# Patient Record
Sex: Male | Born: 1960 | Race: White | Hispanic: No | Marital: Single | Smoking: Former smoker
Health system: Southern US, Community
[De-identification: ages and names within clinical notes are randomized; demographics above are authoritative.]

## PROBLEM LIST (undated history)

## (undated) DIAGNOSIS — J45909 Unspecified asthma, uncomplicated: Secondary | ICD-10-CM

## (undated) DIAGNOSIS — D45 Polycythemia vera: Secondary | ICD-10-CM

## (undated) DIAGNOSIS — I10 Essential (primary) hypertension: Secondary | ICD-10-CM

## (undated) DIAGNOSIS — M48061 Spinal stenosis, lumbar region without neurogenic claudication: Secondary | ICD-10-CM

## (undated) DIAGNOSIS — E669 Obesity, unspecified: Secondary | ICD-10-CM

## (undated) DIAGNOSIS — E785 Hyperlipidemia, unspecified: Secondary | ICD-10-CM

## (undated) DIAGNOSIS — F32A Depression, unspecified: Secondary | ICD-10-CM

## (undated) DIAGNOSIS — R51 Headache: Secondary | ICD-10-CM

## (undated) DIAGNOSIS — G4733 Obstructive sleep apnea (adult) (pediatric): Secondary | ICD-10-CM

## (undated) DIAGNOSIS — F329 Major depressive disorder, single episode, unspecified: Secondary | ICD-10-CM

## (undated) HISTORY — DX: Headache: R51

## (undated) HISTORY — DX: Unspecified asthma, uncomplicated: J45.909

## (undated) HISTORY — DX: Essential (primary) hypertension: I10

## (undated) HISTORY — DX: Obesity, unspecified: E66.9

## (undated) HISTORY — DX: Hyperlipidemia, unspecified: E78.5

## (undated) HISTORY — DX: Obstructive sleep apnea (adult) (pediatric): G47.33

## (undated) HISTORY — DX: Polycythemia vera: D45

## (undated) HISTORY — DX: Major depressive disorder, single episode, unspecified: F32.9

## (undated) HISTORY — DX: Depression, unspecified: F32.A

## (undated) HISTORY — DX: Spinal stenosis, lumbar region without neurogenic claudication: M48.061

---

## 2000-08-31 ENCOUNTER — Encounter: Payer: Self-pay | Admitting: Family Medicine

## 2000-08-31 ENCOUNTER — Encounter: Admission: RE | Admit: 2000-08-31 | Discharge: 2000-08-31 | Payer: Self-pay | Admitting: Family Medicine

## 2001-03-17 ENCOUNTER — Encounter: Payer: Self-pay | Admitting: Occupational Medicine

## 2001-03-17 ENCOUNTER — Encounter: Admission: RE | Admit: 2001-03-17 | Discharge: 2001-03-17 | Payer: Self-pay | Admitting: Occupational Medicine

## 2001-04-27 ENCOUNTER — Encounter: Payer: Self-pay | Admitting: Orthopedic Surgery

## 2001-04-27 ENCOUNTER — Ambulatory Visit (HOSPITAL_COMMUNITY): Admission: RE | Admit: 2001-04-27 | Discharge: 2001-04-27 | Payer: Self-pay | Admitting: Orthopedic Surgery

## 2001-08-03 ENCOUNTER — Encounter: Payer: Self-pay | Admitting: Occupational Medicine

## 2001-08-03 ENCOUNTER — Encounter: Admission: RE | Admit: 2001-08-03 | Discharge: 2001-08-03 | Payer: Self-pay | Admitting: Occupational Medicine

## 2003-08-09 ENCOUNTER — Emergency Department (HOSPITAL_COMMUNITY): Admission: EM | Admit: 2003-08-09 | Discharge: 2003-08-09 | Payer: Self-pay | Admitting: Family Medicine

## 2003-12-22 ENCOUNTER — Emergency Department (HOSPITAL_COMMUNITY): Admission: EM | Admit: 2003-12-22 | Discharge: 2003-12-22 | Payer: Self-pay

## 2004-01-14 ENCOUNTER — Ambulatory Visit: Payer: Self-pay | Admitting: Internal Medicine

## 2004-01-30 ENCOUNTER — Ambulatory Visit: Payer: Self-pay | Admitting: Internal Medicine

## 2004-02-04 ENCOUNTER — Ambulatory Visit: Payer: Self-pay | Admitting: Internal Medicine

## 2004-02-04 ENCOUNTER — Encounter: Admission: RE | Admit: 2004-02-04 | Discharge: 2004-05-04 | Payer: Self-pay | Admitting: Infectious Diseases

## 2004-03-10 ENCOUNTER — Ambulatory Visit: Payer: Self-pay | Admitting: Internal Medicine

## 2004-03-13 ENCOUNTER — Ambulatory Visit (HOSPITAL_COMMUNITY): Admission: RE | Admit: 2004-03-13 | Discharge: 2004-03-13 | Payer: Self-pay | Admitting: *Deleted

## 2004-04-08 ENCOUNTER — Ambulatory Visit: Payer: Self-pay | Admitting: Internal Medicine

## 2004-04-22 ENCOUNTER — Encounter: Admission: RE | Admit: 2004-04-22 | Discharge: 2004-04-22 | Payer: Self-pay | Admitting: Internal Medicine

## 2004-05-21 ENCOUNTER — Ambulatory Visit: Payer: Self-pay | Admitting: Internal Medicine

## 2004-10-19 ENCOUNTER — Ambulatory Visit: Payer: Self-pay | Admitting: Hospitalist

## 2005-04-01 ENCOUNTER — Ambulatory Visit: Payer: Self-pay | Admitting: Hospitalist

## 2005-04-01 ENCOUNTER — Ambulatory Visit (HOSPITAL_COMMUNITY): Admission: RE | Admit: 2005-04-01 | Discharge: 2005-04-01 | Payer: Self-pay | Admitting: Hospitalist

## 2005-04-26 ENCOUNTER — Ambulatory Visit: Payer: Self-pay | Admitting: Internal Medicine

## 2005-08-06 ENCOUNTER — Ambulatory Visit: Payer: Self-pay | Admitting: Internal Medicine

## 2005-11-22 DIAGNOSIS — J45909 Unspecified asthma, uncomplicated: Secondary | ICD-10-CM | POA: Insufficient documentation

## 2005-11-22 DIAGNOSIS — Z862 Personal history of diseases of the blood and blood-forming organs and certain disorders involving the immune mechanism: Secondary | ICD-10-CM

## 2005-11-22 DIAGNOSIS — M542 Cervicalgia: Secondary | ICD-10-CM

## 2005-11-22 DIAGNOSIS — I10 Essential (primary) hypertension: Secondary | ICD-10-CM | POA: Insufficient documentation

## 2006-06-23 ENCOUNTER — Ambulatory Visit: Payer: Self-pay | Admitting: Internal Medicine

## 2006-06-23 ENCOUNTER — Encounter (INDEPENDENT_AMBULATORY_CARE_PROVIDER_SITE_OTHER): Payer: Self-pay | Admitting: *Deleted

## 2006-06-23 DIAGNOSIS — K219 Gastro-esophageal reflux disease without esophagitis: Secondary | ICD-10-CM

## 2006-07-29 ENCOUNTER — Ambulatory Visit: Payer: Self-pay | Admitting: *Deleted

## 2006-07-29 ENCOUNTER — Ambulatory Visit (HOSPITAL_COMMUNITY): Admission: RE | Admit: 2006-07-29 | Discharge: 2006-07-29 | Payer: Self-pay | Admitting: *Deleted

## 2006-07-29 ENCOUNTER — Encounter (INDEPENDENT_AMBULATORY_CARE_PROVIDER_SITE_OTHER): Payer: Self-pay | Admitting: *Deleted

## 2006-08-12 ENCOUNTER — Ambulatory Visit: Payer: Self-pay | Admitting: Internal Medicine

## 2006-08-12 DIAGNOSIS — M2460 Ankylosis, unspecified joint: Secondary | ICD-10-CM

## 2006-08-17 ENCOUNTER — Telehealth: Payer: Self-pay | Admitting: *Deleted

## 2006-08-31 ENCOUNTER — Encounter (INDEPENDENT_AMBULATORY_CARE_PROVIDER_SITE_OTHER): Payer: Self-pay | Admitting: *Deleted

## 2006-08-31 ENCOUNTER — Encounter: Admission: RE | Admit: 2006-08-31 | Discharge: 2006-08-31 | Payer: Self-pay | Admitting: Internal Medicine

## 2006-09-26 ENCOUNTER — Telehealth: Payer: Self-pay | Admitting: *Deleted

## 2006-09-26 ENCOUNTER — Encounter (INDEPENDENT_AMBULATORY_CARE_PROVIDER_SITE_OTHER): Payer: Self-pay | Admitting: *Deleted

## 2006-10-21 ENCOUNTER — Encounter (INDEPENDENT_AMBULATORY_CARE_PROVIDER_SITE_OTHER): Payer: Self-pay | Admitting: *Deleted

## 2006-11-10 ENCOUNTER — Telehealth (INDEPENDENT_AMBULATORY_CARE_PROVIDER_SITE_OTHER): Payer: Self-pay | Admitting: *Deleted

## 2006-11-11 ENCOUNTER — Encounter (INDEPENDENT_AMBULATORY_CARE_PROVIDER_SITE_OTHER): Payer: Self-pay | Admitting: *Deleted

## 2007-02-27 ENCOUNTER — Encounter (INDEPENDENT_AMBULATORY_CARE_PROVIDER_SITE_OTHER): Payer: Self-pay | Admitting: *Deleted

## 2007-02-27 ENCOUNTER — Ambulatory Visit: Payer: Self-pay | Admitting: Internal Medicine

## 2007-03-06 ENCOUNTER — Encounter: Payer: Self-pay | Admitting: Internal Medicine

## 2007-03-06 ENCOUNTER — Ambulatory Visit (HOSPITAL_COMMUNITY): Admission: RE | Admit: 2007-03-06 | Discharge: 2007-03-06 | Payer: Self-pay | Admitting: Hospitalist

## 2007-03-06 LAB — CONVERTED CEMR LAB
AST: 18 units/L (ref 0–37)
Albumin: 4.5 g/dL (ref 3.5–5.2)
Basophils Relative: 0 % (ref 0–1)
Calcium: 9.5 mg/dL (ref 8.4–10.5)
Creatinine, Urine: 229.8 mg/dL
Hemoglobin, Urine: NEGATIVE
Hemoglobin: 18.3 g/dL — ABNORMAL HIGH (ref 13.0–17.0)
Ketones, ur: NEGATIVE mg/dL
Leukocytes, UA: NEGATIVE
Lymphocytes Relative: 30 % (ref 12–46)
Microalb Creat Ratio: 4.4 mg/g (ref 0.0–30.0)
Monocytes Absolute: 0.5 10*3/uL (ref 0.1–1.0)
Monocytes Relative: 6 % (ref 3–12)
Neutro Abs: 4.6 10*3/uL (ref 1.7–7.7)
Neutrophils Relative %: 61 % (ref 43–77)
Nitrite: NEGATIVE
Platelets: 243 10*3/uL (ref 150–400)
Protein, ur: NEGATIVE mg/dL
RBC: 6.21 M/uL — ABNORMAL HIGH (ref 4.22–5.81)
RDW: 13.8 % (ref 11.5–15.5)
Specific Gravity, Urine: 1.022 (ref 1.005–1.03)
TSH: 0.677 microintl units/mL (ref 0.350–5.50)
Urine Glucose: NEGATIVE mg/dL
Urobilinogen, UA: 0.2 (ref 0.0–1.0)
WBC: 7.6 10*3/uL (ref 4.0–10.5)

## 2007-03-08 ENCOUNTER — Ambulatory Visit (HOSPITAL_BASED_OUTPATIENT_CLINIC_OR_DEPARTMENT_OTHER): Admission: RE | Admit: 2007-03-08 | Discharge: 2007-03-08 | Payer: Self-pay | Admitting: *Deleted

## 2007-03-11 ENCOUNTER — Ambulatory Visit: Payer: Self-pay | Admitting: Internal Medicine

## 2007-03-15 ENCOUNTER — Ambulatory Visit: Payer: Self-pay | Admitting: Internal Medicine

## 2007-04-03 ENCOUNTER — Encounter (INDEPENDENT_AMBULATORY_CARE_PROVIDER_SITE_OTHER): Payer: Self-pay | Admitting: *Deleted

## 2007-04-03 ENCOUNTER — Ambulatory Visit: Payer: Self-pay | Admitting: Hospitalist

## 2007-04-03 DIAGNOSIS — G4733 Obstructive sleep apnea (adult) (pediatric): Secondary | ICD-10-CM | POA: Insufficient documentation

## 2007-04-10 ENCOUNTER — Ambulatory Visit (HOSPITAL_BASED_OUTPATIENT_CLINIC_OR_DEPARTMENT_OTHER): Admission: RE | Admit: 2007-04-10 | Discharge: 2007-04-10 | Payer: Self-pay | Admitting: *Deleted

## 2007-04-11 ENCOUNTER — Telehealth: Payer: Self-pay | Admitting: *Deleted

## 2007-04-16 ENCOUNTER — Ambulatory Visit: Payer: Self-pay | Admitting: Internal Medicine

## 2007-04-18 ENCOUNTER — Ambulatory Visit: Payer: Self-pay | Admitting: Internal Medicine

## 2007-05-04 ENCOUNTER — Encounter (INDEPENDENT_AMBULATORY_CARE_PROVIDER_SITE_OTHER): Payer: Self-pay | Admitting: *Deleted

## 2007-05-11 ENCOUNTER — Encounter (INDEPENDENT_AMBULATORY_CARE_PROVIDER_SITE_OTHER): Payer: Self-pay | Admitting: *Deleted

## 2007-05-11 ENCOUNTER — Ambulatory Visit: Payer: Self-pay | Admitting: Infectious Disease

## 2007-05-11 DIAGNOSIS — F319 Bipolar disorder, unspecified: Secondary | ICD-10-CM | POA: Insufficient documentation

## 2007-05-26 ENCOUNTER — Ambulatory Visit: Payer: Self-pay | Admitting: Infectious Disease

## 2007-05-26 DIAGNOSIS — T148XXA Other injury of unspecified body region, initial encounter: Secondary | ICD-10-CM | POA: Insufficient documentation

## 2007-06-07 ENCOUNTER — Encounter (INDEPENDENT_AMBULATORY_CARE_PROVIDER_SITE_OTHER): Payer: Self-pay | Admitting: *Deleted

## 2007-06-07 ENCOUNTER — Ambulatory Visit: Payer: Self-pay | Admitting: *Deleted

## 2007-06-07 DIAGNOSIS — R42 Dizziness and giddiness: Secondary | ICD-10-CM | POA: Insufficient documentation

## 2007-06-07 LAB — CONVERTED CEMR LAB
Basophils Relative: 0 % (ref 0–1)
Eosinophils Relative: 3 % (ref 0–5)
HCT: 51.7 % (ref 39.0–52.0)
Lymphocytes Relative: 28 % (ref 12–46)
Lymphs Abs: 2.2 10*3/uL (ref 0.7–4.0)
MCV: 87.2 fL (ref 78.0–100.0)
Monocytes Relative: 8 % (ref 3–12)
Neutrophils Relative %: 61 % (ref 43–77)
RDW: 13.9 % (ref 11.5–15.5)
WBC: 7.9 10*3/uL (ref 4.0–10.5)

## 2007-06-15 ENCOUNTER — Encounter: Admission: RE | Admit: 2007-06-15 | Discharge: 2007-07-12 | Payer: Self-pay | Admitting: *Deleted

## 2007-06-26 ENCOUNTER — Encounter (INDEPENDENT_AMBULATORY_CARE_PROVIDER_SITE_OTHER): Payer: Self-pay | Admitting: *Deleted

## 2007-06-29 ENCOUNTER — Encounter (INDEPENDENT_AMBULATORY_CARE_PROVIDER_SITE_OTHER): Payer: Self-pay | Admitting: *Deleted

## 2007-07-06 ENCOUNTER — Ambulatory Visit: Payer: Self-pay | Admitting: Internal Medicine

## 2007-07-06 ENCOUNTER — Encounter (INDEPENDENT_AMBULATORY_CARE_PROVIDER_SITE_OTHER): Payer: Self-pay | Admitting: *Deleted

## 2007-07-12 ENCOUNTER — Encounter (INDEPENDENT_AMBULATORY_CARE_PROVIDER_SITE_OTHER): Payer: Self-pay | Admitting: Internal Medicine

## 2007-08-17 ENCOUNTER — Telehealth: Payer: Self-pay | Admitting: *Deleted

## 2007-08-29 ENCOUNTER — Encounter: Payer: Self-pay | Admitting: Internal Medicine

## 2007-08-29 ENCOUNTER — Ambulatory Visit: Payer: Self-pay | Admitting: Internal Medicine

## 2007-08-29 DIAGNOSIS — R300 Dysuria: Secondary | ICD-10-CM | POA: Insufficient documentation

## 2007-08-29 LAB — CONVERTED CEMR LAB
Bilirubin Urine: NEGATIVE
Chlamydia, Swab/Urine, PCR: NEGATIVE
Nitrite: NEGATIVE
Protein, ur: NEGATIVE mg/dL

## 2007-09-12 ENCOUNTER — Encounter: Payer: Self-pay | Admitting: Internal Medicine

## 2007-09-12 ENCOUNTER — Ambulatory Visit: Payer: Self-pay | Admitting: Internal Medicine

## 2007-09-12 LAB — CONVERTED CEMR LAB
Bilirubin Urine: NEGATIVE
Ketones, ur: NEGATIVE mg/dL
Leukocytes, UA: NEGATIVE
Protein, ur: NEGATIVE mg/dL
Specific Gravity, Urine: 1.014 (ref 1.005–1.03)
pH: 7.5 (ref 5.0–8.0)

## 2007-09-13 ENCOUNTER — Encounter: Payer: Self-pay | Admitting: Internal Medicine

## 2007-09-19 ENCOUNTER — Encounter (INDEPENDENT_AMBULATORY_CARE_PROVIDER_SITE_OTHER): Payer: Self-pay | Admitting: *Deleted

## 2007-09-26 ENCOUNTER — Ambulatory Visit: Payer: Self-pay | Admitting: Internal Medicine

## 2007-09-28 ENCOUNTER — Emergency Department (HOSPITAL_BASED_OUTPATIENT_CLINIC_OR_DEPARTMENT_OTHER): Admission: EM | Admit: 2007-09-28 | Discharge: 2007-09-29 | Payer: Self-pay | Admitting: Emergency Medicine

## 2007-09-29 ENCOUNTER — Encounter (INDEPENDENT_AMBULATORY_CARE_PROVIDER_SITE_OTHER): Payer: Self-pay | Admitting: *Deleted

## 2007-10-02 ENCOUNTER — Telehealth (INDEPENDENT_AMBULATORY_CARE_PROVIDER_SITE_OTHER): Payer: Self-pay | Admitting: *Deleted

## 2007-10-18 ENCOUNTER — Ambulatory Visit: Payer: Self-pay | Admitting: *Deleted

## 2007-10-18 ENCOUNTER — Ambulatory Visit (HOSPITAL_COMMUNITY): Admission: RE | Admit: 2007-10-18 | Discharge: 2007-10-18 | Payer: Self-pay | Admitting: Infectious Disease

## 2007-11-21 ENCOUNTER — Ambulatory Visit: Payer: Self-pay | Admitting: *Deleted

## 2007-11-21 DIAGNOSIS — R1011 Right upper quadrant pain: Secondary | ICD-10-CM

## 2008-01-09 ENCOUNTER — Encounter (INDEPENDENT_AMBULATORY_CARE_PROVIDER_SITE_OTHER): Payer: Self-pay | Admitting: *Deleted

## 2008-02-27 ENCOUNTER — Encounter (INDEPENDENT_AMBULATORY_CARE_PROVIDER_SITE_OTHER): Payer: Self-pay | Admitting: *Deleted

## 2008-02-27 ENCOUNTER — Ambulatory Visit: Payer: Self-pay | Admitting: Internal Medicine

## 2008-02-27 ENCOUNTER — Observation Stay (HOSPITAL_COMMUNITY): Admission: AD | Admit: 2008-02-27 | Discharge: 2008-02-29 | Payer: Self-pay | Admitting: Infectious Disease

## 2008-02-27 ENCOUNTER — Encounter (INDEPENDENT_AMBULATORY_CARE_PROVIDER_SITE_OTHER): Payer: Self-pay | Admitting: Internal Medicine

## 2008-02-27 ENCOUNTER — Ambulatory Visit: Payer: Self-pay | Admitting: Infectious Disease

## 2008-02-27 DIAGNOSIS — R079 Chest pain, unspecified: Secondary | ICD-10-CM

## 2008-02-27 DIAGNOSIS — M79609 Pain in unspecified limb: Secondary | ICD-10-CM

## 2008-02-28 ENCOUNTER — Encounter (INDEPENDENT_AMBULATORY_CARE_PROVIDER_SITE_OTHER): Payer: Self-pay | Admitting: *Deleted

## 2008-02-28 ENCOUNTER — Encounter: Payer: Self-pay | Admitting: *Deleted

## 2008-02-28 ENCOUNTER — Ambulatory Visit: Payer: Self-pay | Admitting: Vascular Surgery

## 2008-02-28 ENCOUNTER — Encounter: Payer: Self-pay | Admitting: Infectious Disease

## 2008-02-28 LAB — CONVERTED CEMR LAB
HDL: 27 mg/dL
LDL Cholesterol: 151 mg/dL
Triglycerides: 127 mg/dL

## 2008-02-29 ENCOUNTER — Encounter (INDEPENDENT_AMBULATORY_CARE_PROVIDER_SITE_OTHER): Payer: Self-pay | Admitting: *Deleted

## 2008-03-08 ENCOUNTER — Encounter: Payer: Self-pay | Admitting: *Deleted

## 2008-03-14 ENCOUNTER — Encounter (INDEPENDENT_AMBULATORY_CARE_PROVIDER_SITE_OTHER): Payer: Self-pay | Admitting: *Deleted

## 2008-03-14 ENCOUNTER — Ambulatory Visit: Payer: Self-pay | Admitting: *Deleted

## 2008-03-14 DIAGNOSIS — E785 Hyperlipidemia, unspecified: Secondary | ICD-10-CM | POA: Insufficient documentation

## 2008-03-15 LAB — CONVERTED CEMR LAB
AST: 24 units/L (ref 0–37)
Albumin: 4.2 g/dL (ref 3.5–5.2)
Alkaline Phosphatase: 113 units/L (ref 39–117)
CO2: 23 meq/L (ref 19–32)
Creatinine, Ser: 0.89 mg/dL (ref 0.40–1.50)
Free T4: 1.02 ng/dL (ref 0.89–1.80)
Potassium: 4.3 meq/L (ref 3.5–5.3)
RDW: 13.9 % (ref 11.5–15.5)
Total Bilirubin: 0.8 mg/dL (ref 0.3–1.2)
Total Protein: 6.8 g/dL (ref 6.0–8.3)
WBC: 8.2 10*3/uL (ref 4.0–10.5)

## 2008-03-26 ENCOUNTER — Encounter: Admission: RE | Admit: 2008-03-26 | Discharge: 2008-06-06 | Payer: Self-pay | Admitting: *Deleted

## 2008-03-31 ENCOUNTER — Emergency Department (HOSPITAL_BASED_OUTPATIENT_CLINIC_OR_DEPARTMENT_OTHER): Admission: EM | Admit: 2008-03-31 | Discharge: 2008-03-31 | Payer: Self-pay | Admitting: Emergency Medicine

## 2008-04-04 ENCOUNTER — Encounter (INDEPENDENT_AMBULATORY_CARE_PROVIDER_SITE_OTHER): Payer: Self-pay | Admitting: *Deleted

## 2008-04-24 ENCOUNTER — Ambulatory Visit: Payer: Self-pay | Admitting: *Deleted

## 2008-05-01 ENCOUNTER — Ambulatory Visit: Payer: Self-pay | Admitting: Radiology

## 2008-05-01 ENCOUNTER — Emergency Department (HOSPITAL_BASED_OUTPATIENT_CLINIC_OR_DEPARTMENT_OTHER): Admission: EM | Admit: 2008-05-01 | Discharge: 2008-05-01 | Payer: Self-pay | Admitting: Emergency Medicine

## 2008-05-09 ENCOUNTER — Ambulatory Visit: Payer: Self-pay | Admitting: Infectious Disease

## 2008-05-09 DIAGNOSIS — R7989 Other specified abnormal findings of blood chemistry: Secondary | ICD-10-CM | POA: Insufficient documentation

## 2008-05-20 ENCOUNTER — Encounter (INDEPENDENT_AMBULATORY_CARE_PROVIDER_SITE_OTHER): Payer: Self-pay | Admitting: *Deleted

## 2008-05-24 ENCOUNTER — Ambulatory Visit: Payer: Self-pay | Admitting: Internal Medicine

## 2008-05-27 ENCOUNTER — Ambulatory Visit: Payer: Self-pay | Admitting: Infectious Disease

## 2008-05-27 ENCOUNTER — Encounter (INDEPENDENT_AMBULATORY_CARE_PROVIDER_SITE_OTHER): Payer: Self-pay | Admitting: Internal Medicine

## 2008-05-27 LAB — CONVERTED CEMR LAB
ALT: 25 units/L (ref 0–53)
AST: 17 units/L (ref 0–37)
Albumin: 4 g/dL (ref 3.5–5.2)
Alkaline Phosphatase: 118 units/L — ABNORMAL HIGH (ref 39–117)
BUN: 11 mg/dL (ref 6–23)
CO2: 25 meq/L (ref 19–32)
Calcium: 9.1 mg/dL (ref 8.4–10.5)
Chloride: 106 meq/L (ref 96–112)
Cholesterol: 130 mg/dL (ref 0–200)
Creatinine, Ser: 1.1 mg/dL (ref 0.40–1.50)
GFR calc Af Amer: >60 mL/min (ref >60)
GFR calc non Af Amer: >60 mL/min (ref >60)
Glucose, Bld: 100 mg/dL — ABNORMAL HIGH (ref 70–99)
HDL: 32 mg/dL — ABNORMAL LOW (ref >39)
LDL Cholesterol: 75 mg/dL (ref 0–99)
Potassium: 4.2 meq/L (ref 3.5–5.3)
Sodium: 142 meq/L (ref 135–145)
Total Bilirubin: 0.8 mg/dL (ref 0.3–1.2)
Total CHOL/HDL Ratio: 4.1
Total Protein: 6.6 g/dL (ref 6.0–8.3)
Triglycerides: 117 mg/dL (ref <150)
Uric Acid, Serum: 5.3 mg/dL (ref 4.0–7.8)
VLDL: 23 mg/dL (ref 0–40)

## 2008-05-28 ENCOUNTER — Encounter (INDEPENDENT_AMBULATORY_CARE_PROVIDER_SITE_OTHER): Payer: Self-pay | Admitting: Internal Medicine

## 2008-08-16 ENCOUNTER — Emergency Department (HOSPITAL_BASED_OUTPATIENT_CLINIC_OR_DEPARTMENT_OTHER): Admission: EM | Admit: 2008-08-16 | Discharge: 2008-08-16 | Payer: Self-pay | Admitting: Emergency Medicine

## 2008-10-14 ENCOUNTER — Ambulatory Visit: Payer: Self-pay | Admitting: Internal Medicine

## 2008-10-15 ENCOUNTER — Encounter: Admission: RE | Admit: 2008-10-15 | Discharge: 2008-10-15 | Payer: Self-pay | Admitting: Internal Medicine

## 2008-10-17 DIAGNOSIS — M48061 Spinal stenosis, lumbar region without neurogenic claudication: Secondary | ICD-10-CM

## 2008-10-22 ENCOUNTER — Encounter: Payer: Self-pay | Admitting: Internal Medicine

## 2008-11-12 ENCOUNTER — Encounter: Payer: Self-pay | Admitting: Internal Medicine

## 2008-11-13 ENCOUNTER — Encounter: Payer: Self-pay | Admitting: Internal Medicine

## 2008-12-11 ENCOUNTER — Encounter
Admission: RE | Admit: 2008-12-11 | Discharge: 2009-01-07 | Payer: Self-pay | Admitting: Physical Medicine & Rehabilitation

## 2008-12-12 ENCOUNTER — Ambulatory Visit: Payer: Self-pay | Admitting: Physical Medicine & Rehabilitation

## 2008-12-18 ENCOUNTER — Ambulatory Visit: Payer: Self-pay | Admitting: Internal Medicine

## 2008-12-18 DIAGNOSIS — R109 Unspecified abdominal pain: Secondary | ICD-10-CM | POA: Insufficient documentation

## 2008-12-18 LAB — CONVERTED CEMR LAB
ALT: 38 units/L (ref 0–53)
AST: 23 units/L (ref 0–37)
BUN: 11 mg/dL (ref 6–23)
CO2: 25 meq/L (ref 19–32)
Chloride: 104 meq/L (ref 96–112)
Glucose, Bld: 111 mg/dL — ABNORMAL HIGH (ref 70–99)
Hemoglobin, Urine: NEGATIVE
Ketones, ur: NEGATIVE mg/dL
LDL Cholesterol: 84 mg/dL (ref 0–99)
Leukocytes, UA: NEGATIVE
Nitrite: NEGATIVE
Potassium: 3.9 meq/L (ref 3.5–5.3)
Sodium: 140 meq/L (ref 135–145)
Specific Gravity, Urine: 1.014 (ref 1.005–1.0)
Total CHOL/HDL Ratio: 4.2
Total Protein: 7.1 g/dL (ref 6.0–8.3)
Triglycerides: 168 mg/dL — ABNORMAL HIGH (ref <150)
pH: 5 (ref 5.0–8.0)

## 2008-12-19 ENCOUNTER — Encounter: Payer: Self-pay | Admitting: Internal Medicine

## 2008-12-24 ENCOUNTER — Encounter
Admission: RE | Admit: 2008-12-24 | Discharge: 2009-01-09 | Payer: Self-pay | Admitting: Physical Medicine & Rehabilitation

## 2009-01-11 HISTORY — PX: COLONOSCOPY: SHX174

## 2009-01-13 ENCOUNTER — Encounter
Admission: RE | Admit: 2009-01-13 | Discharge: 2009-01-31 | Payer: Self-pay | Admitting: Physical Medicine & Rehabilitation

## 2009-02-10 ENCOUNTER — Encounter
Admission: RE | Admit: 2009-02-10 | Discharge: 2009-05-09 | Payer: Self-pay | Admitting: Physical Medicine & Rehabilitation

## 2009-02-11 ENCOUNTER — Ambulatory Visit: Payer: Self-pay | Admitting: Physical Medicine & Rehabilitation

## 2009-03-13 ENCOUNTER — Ambulatory Visit: Payer: Self-pay | Admitting: Internal Medicine

## 2009-03-17 ENCOUNTER — Encounter: Payer: Self-pay | Admitting: Internal Medicine

## 2009-03-20 ENCOUNTER — Telehealth: Payer: Self-pay | Admitting: Internal Medicine

## 2009-05-09 ENCOUNTER — Ambulatory Visit: Payer: Self-pay | Admitting: Physical Medicine & Rehabilitation

## 2009-05-13 ENCOUNTER — Ambulatory Visit: Payer: Self-pay | Admitting: Infectious Disease

## 2009-05-13 ENCOUNTER — Telehealth: Payer: Self-pay | Admitting: *Deleted

## 2009-05-13 LAB — CONVERTED CEMR LAB
ALT: 34 units/L (ref 0–53)
AST: 23 units/L (ref 0–37)
Albumin: 4.1 g/dL (ref 3.5–5.2)
Alkaline Phosphatase: 113 units/L (ref 39–117)
BUN: 9 mg/dL (ref 6–23)
CO2: 24 meq/L (ref 19–32)
Calcium: 9.1 mg/dL (ref 8.4–10.5)
Chloride: 105 meq/L (ref 96–112)
Cholesterol: 127 mg/dL (ref 0–200)
Creatinine, Ser: 1.1 mg/dL (ref 0.40–1.50)
Glucose, Bld: 101 mg/dL — ABNORMAL HIGH (ref 70–99)
HCT: 49.1 % (ref 39.0–52.0)
HDL: 35 mg/dL — ABNORMAL LOW (ref >39)
Hemoglobin: 16.9 g/dL (ref 13.0–17.0)
LDL Cholesterol: 68 mg/dL (ref 0–99)
MCHC: 34.4 g/dL (ref 30.0–36.0)
MCV: 86.1 fL (ref >=78.0)
Platelets: 222 10*3/uL (ref 150–400)
Potassium: 3.7 meq/L (ref 3.5–5.3)
RBC: 5.7 M/uL (ref 4.22–5.81)
RDW: 14.6 % (ref 11.5–15.5)
Sodium: 140 meq/L (ref 135–145)
Total Bilirubin: 0.7 mg/dL (ref 0.3–1.2)
Total CHOL/HDL Ratio: 3.6
Total Protein: 6.5 g/dL (ref 6.0–8.3)
Triglycerides: 119 mg/dL (ref <150)
VLDL: 24 mg/dL (ref 0–40)
WBC: 8.3 10*3/uL (ref 4.0–10.5)

## 2009-05-19 ENCOUNTER — Telehealth: Payer: Self-pay | Admitting: Internal Medicine

## 2009-05-27 ENCOUNTER — Encounter: Payer: Self-pay | Admitting: Internal Medicine

## 2009-05-28 ENCOUNTER — Encounter: Admission: RE | Admit: 2009-05-28 | Discharge: 2009-05-28 | Payer: Self-pay | Admitting: Gastroenterology

## 2009-05-28 ENCOUNTER — Encounter: Payer: Self-pay | Admitting: Internal Medicine

## 2009-06-12 ENCOUNTER — Encounter: Payer: Self-pay | Admitting: Internal Medicine

## 2009-06-30 IMAGING — CR DG CHEST 2V
2 series · 2 of 2 positions shown · non-contrast
Comparison: 03/15/2007

CLINICAL DATA: Left sided numbness

CHEST - 2 VIEW

[w chest pa]
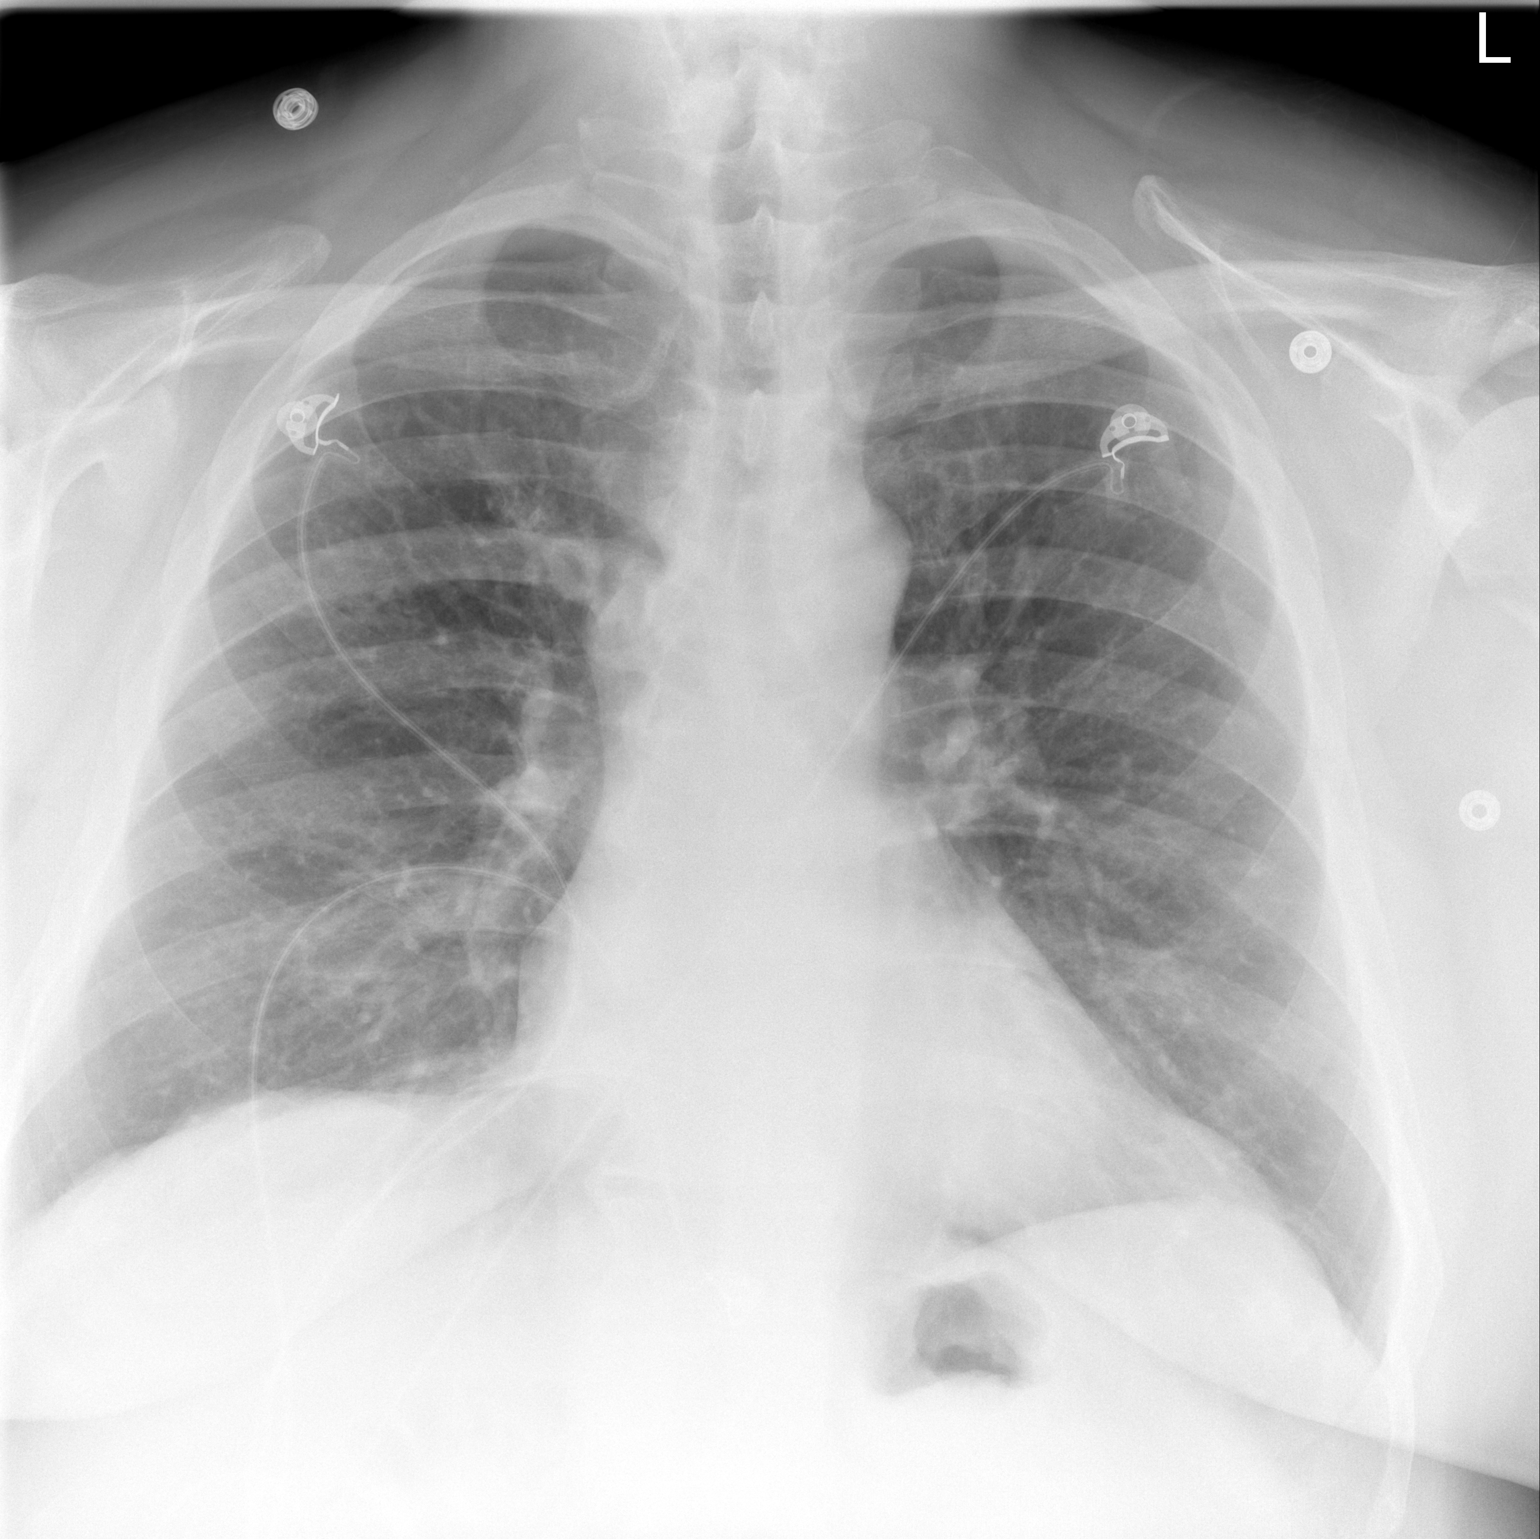

[w chest lat]
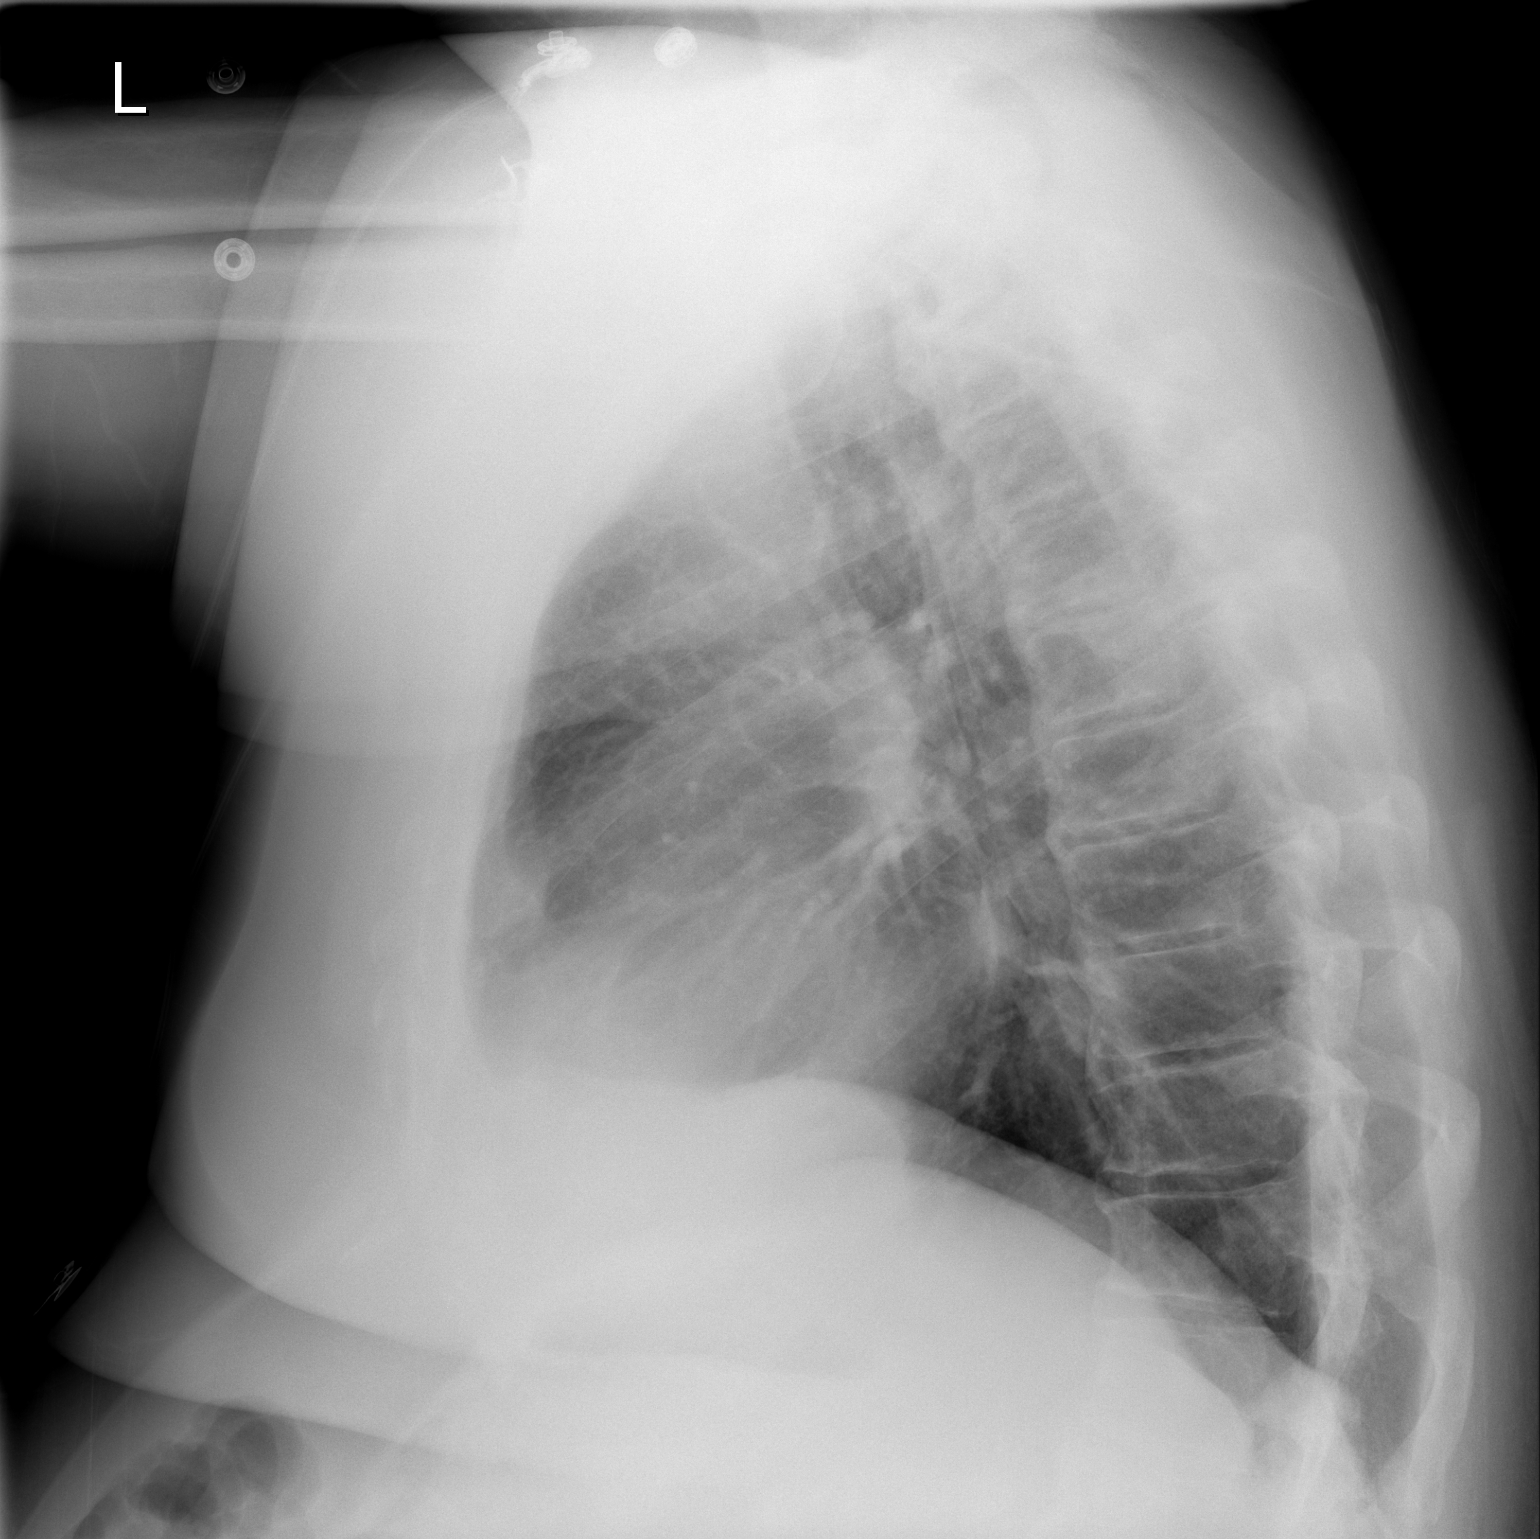

[2 of 2 positions shown; findings below may reference images not displayed]

FINDINGS: The heart size and mediastinal contours are within normal
limits.  Both lungs are clear.  The visualized skeletal structures
are unremarkable.
IMPRESSION: 1.  No acute findings.

## 2009-07-02 ENCOUNTER — Encounter
Admission: RE | Admit: 2009-07-02 | Discharge: 2009-09-30 | Payer: Self-pay | Admitting: Physical Medicine & Rehabilitation

## 2009-07-08 ENCOUNTER — Ambulatory Visit: Payer: Self-pay | Admitting: Physical Medicine & Rehabilitation

## 2009-07-11 HISTORY — PX: CHOLECYSTECTOMY: SHX55

## 2009-07-28 ENCOUNTER — Ambulatory Visit: Payer: Self-pay | Admitting: Internal Medicine

## 2009-07-29 ENCOUNTER — Telehealth: Payer: Self-pay | Admitting: Internal Medicine

## 2009-07-31 ENCOUNTER — Encounter (INDEPENDENT_AMBULATORY_CARE_PROVIDER_SITE_OTHER): Payer: Self-pay | Admitting: Surgery

## 2009-07-31 ENCOUNTER — Ambulatory Visit (HOSPITAL_COMMUNITY): Admission: RE | Admit: 2009-07-31 | Discharge: 2009-08-01 | Payer: Self-pay | Admitting: Surgery

## 2009-08-20 ENCOUNTER — Encounter: Payer: Self-pay | Admitting: Internal Medicine

## 2009-09-02 ENCOUNTER — Ambulatory Visit: Payer: Self-pay | Admitting: Physical Medicine & Rehabilitation

## 2009-09-29 ENCOUNTER — Telehealth (INDEPENDENT_AMBULATORY_CARE_PROVIDER_SITE_OTHER): Payer: Self-pay | Admitting: *Deleted

## 2009-10-29 ENCOUNTER — Ambulatory Visit: Payer: Self-pay | Admitting: Internal Medicine

## 2010-01-06 ENCOUNTER — Telehealth: Payer: Self-pay | Admitting: Internal Medicine

## 2010-01-12 ENCOUNTER — Encounter
Admission: RE | Admit: 2010-01-12 | Discharge: 2010-02-10 | Payer: Self-pay | Source: Home / Self Care | Attending: Physical Medicine & Rehabilitation | Admitting: Physical Medicine & Rehabilitation

## 2010-01-13 ENCOUNTER — Ambulatory Visit
Admission: RE | Admit: 2010-01-13 | Discharge: 2010-01-13 | Payer: Self-pay | Source: Home / Self Care | Attending: Physical Medicine & Rehabilitation | Admitting: Physical Medicine & Rehabilitation

## 2010-01-20 ENCOUNTER — Encounter
Admission: RE | Admit: 2010-01-20 | Discharge: 2010-01-20 | Payer: Self-pay | Source: Home / Self Care | Attending: Physical Medicine & Rehabilitation | Admitting: Physical Medicine & Rehabilitation

## 2010-02-01 ENCOUNTER — Encounter: Payer: Self-pay | Admitting: Internal Medicine

## 2010-02-01 IMAGING — CR DG CHEST 1V PORT
1 series · 1 of 1 positions shown · non-contrast
Comparison: 02/27/2008

CLINICAL DATA: Chest pain/short of breath

PORTABLE CHEST - 1 VIEW

[view not recorded]
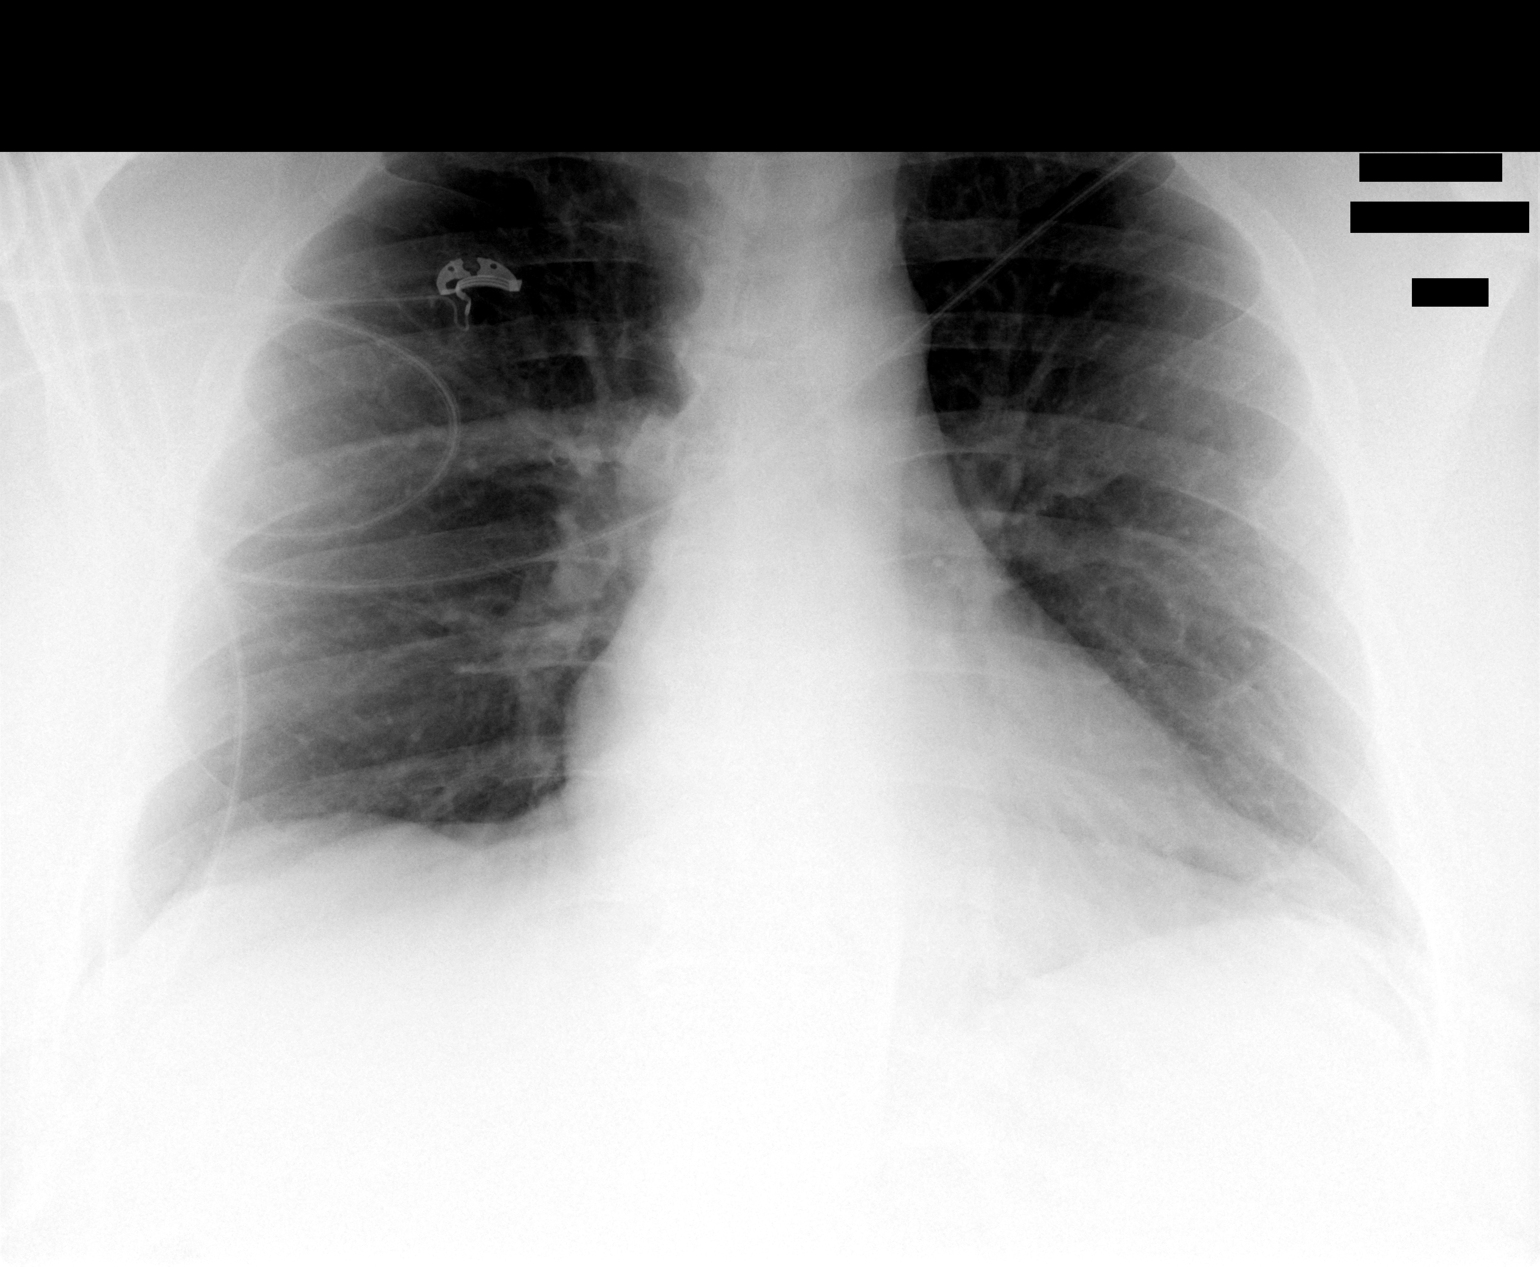

[1 of 1 positions shown; findings below may reference images not displayed]

FINDINGS: Heart size within normal limits for AP projection.  No
congestive heart failure or active disease in one-view.
IMPRESSION: No active disease.

## 2010-02-06 ENCOUNTER — Telehealth: Payer: Self-pay | Admitting: Internal Medicine

## 2010-02-08 LAB — CONVERTED CEMR LAB
ALT: 36 units/L (ref 0–53)
ALT: 40 units/L (ref 0–53)
AST: 23 units/L (ref 0–37)
Albumin: 3.9 g/dL (ref 3.5–5.2)
Alkaline Phosphatase: 103 units/L (ref 39–117)
BUN: 11 mg/dL (ref 6–23)
BUN: 12 mg/dL (ref 6–23)
Basophils Absolute: 0 10*3/uL (ref 0.0–0.1)
CO2: 24 meq/L (ref 19–32)
Calcium: 9.2 mg/dL (ref 8.4–10.5)
Chloride: 105 meq/L (ref 96–112)
Creatinine, Ser: 1.14 mg/dL (ref 0.40–1.50)
Creatinine, Ser: 1.16 mg/dL (ref 0.40–1.50)
Eosinophils Absolute: 0.1 10*3/uL (ref 0.0–0.7)
GFR calc non Af Amer: >60 mL/min (ref >60)
Glucose, Bld: 91 mg/dL (ref 70–99)
Glucose, Bld: 93 mg/dL (ref 70–99)
HCT: 46.8 % (ref 39.0–52.0)
HCT: 51.7 % (ref 39.0–52.0)
Hemoglobin: 17.6 g/dL — ABNORMAL HIGH (ref 13.0–17.0)
MCHC: 34 g/dL (ref 30.0–36.0)
MCHC: 35.7 g/dL (ref 30.0–36.0)
MCV: 83.4 fL (ref 78.0–100.0)
Monocytes Relative: 9 % (ref 3–12)
Platelets: 213 10*3/uL (ref 150–400)
Potassium: 4 meq/L (ref 3.5–5.3)
RBC: 5.61 M/uL (ref 4.22–5.81)
RBC: 5.96 M/uL — ABNORMAL HIGH (ref 4.22–5.81)
Sodium: 140 meq/L (ref 135–145)
Sodium: 141 meq/L (ref 135–145)
Total Bilirubin: 0.8 mg/dL (ref 0.3–1.2)
Total Bilirubin: 1.1 mg/dL (ref 0.3–1.2)
WBC: 6.7 10*3/uL (ref 4.0–10.5)

## 2010-02-10 NOTE — Progress Notes (Signed)
Summary: Surgially clear??  Phone Note From Other Clinic   Caller: [facility] Short Stay-[facility] Call For: Izzabella Besse Summary of Call: Need to know if pt has been cleared for Surgery - Thursday July 21?  Fax [phone]  Ph [phone] Initial call taken by: Eugene Gavia,  July 29, 2009 10:42 AM  Follow-up for Phone Call        faxed over last ov to short stay to tonya---called ansd spoke with tonya and she is aware of fax coming through Randell Loop CMA  July 29, 2009 10:58 AM

## 2010-02-10 NOTE — Procedures (Signed)
[facility]: [facility]   Imported By: Margie Billet 06/19/2009 10:54:30  _____________________________________________________________________  External Attachment:    Type:   Image     Comment:   External Document  Appended Document: [facility]    Clinical Lists Changes        Problem # 13:  ABDOMINAL PAIN, UNSPECIFIED SITE (ICD-789.00) please refer to prior office visit done. Evaluated by Dr. Matthias Hughs....likely symptomatic cholelithiasis without cholecystitis....recommended surgery.... pt agreeable...Marland KitchenMarland Kitchen surgical referral.  Complete Medication List: 1)  Albuterol 90 Mcg/act Aers (Albuterol) .... 2 puffs every 6 hours as needed for shortness of breath 2)  Zocor 40 Mg Tabs (Simvastatin) .... .qdtab 3)  Aspirin Adult Low Strength 81 Mg Tbec (Aspirin) .... Take 1 tablet by mouth once a day 4)  Coreg 12.5 Mg Tabs (Carvedilol) .... Take 1 tablet by mouth two times a day 5)  Furosemide 20 Mg Tabs (Furosemide) .... Take 1/2  tablet by mouth once a day 6)  Tramadol Hcl 50 Mg Tabs (Tramadol hcl) .... Take 1 tablet by mouth three times a day as needed pain 7)  Protonix 40 Mg Tbec (Pantoprazole sodium) .... Take 1 tablet by mouth once a day

## 2010-02-10 NOTE — Progress Notes (Signed)
Summary: labs/ hla  Phone Note Other Incoming   Summary of Call: pt presented to lab w/ orders from [facility], lab drew them and held them, she also entered them as orders from [place], they are: cbc, lipids and cmet. is this ok w/ you?  Initial call taken by: Marin Roberts RN,  May 13, 2009 12:29 PM  Follow-up for Phone Call        Sure thats fine, though he had CMET and Lipids 12/2008 but no CBC.  If they ordered it that would be fine Follow-up by: Mariea Stable MD,  May 13, 2009 10:21 PM  New Problems: ENCOUNTER FOR LONG-TERM USE OF OTHER MEDICATIONS (ICD-V58.69)   New Problems: ENCOUNTER FOR LONG-TERM USE OF OTHER MEDICATIONS (ICD-V58.69)  Process Orders Check Orders Results:     [facility]: Check successful Tests Sent for requisitioning (May 16, 2009 5:16 PM):     05/13/2009: [facility] -- T-Comprehensive Metabolic Panel [[id number]] (signed)     05/13/2009: [facility] -- T-Lipid Profile [phone] (signed)     05/13/2009: [facility] -- T-CBC No Diff [[id number]] (signed)

## 2010-02-10 NOTE — Assessment & Plan Note (Signed)
Summary: check up [mkj]   Vital Signs:  Patient profile:   50 year old male Height:      74.9 inches (190.25 cm) Weight:      356.02 pounds (161.83 kg) BMI:     44.78 Temp:     98 degrees F (36.67 degrees C) oral Pulse rate:   70 / minute BP sitting:   131 / 70  (right arm) Cuff size:   large  Vitals Entered By: Angelina Ok RN (March 13, 2009 3:45 PM) CC: Depression Is Patient Diabetic? No Pain Assessment Patient in pain? yes     Location: back Intensity: 4 Type: aching Onset of pain  Constant Nutritional Status BMI of > 30 = obese  Have you ever been in a relationship where you felt threatened, hurt or afraid?No   Does patient need assistance? Functional Status Self care Ambulation Normal Comments Had a fever earlier in the week.  101.1.- 100.3  Asthma medication make him go bonkers.  Barricades self in house.  Mid abdominal pain.  Check up.   Primary Care Provider:  Mariea Stable MD  CC:  Depression.  History of Present Illness: Eduardo Duncan is a 50 yo man with PMH as outlined below.  He is here for routine f/u.  Since our last visit he has seen Dr. Venetia Maxon for spinal stenosis who recomended injections and referred to colleague Dr. Edison Simon.  However, decision to hold off b/c of prior psych reactions to both oral and inhaled steroids.  Overall doing ok and continues to follow with them.  As for his BP, we changed his coreg to two times a day and restarted lasix @ 20mg  which he then reduced to 10mg  b/c urination.  Saw Dr. Scot Dock on last visits and UA clean, though treated empirically. He continues to have abdominal pain, which he has had since last visit.  It is RUQ radiating to epigastrum and occasionally to LUQ.  Burning, worse with sitting and especially after eating.  Wonders if it is related to GERD as he has taken PPIs in past.  Depression History:      The patient is having a depressed mood most of the day but denies diminished interest in his usual daily  activities.        The patient denies that he feels like life is not worth living, denies that he wishes that he were dead, and denies that he has thought about ending his life.        Comments:  is better now.   Preventive Screening-Counseling & Management  Alcohol-Tobacco     Smoking Status: quit     Year Quit: 88yrs ago     Pack years: 1ppd x 12 yrs.  Current Medications (verified): 1)  Albuterol 90 Mcg/act  Aers (Albuterol) .... 2 Puffs Every 6 Hours As Needed For Shortness of Breath 2)  Zocor 40 Mg Tabs (Simvastatin) .... .qdtab 3)  Aspirin Adult Low Strength 81 Mg Tbec (Aspirin) .... Take 1 Tablet By Mouth Once A Day 4)  Coreg 12.5 Mg Tabs (Carvedilol) .... Take 1 Tablet By Mouth Two Times A Day 5)  Furosemide 20 Mg Tabs (Furosemide) .... Take 1/2  Tablet By Mouth Once A Day 6)  Tramadol Hcl 50 Mg Tabs (Tramadol Hcl) .... Take 1 Tablet By Mouth Three Times A Day As Needed Pain  Allergies (verified): 1)  ! * Singulair,advair 2)  Codeine  Past History:  Family History: Last updated: 03/13/2009 no asthma in fm seasonal  allergies mother and siblings father- MI at 43 yo Brother had double bypass recently, in his 70's Brother with colon cancer age 30 Mother has angina.   Social History: Last updated: 02/27/2008 On disability benefit.  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 Venetia Maxon from [facility] specialists:           -Recommended that he undergo lumbar spinal epidural injections and pain management treatment as he did            not   think the patient was a surgical candidate due to asthma and  general comorbidities.  He is to f/u on                as needed   basis and recommended seeing Dr. Wynn Banker for pain managment and injection therapy            (decision to hold off 2/2 psych reactions with steroids).  ??Polycythemia vera Dizziness (chronic) Prostatitis 2005, ? UTI in his 24s ??gout (no synovial fluid analysis)  Family History: no asthma in fm seasonal allergies mother and siblings father- MI at 35 yo Brother had double bypass recently, in his 102's Brother with colon cancer age 51 Mother has angina.   Review of Systems      See HPI  Physical Exam  General:  obese, alert and cooperative to examination.   Eyes:  anicteric Lungs:  normal respiratory effort and no accessory muscle use.   Neurologic:  alert & oriented X3, cranial nerves II-XII intact, strength normal in all extremities, sensation intact to light touch, and gait normal.   Psych:  Oriented X3, memory intact for recent and remote, normally interactive, good eye contact, not anxious appearing, and not depressed appearing.     Impression & Recommendations:  Problem # 1:  ABDOMINAL PAIN, UNSPECIFIED SITE (ICD-789.00) Per history, ? GERD vs gastritis vs PUD. Hepatobiliary pathology such as biliary cholic possbile and pt has not had [place] (LFTs last visit wnl) Would consider obtaining US to r/o gallstones, but LFTs wnl making this etiology less likely. HIDA scan could be considered as well for dysfunctional GB, but think this is less likely  Plan: empiric PPI Refer to GI for screening colonoscopy (age 39 and brother with colon CA age 36) and possible EGD  Orders: Gastroenterology Referral (GI)  Problem # 2:  HYPERLIPIDEMIA (ICD-272.4) at goal no change LFTs wnl  His updated medication list for this problem includes:    Zocor 40 Mg Tabs (Simvastatin) ..... Marland Kitchenqdtab  Labs Reviewed: SGOT: 23 (12/18/2008)   SGPT: 38 (12/18/2008)   HDL:37 (12/18/2008), 32 (05/27/2008)  LDL:84 (12/18/2008), 75  (16/10/9602)  Chol:155 (12/18/2008), 130 (05/27/2008)  Trig:168 (12/18/2008), 117 (05/27/2008)  Problem # 3:  HYPERTENSION (ICD-401.9) At goal states he has lower BPs at home ACE-I prescribed by Dr. Lynnea Ferrier, but pt had his BP checked in pharmacy and advised to speak to prescribing physician before starting given it was low-normal....will defer to Dr. Lynnea Ferrier  His updated medication list for this problem includes:    Coreg 12.5 Mg Tabs (Carvedilol) .Marland Kitchen... Take 1  tablet by mouth two times a day    Furosemide 20 Mg Tabs (Furosemide) .Marland Kitchen... Take 1/2  tablet by mouth once a day  BP today: 131/70 Prior BP: 147/95 (12/18/2008)  Labs Reviewed: K+: 3.9 (12/18/2008) Creat: : 1.09 (12/18/2008)   Chol: 155 (12/18/2008)   HDL: 37 (12/18/2008)   LDL: 84 (12/18/2008)   TG: 168 (12/18/2008)  Problem # 4:  SPINAL STENOSIS, LUMBAR (ICD-724.02) Please refer to HPI and PMH  Complete Medication List: 1)  Albuterol 90 Mcg/act Aers (Albuterol) .... 2 puffs every 6 hours as needed for shortness of breath 2)  Zocor 40 Mg Tabs (Simvastatin) .... .qdtab 3)  Aspirin Adult Low Strength 81 Mg Tbec (Aspirin) .... Take 1 tablet by mouth once a day 4)  Coreg 12.5 Mg Tabs (Carvedilol) .... Take 1 tablet by mouth two times a day 5)  Furosemide 20 Mg Tabs (Furosemide) .... Take 1/2  tablet by mouth once a day 6)  Tramadol Hcl 50 Mg Tabs (Tramadol hcl) .... Take 1 tablet by mouth three times a day as needed pain 7)  Protonix 40 Mg Tbec (Pantoprazole sodium) .... Take 1 tablet by mouth once a day  Patient Instructions: 1)  Please schedule a follow-up appointment in 3 months. 2)  Will start protonix for abdominal pain. 3)  Will put in referral for gastroenterologist, make sure to mention the abdominal pain to them as well. 4)  Continue with your medications listed below. 5)  follow up with Dr. Lynnea Ferrier as scheduled previously 6)  If you have any problems before your next visit, call clinic.  Prescriptions: PROTONIX 40  MG TBEC (PANTOPRAZOLE SODIUM) Take 1 tablet by mouth once a day  #30 x 3   Entered and Authorized by:   Mariea Stable MD   Signed by:   Mariea Stable MD on 03/13/2009   Method used:   Print then Give to Patient   RxID:   [id number]  Attempts to schedule pt with Dr. Matthias Hughs.  Dr. Donavan Burnet next available new appointment is in May.  Pt said that he will await the appointment  in May.  Pt to be placed on a cancellation list to be called if appointment becomes available before May 2011.  Prevention & Chronic Care Immunizations   Influenza vaccine: Not documented   Influenza vaccine deferral: Refused  (10/14/2008)    Tetanus booster: 12/19/2008: Td    Pneumococcal vaccine: Not documented  Colorectal Screening   Hemoccult: Not documented    Colonoscopy: Not documented  Other Screening   PSA: Not documented   Smoking status: quit  (03/13/2009)  Lipids   Total Cholesterol: 155  (12/18/2008)   LDL: 84  (12/18/2008)   LDL Direct: Not documented   HDL: 37  (12/18/2008)   Triglycerides: 168  (12/18/2008)    SGOT (AST): 23  (12/18/2008)   SGPT (ALT): 38  (12/18/2008)   Alkaline phosphatase: 115  (12/18/2008)   Total bilirubin: 0.6  (12/18/2008)  Hypertension   Last Blood Pressure: 131 / 70  (03/13/2009)   Serum creatinine: 1.09  (12/18/2008)   Serum potassium 3.9  (12/18/2008)  Self-Management Support :    Patient will work on the following items until the next clinic visit to reach self-care goals:     Medications and monitoring: take my medicines every day, bring all of my medications to every visit  (03/13/2009)     Eating: drink diet soda or water instead of juice or soda, eat more vegetables, eat foods that are  low in salt  (03/13/2009)     Activity: take a 30 minute walk every day  (03/13/2009)    Hypertension self-management support: Written self-care plan, Education handout  (03/13/2009)   Hypertension self-care plan printed.   Hypertension education  handout printed    Lipid self-management support: Written self-care plan, Education handout  (03/13/2009)   Lipid self-care plan printed.   Lipid education handout printed     Vital Signs:  Patient profile:   50 year old male Height:      74.9 inches (190.25 cm) Weight:      356.02 pounds (161.83 kg) BMI:     44.78 Temp:     98 degrees F (36.67 degrees C) oral Pulse rate:   70 / minute BP sitting:   131 / 70  (right arm) Cuff size:   large  Vitals Entered By: Angelina Ok RN (March 13, 2009 3:45 PM)

## 2010-02-10 NOTE — Letter (Signed)
[facility]: [facility]   Imported By: Margie Billet 05/29/2009 12:00:37  _____________________________________________________________________  External Attachment:    Type:   Image     Comment:   External Document

## 2010-02-10 NOTE — Consult Note (Signed)
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 Venetia Maxon from [facility] spine specialists:           -Recommended that he undergo lumbar spinal epidural injections and pain management treatment as he did            not   think the patient was a surgical candidate due to asthma and general comorbidities.  He is to f/u on            as needed   basis and recommended seeing Dr. Wynn Banker for pain managment and injection therapy.  ??Polycythemia vera Dizziness (chronic) Prostatitis 2005, ? UTI in his 42s ??gout (no synovial fluid analysis) (02/19/2009 10:48)       Problem # 13:  SPINAL STENOSIS, LUMBAR (ICD-724.02) Seen by Dr. Venetia Maxon from [facility] brain and spine specialists: Recommended that he undergo lumbar spinal epidural injections and pain management treatment as he did not think the patient was a surgical candidate due to asthma and general comorbidities.  He is to f/u on as needed basis and recommended seeing Dr. Wynn Banker for pain managment and injection therapy.   Complete Medication List: 1)  Albuterol 90 Mcg/act Aers (Albuterol) .... 2 puffs every 6 hours as needed for shortness of breath 2)  Zocor 40 Mg Tabs (Simvastatin) .... .qdtab 3)  Aspirin Adult Low Strength 81 Mg Tbec (Aspirin) .... Take 1 tablet by mouth once a day 4)  Nitrostat 0.4 Mg Subl (Nitroglycerin) .... Take one tab for chest pain, take up to  three over 15 minutes before caling 911 5)  Coreg 12.5 Mg Tabs (Carvedilol) .... Take 1 tablet by mouth two times a day 6)  Furosemide 20 Mg Tabs (Furosemide) .... Take 1/2  tablet by mouth once a day 7)  Tramadol Hcl 50 Mg Tabs (Tramadol hcl) .... 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 Venetia Maxon from [facility] brain and spine specialists:           -Recommended that he undergo lumbar spinal epidural injections and pain management treatment as he did            not   think the patient was a surgical candidate due to asthma and general comorbidities.  He is to f/u on            as needed   basis and recommended seeing Dr. Wynn Banker for pain managment and injection therapy.  ??Polycythemia vera Dizziness (chronic) Prostatitis 2005, ? UTI in  his 51s ??gout (no synovial fluid analysis)

## 2010-02-10 NOTE — Assessment & Plan Note (Signed)
Summary: CHECKUP/SB.   Vital Signs:  Patient profile:   50 year old male Height:      74.9 inches (190.25 cm) Weight:      354.05 pounds (160.93 kg) BMI:     44.53 Temp:     97.8 degrees F (36.56 degrees C) oral Pulse rate:   74 / minute BP sitting:   134 / 86  (right arm) Cuff size:   large  Vitals Entered By: Angelina Ok RN (October 29, 2009 10:41 AM) CC: Depression Is Patient Diabetic? No Pain Assessment Patient in pain? yes     Location: back Intensity: 6 Type: aching Onset of pain  Constant Nutritional Status BMI of > 30 = obese  Have you ever been in a relationship where you felt threatened, hurt or afraid?No   Does patient need assistance? Functional Status Self care Ambulation Normal Comments Headaches and dizziness for sometime.  Postnasal drip. Chest Congestion at waking.  Hasstarted Lisinopril 10 mg two times a day per Dr. Lynnea Ferrier.  Cardiology.  Check up   Primary Care Provider:  Mariea Stable MD  CC:  Depression.  History of Present Illness: Mr Denomme is a 50 yo man with PMH as outlined in chart.  He is here for routine follow up.  He reports some dizziness.  Described as unsteadiness.  Feels like he is "drunk".  Reports there is a sensation of spinning/moving.  Follows with Dr. Wynn Banker for chronic neck/back pain.  Depression History:      The patient denies a depressed mood most of the day and a diminished interest in his usual daily activities.         Preventive Screening-Counseling & Management  Alcohol-Tobacco     Smoking Status: quit     Year Quit: 61yrs ago     Pack years: 1ppd x 12 yrs.  Current Medications (verified): 1)  Albuterol 90 Mcg/act  Aers (Albuterol) .... 2 Puffs Every 6 Hours As Needed For Shortness of Breath 2)  Zocor 40 Mg Tabs (Simvastatin) .... .qdtab 3)  Aspirin Adult Low Strength 81 Mg Tbec (Aspirin) .... Take 1 Tablet By Mouth Once A Day 4)  Furosemide 20 Mg Tabs (Furosemide) .... Take 1/2  Tablet By Mouth Once A  Day 5)  Tramadol Hcl 50 Mg Tabs (Tramadol Hcl) .... Take 1 Tablet By Mouth Three Times A Day As Needed Pain 6)  Coreg 12.5 Mg Tabs (Carvedilol) .... Take 1 Tablet By Mouth Two Times A Day 7)  Pepcid Ac Maximum Strength 20 Mg Tabs (Famotidine) .... One At Bedtime 8)  Lisinopril 10 Mg Tabs (Lisinopril) .... Take 1 Tablet By Mouth Two Times A Day  Allergies (verified): 1)  ! 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 Venetia Maxon from [facility] brain and spine specialists:           -Recommended that he undergo lumbar spinal epidural injections and pain management treatment as he did            not   think the patient was a surgical candidate due to asthma and general comorbidities.  He is to f/u on                as needed   basis and recommended seeing Dr. Wynn Banker for pain managment and injection therapy            (decision to hold off 2/2 psych reactions with steroids).  ??Polycythemia vera Dizziness (chronic) Prostatitis 2005, ? UTI in his 53s ??gout (no synovial fluid analysis) Cholecystitis >> cleared for sugery by Pulmonary July 28, 2009   Family History: Last updated: 03/13/2009 no asthma in fm seasonal allergies mother and siblings father- MI at 35 yo Brother had double bypass recently, in his 9's Brother with colon cancer age 20 Mother has angina.   Social History: Last updated: 02/27/2008 On disability benefit.  10/08, hasn't worked since 2004 quit smoking 1988, never drinks alcohol, used to drink heavily  Risk Factors: Smoking Status: quit (10/29/2009)  Past Surgical History: lap chole (07/2009) colonoscopy 2011  Review of Systems      See  HPI  Physical Exam  General:  obese, alert and cooperative to examination.   Eyes:  anicteric Lungs:  normal respiratory effort and no accessory muscle use.   Neurologic:  alert & oriented X3 and gait normal.   Psych:  Oriented X3, memory intact for recent and remote, and normally interactive.     Impression & Recommendations:  Problem # 1:  VERTIGO (ICD-780.4)  Will treat with meclizine.  If not resolved within 2 weeks, refer for vestibular rehab.  His updated medication list for this problem includes:    Meclizine Hcl 25 Mg Tabs (Meclizine hcl) .Marland Kitchen... Take 1 tablet by mouth two times a day  Problem # 2:  HYPERLIPIDEMIA (ICD-272.4) at goal  His updated medication list for this problem includes:    Zocor 40 Mg Tabs (Simvastatin) ..... Marland Kitchenqdtab  Labs Reviewed: SGOT: 23 (05/13/2009)   SGPT: 34 (05/13/2009)   HDL:35 (05/13/2009), 37 (12/18/2008)  LDL:68 (05/13/2009), 84 (12/18/2008)  Chol:127 (05/13/2009), 155 (12/18/2008)  Trig:119 (05/13/2009), 168 (12/18/2008)  Problem # 3:  HYPERTENSION (ICD-401.9)  at goal  His updated medication list for this problem includes:    Furosemide 20 Mg Tabs (Furosemide) .Marland Kitchen... Take 1/2  tablet by mouth once a day    Coreg 12.5 Mg Tabs (Carvedilol) .Marland Kitchen... Take 1 tablet by mouth two times a day    Lisinopril 10 Mg Tabs (Lisinopril) .Marland Kitchen... Take 1 tablet by mouth two times a day  BP today: 134/86 Prior BP: 100/60 (07/28/2009)  Labs Reviewed: K+: 3.7 (05/13/2009) Creat: : 1.10 (05/13/2009)   Chol: 127 (05/13/2009)   HDL: 35 (05/13/2009)   LDL: 68 (05/13/2009)   TG: 119 (05/13/2009)  Complete Medication List: 1)  Albuterol 90 Mcg/act Aers (Albuterol) .... 2 puffs every 6 hours as needed for shortness of breath 2)  Zocor 40 Mg Tabs (Simvastatin) .... .qdtab 3)  Aspirin Adult Low Strength 81 Mg Tbec (Aspirin) .... Take 1 tablet by mouth once a day 4)  Furosemide 20 Mg Tabs (Furosemide) .... Take 1/2  tablet by mouth once a day 5)  Tramadol Hcl 50 Mg  Tabs (Tramadol hcl) .... Take 1 tablet by mouth three times a day as needed pain 6)  Coreg 12.5 Mg Tabs (Carvedilol) .... Take 1 tablet by mouth two times a day 7)  Pepcid Ac Maximum Strength 20 Mg Tabs (Famotidine) .... One at bedtime 8)  Lisinopril 10 Mg Tabs (Lisinopril) .... Take 1 tablet by mouth two times a day 9)  Meclizine  Hcl 25 Mg Tabs (Meclizine hcl) .... Take 1 tablet by mouth two times a day  Patient Instructions: 1)  Please schedule a follow-up appointment in 6 months. 2)  Will try meclizine for 2 weeks or so.  If symptoms persist, will refer for vestibular rehab. 3)  Continue medications listed below. 4)  If you have any other problems, call clinic.  Prescriptions: MECLIZINE HCL 25 MG TABS (MECLIZINE HCL) Take 1 tablet by mouth two times a day  #30 x 0   Entered and Authorized by:   Mariea Stable MD   Signed by:   Mariea Stable MD on 10/29/2009   Method used:   Electronically to        [facility] # [phone]* (retail)       [address]       [place], [place]  [address]       Ph: [phone]       Fax: [phone]   RxID:   [id number] LISINOPRIL 10 MG TABS (LISINOPRIL) Take 1 tablet by mouth two times a day  #60 x 0   Entered and Authorized by:   Mariea Stable MD   Signed by:   Mariea Stable MD on 10/29/2009   Method used:   Electronically to        [facility] # [phone]* (retail)       [address]       [place], [place]  [address]       Ph: [phone]       Fax: [phone]   RxID:   [id number] COREG 12.5 MG TABS (CARVEDILOL) Take 1 tablet by mouth two times a day  #60 x 0   Entered and Authorized by:   Mariea Stable MD   Signed by:   Mariea Stable MD on 10/29/2009   Method used:   Electronically to        [facility] # [phone]* (retail)       [address]       [place], [place]  [address]       Ph: [phone]       Fax: [phone]   RxID:    [id number]    Orders Added: 1)  Est. Patient Level III [[address]]     Vital Signs:  Patient profile:   50 year old male Height:      74.9 inches (190.25 cm) Weight:      354.05 pounds (160.93 kg) BMI:     44.53 Temp:     97.8 degrees F (36.56 degrees C) oral Pulse rate:   74 / minute BP sitting:   134 / 86  (right arm) Cuff size:   large  Vitals Entered By: Angelina Ok RN (October 29, 2009 10:41 AM)   Prevention & Chronic Care Immunizations   Influenza vaccine: Not documented   Influenza vaccine deferral: Refused  (10/14/2008)    Tetanus booster: 12/19/2008: Td    Pneumococcal vaccine: Not documented  Colorectal Screening   Hemoccult: Not documented    Colonoscopy: Not documented  Other Screening   PSA: Not documented  Reports requested:   Last colonoscopy report requested.  Smoking status: quit  (10/29/2009)  Lipids   Total Cholesterol: 127  (05/13/2009)   LDL: 68  (05/13/2009)   LDL  Direct: Not documented   HDL: 35  (05/13/2009)   Triglycerides: 119  (05/13/2009)    SGOT (AST): 23  (05/13/2009)   SGPT (ALT): 34  (05/13/2009)   Alkaline phosphatase: 113  (05/13/2009)   Total bilirubin: 0.7  (05/13/2009)    Lipid flowsheet reviewed?: Yes   Progress toward LDL goal: At goal  Hypertension   Last Blood Pressure: 134 / 86  (10/29/2009)   Serum creatinine: 1.10  (05/13/2009)   Serum potassium 3.7  (05/13/2009)    Hypertension flowsheet reviewed?: Yes   Progress toward BP goal: At goal  Self-Management Support :    Patient will work on the following items until the next clinic visit to reach self-care goals:     Medications and monitoring: take my medicines every day, bring all of my medications to every visit  (10/29/2009)     Eating: drink diet soda or water instead of juice or soda, eat more vegetables, use fresh or frozen vegetables, eat foods that are low in salt, eat baked foods instead of fried foods, eat fruit for snacks and desserts,  limit or avoid alcohol  (10/29/2009)     Activity: take a 30 minute walk every day  (10/29/2009)    Hypertension self-management support: Written self-care plan, Education handout, Pre-printed educational material, Resources for patients handout  (10/29/2009)   Hypertension self-care plan printed.   Hypertension education handout printed    Lipid self-management support: Written self-care plan, Education handout, Pre-printed educational material, Resources for patients handout  (10/29/2009)   Lipid self-care plan printed.   Lipid education handout printed      Resource handout printed.   Nursing Instructions: Request report of last colonoscopy (Dr. Matthias Hughs)

## 2010-02-10 NOTE — Progress Notes (Signed)
Summary: Refill/gh  Phone Note Refill Request Message from:  Fax from Pharmacy on September 29, 2009 10:25 AM  Refills Requested: Medication #1:  ZOCOR 40 MG TABS .qdtab Last labs and visit 03/2009.   Method Requested: Electronic Initial call taken by: Angelina Ok RN,  September 29, 2009 10:25 AM    Prescriptions: ZOCOR 40 MG TABS (SIMVASTATIN) .qdtab  #30 x 5   Entered and Authorized by:   Zoila Shutter MD   Signed by:   Zoila Shutter MD on 09/29/2009   Method used:   Electronically to        [facility] # [phone]* (retail)       [address]       [place], [place]  [address]       Ph: [phone]       Fax: [phone]   RxID:   [id number]

## 2010-02-10 NOTE — Assessment & Plan Note (Signed)
Summary: Pulmonary pre op eval for GB   Primary Provider/Referring Provider:  Mariea Stable MD  CC:  Followup.  Pt needs surgery clearance for cholecystectomy- states that overall his breathing has been okay but did have some trouble after having his colonoscopy about 1 month ago.  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  Since that time he said he said symptoms everyday when eval here 03/15/07  Started on symbicort 80 last ov and comes back all smiles no limitations, climbing stairs ok sleep all night with no resp disturbance.  July 28, 2009 ov  flared since first of June 2011 on non-specific beta blockers  but now  no longer needing albuterol but needing gallbladder surgery 7/21 and requesting clearance. no sob, cough. Pt denies any significant sore throat, dysphagia, itching, sneezing,  nasal congestion or excess secretions,  fever, chills, sweats, unintended wt loss, pleuritic or exertional cp, hempoptysis, change in activity tolerance  orthopnea pnd or leg swelling      Current Medications (verified): 1)  Albuterol 90 Mcg/act  Aers (Albuterol) .... 2 Puffs Every 6 Hours As Needed For Shortness of Breath 2)  Zocor 40 Mg Tabs (Simvastatin) .... .qdtab 3)  Aspirin Adult Low Strength 81 Mg Tbec (Aspirin) .... Take 1 Tablet By Mouth Once A Day 4)  Coreg 12.5 Mg Tabs (Carvedilol) .... Take 1 Tablet By Mouth Two Times A Day 5)  Furosemide 20 Mg Tabs (Furosemide) .... Take 1/2  Tablet By Mouth Once A Day 6)  Tramadol Hcl 50 Mg Tabs (Tramadol Hcl) .... Take 1 Tablet By Mouth Three Times A Day As Needed Pain  Allergies (verified): 1)  ! 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 Venetia Maxon from [facility] brain and spine specialists:           -Recommended that he undergo lumbar spinal epidural injections and pain management treatment as he did            not   think the patient was a surgical candidate due to asthma and general comorbidities.  He is to f/u on                as needed   basis and recommended seeing Dr. Wynn Banker for pain managment and injection therapy            (decision to hold off 2/2 psych reactions with steroids).  ??Polycythemia vera Dizziness (chronic) Prostatitis 2005, ? UTI in his 81s ??gout (no synovial fluid analysis) Cholecystitis >> cleared for sugery by Pulmonary July 28, 2009   Vital Signs:  Patient profile:   50 year old male Weight:      353.50 pounds O2 Sat:      94 % on Room air Temp:     98.2 degrees F oral Pulse rate:   82 / minute BP sitting:   100 / 60  (right arm) Cuff size:   large  Vitals Entered By: Vernie Murders,  CMA  O2 Flow:  Room air  Physical Exam  Additional Exam:  in general, obese white male in no acute distress wt  356 > 353 July 29, 2009  HEENT: nl dentition, moderate nonspecific swelling of the turbinates, and orophanx. Nl external ear canals without cough reflex Neck without JVD/Nodes/TM Lungs clear to A and P bilaterally without cough on insp or exp maneuvers RRR no s3 or murmur or increase in P2 Abd soft and massively obese but otherwise  benign with nl excursion in the supine position. No bruits or organomegaly Ext warm without calf tenderness, cyanosis clubbing or edema Skin warm and dry without lesions     Impression & Recommendations:  Problem # 1:  ASTHMA (ICD-493.90) All goals of asthma met including optimal function and elimination of symptoms with minimum need for rescue therapy. Contingencies discussed today including the rule of two's.    Problem is freq exac and can't tolerate steroids even in topical form so needs to avoid using non-specific Beta blockers.  Strongly favor here Bystolic, the most beta -1  selective Beta blocker available in sample form, with bisoprolol the most selective generic choice  on the market.   I spent extra time with the patient today explaining optimal mdi  technique.  This improved from  50-75%   Each maintenance medication was reviewed in detail including most importantly the difference between maintenance and as needed and under what circumstances the prns are to be used. See instructions for specific recommendations   Problem # 2:  OBESITY (ICD-278.00)  this is the greatest risk factor for post op lap chole complications but is not prohibitive for needed surgery.  Rec IS, early mobilization, minimal narcotics, bipap as needed immediately post op  Orders: Est. Patient Level IV ([address])  Problem # 3:  GERD (ICD-530.81)  The following medications were removed from the medication list:    Protonix 40 Mg Tbec (Pantoprazole sodium) .Eduardo Duncan... Take 1 tablet by mouth once a day His updated medication list for this problem includes:    Pepcid Ac Maximum Strength 20 Mg Tabs (Famotidine) ..... One at bedtime  Definite risk for exacerbation - diet reviewed/  add pepcid at hs for now.   Orders: Est. Patient Level IV ([address])  Problem # 4:  OBESITY (ICD-278.00)  Weight control is a matter of calorie balance which needs to be tilted in the pt's favor by eating less and exercising more.  Specifically, I recommended  exercise at a level where pt  is short of breath but not out of breath 30 minutes daily.  If not losing weight on this program, I would strongly recommend pt see a nutritionist with a food diary recorded for two weeks prior to the visit.     Orders: Est. Patient Level IV ([address])  Medications Added to Medication List This Visit: 1)  Bystolic 5 Mg Tabs (Nebivolol hcl) .... One tablet  daily 2)  Pepcid Ac Maximum Strength 20 Mg Tabs (Famotidine) .... One at bedtime  Other Orders: HFA Instruction [phone])  Patient Instructions: 1)  If your breathing worsens or you need to use your rescue inhaler more than twice weekly or wake up more than twice a month with any respiratory symptoms or require more than two rescue inhalers per year, we need to see you right away. 2)  Pepcid 20 mg on at bedtime 3)  GERD (REFLUX)  is a common cause of respiratory symptoms. It commonly presents without heartburn and can be treated with medication,  but also with lifestyle changes including avoidance of late meals, excessive alcohol, smoking cessation, and avoid fatty foods, chocolate, peppermint, colas, red wine, and acidic juices such as orange juice. NO MINT OR MENTHOL PRODUCTS SO NO COUGH DROPS  4)  USE SUGARLESS CANDY INSTEAD (jolley ranchers)  5)  NO OIL BASED VITAMINS

## 2010-02-10 NOTE — Progress Notes (Signed)
Summary: refill/ hla  Phone Note Refill Request Message from:  Fax from Pharmacy on March 20, 2009 10:26 AM  Refills Requested: Medication #1:  ZOCOR 40 MG TABS .qdtab   Last Refilled: 02/16/2009 last visit 3/3, labs 12/10  Initial call taken by: Marin Roberts RN,  March 20, 2009 10:26 AM  Follow-up for Phone Call        Rx faxed to pharmacy Follow-up by: Mariea Stable MD,  March 20, 2009 10:52 AM    Prescriptions: ZOCOR 40 MG TABS (SIMVASTATIN) .qdtab  #30 x 5   Entered and Authorized by:   Mariea Stable MD   Signed by:   Mariea Stable MD on 03/20/2009   Method used:   Electronically to        [facility] # [phone]* (retail)       [address]       [place], [place]  [address]       Ph: [phone]       Fax: [phone]   RxID:   [id number]

## 2010-02-10 NOTE — Consult Note (Signed)
Summary: LAPAROSCOPIC  LAPAROSCOPIC   Imported By: Margie Billet 09/11/2009 15:14:57  _____________________________________________________________________  External Attachment:    Type:   Image     Comment:   External Document

## 2010-02-10 NOTE — Progress Notes (Signed)
Summary: med refill/gp  Phone Note Refill Request Message from:  Fax from Pharmacy on May 19, 2009 11:35 AM  Refills Requested: Medication #1:  FUROSEMIDE 20 MG TABS Take 1/2  tablet by mouth once a day   Last Refilled: 04/20/2009  Method Requested: Electronic Initial call taken by: Chinita Pester RN,  May 19, 2009 11:35 AM  Follow-up for Phone Call        Rx faxed to pharmacy Follow-up by: Mariea Stable MD,  May 21, 2009 8:32 AM    Prescriptions: FUROSEMIDE 20 MG TABS (FUROSEMIDE) Take 1/2  tablet by mouth once a day  #30 x 3   Entered and Authorized by:   Mariea Stable MD   Signed by:   Mariea Stable MD on 05/21/2009   Method used:   Electronically to        [facility] # [phone]* (retail)       [address]       [place], [place]  [address]       Ph: [phone]       Fax: [phone]   RxID:   [id number]

## 2010-02-12 NOTE — Progress Notes (Signed)
Summary: Refill/gh  Phone Note Refill Request Message from:  Fax from Pharmacy on January 06, 2010 12:41 PM  Refills Requested: Medication #1:  LISINOPRIL 10 MG TABS Take 1 tablet by mouth two times a day   Last Refilled: 10/29/2009 Last office visit was 10/29/2009.  Last labs were 05/2009   Method Requested: Electronic Initial call taken by: Angelina Ok RN,  January 06, 2010 12:41 PM  Follow-up for Phone Call        Refill approved-nurse to complete Follow-up by: Julaine Fusi  DO,  January 06, 2010 3:03 PM    Prescriptions: LISINOPRIL 10 MG TABS (LISINOPRIL) Take 1 tablet by mouth two times a day  #60 x 0   Entered and Authorized by:   Julaine Fusi  DO   Signed by:   Julaine Fusi  DO on 01/06/2010   Method used:   Electronically to        [facility] # [phone]* (retail)       [address]       [place], [place]  [address]       Ph: [phone]       Fax: [phone]   RxID:   [id number]

## 2010-02-12 NOTE — Progress Notes (Addendum)
Summary: REFILL/ HLA  Phone Note Refill Request Message from:  Patient on February 06, 2010 11:44 AM  Refills Requested: Medication #1:  LISINOPRIL 10 MG TABS Take 1 tablet by mouth two times a day   Dosage confirmed as above?Dosage Confirmed Initial call taken by: Marin Roberts RN,  February 06, 2010 11:44 AM  Follow-up for Phone Call        Rx faxed to pharmacy Follow-up by: Mariea Stable MD,  February 06, 2010 2:55 PM    Prescriptions: LISINOPRIL 10 MG TABS (LISINOPRIL) Take 1 tablet by mouth two times a day  #60 x 3   Entered and Authorized by:   Mariea Stable MD   Signed by:   Mariea Stable MD on 02/06/2010   Method used:   Electronically to        [facility] # [phone]* (retail)       [address]       [place], [place]  [address]       Ph: [phone]       Fax: [phone]   RxID:   [id number]

## 2010-02-17 ENCOUNTER — Ambulatory Visit: Payer: Medicare Other | Attending: Physical Medicine & Rehabilitation

## 2010-02-17 ENCOUNTER — Ambulatory Visit: Payer: Medicare Other | Admitting: Physical Medicine & Rehabilitation

## 2010-02-17 DIAGNOSIS — R42 Dizziness and giddiness: Secondary | ICD-10-CM | POA: Insufficient documentation

## 2010-02-17 DIAGNOSIS — IMO0002 Reserved for concepts with insufficient information to code with codable children: Secondary | ICD-10-CM | POA: Insufficient documentation

## 2010-02-17 DIAGNOSIS — R209 Unspecified disturbances of skin sensation: Secondary | ICD-10-CM | POA: Insufficient documentation

## 2010-02-17 DIAGNOSIS — M79609 Pain in unspecified limb: Secondary | ICD-10-CM | POA: Insufficient documentation

## 2010-02-17 DIAGNOSIS — M4802 Spinal stenosis, cervical region: Secondary | ICD-10-CM | POA: Insufficient documentation

## 2010-02-17 DIAGNOSIS — F411 Generalized anxiety disorder: Secondary | ICD-10-CM | POA: Insufficient documentation

## 2010-02-17 DIAGNOSIS — M542 Cervicalgia: Secondary | ICD-10-CM | POA: Insufficient documentation

## 2010-02-17 DIAGNOSIS — G4730 Sleep apnea, unspecified: Secondary | ICD-10-CM | POA: Insufficient documentation

## 2010-02-17 DIAGNOSIS — R269 Unspecified abnormalities of gait and mobility: Secondary | ICD-10-CM | POA: Insufficient documentation

## 2010-02-17 DIAGNOSIS — M25549 Pain in joints of unspecified hand: Secondary | ICD-10-CM

## 2010-02-17 DIAGNOSIS — M48061 Spinal stenosis, lumbar region without neurogenic claudication: Secondary | ICD-10-CM | POA: Insufficient documentation

## 2010-02-17 DIAGNOSIS — M5380 Other specified dorsopathies, site unspecified: Secondary | ICD-10-CM | POA: Insufficient documentation

## 2010-03-12 ENCOUNTER — Other Ambulatory Visit: Payer: Self-pay | Admitting: *Deleted

## 2010-03-13 ENCOUNTER — Encounter: Payer: Self-pay | Admitting: Internal Medicine

## 2010-03-13 ENCOUNTER — Ambulatory Visit (INDEPENDENT_AMBULATORY_CARE_PROVIDER_SITE_OTHER): Payer: Medicare Other | Admitting: Internal Medicine

## 2010-03-13 DIAGNOSIS — I10 Essential (primary) hypertension: Secondary | ICD-10-CM

## 2010-03-13 DIAGNOSIS — F319 Bipolar disorder, unspecified: Secondary | ICD-10-CM

## 2010-03-13 DIAGNOSIS — R42 Dizziness and giddiness: Secondary | ICD-10-CM

## 2010-03-13 LAB — BASIC METABOLIC PANEL
Chloride: 103 mEq/L (ref 96–112)
Potassium: 4.1 mEq/L (ref 3.5–5.3)

## 2010-03-13 MED ORDER — FUROSEMIDE 20 MG PO TABS: 10.0000 mg | ORAL_TABLET | Freq: Every day | ORAL | Status: DC

## 2010-03-13 NOTE — Patient Instructions (Signed)
Please stand up slowly to avoid any fall.  We will call you if any abnormal lab results. Try to exercise and eat healthy diet and weight loss.

## 2010-03-13 NOTE — Assessment & Plan Note (Signed)
Unclear the cause of dizziness, may be due to orthostatic hypotension, autonomic dysfunction, psycho, or his cervical spinal disease. We have checked orthostatic vitals, negative. Meclizine not helpful. So we will stop lasix first to see if this will help and also advised him to stand up slowly. Will recheck in 2 months.

## 2010-03-13 NOTE — Progress Notes (Signed)
  Subjective:    Patient ID: Eduardo Duncan, male    DOB: [birth year], 50 y.o.   MRN: [id number]  HPI Pt is a 50 yo male with PMH of spinal stenosis, HTN, vertigo and morbid obesity who came here for follow-up for his vertigo. His vertigo recently slightly worse, and he has not taken meclizine for several months because it caused dry eye and mouth. The dizziness is worse on standing, no palpitation or chest pain. Sometimes depression could make it worse. Denies drug or ETOH abuse. No weight change or SOB, CP.      Review of Systems  Constitutional: Negative for fever, chills, diaphoresis, activity change and appetite change.  HENT: Positive for neck pain. Negative for ear pain, congestion, sore throat and sneezing.   Eyes: Negative for pain and redness.  Respiratory: Negative for cough and stridor.   Cardiovascular: Negative for chest pain, palpitations and leg swelling.  Gastrointestinal: Positive for vomiting. Negative for nausea, abdominal pain, abdominal distention and anal bleeding.  Neurological: Positive for dizziness and light-headedness. Negative for tremors, seizures, weakness and numbness.  Psychiatric/Behavioral: Negative for suicidal ideas.       Objective:   Physical Exam  Constitutional: He is oriented to person, place, and time. He appears well-developed and well-nourished.  HENT:  Head: Normocephalic and atraumatic.  Eyes: Conjunctivae and EOM are normal. Pupils are equal, round, and reactive to light.  Neck: Normal range of motion. Neck supple. No thyromegaly present.  Cardiovascular: Normal rate, regular rhythm, normal heart sounds and intact distal pulses.   No murmur heard. Pulmonary/Chest: Effort normal and breath sounds normal. No respiratory distress. He has no wheezes. He exhibits no tenderness.  Abdominal: He exhibits no distension and no mass. There is no tenderness. There is no rebound and no guarding.  Musculoskeletal: Normal range of motion.  Neurological:  He is alert and oriented to person, place, and time. He has normal reflexes. No cranial nerve deficit. Coordination normal.          Assessment & Plan:

## 2010-03-13 NOTE — Assessment & Plan Note (Signed)
BP well controlled and because his dizziness may be due to orthostatic hypotension, will stop lasix and recheck BP at next visit in about 2 months. If leg swelling restart and no improvement of dizziness, may restart lasix.

## 2010-03-13 NOTE — Assessment & Plan Note (Signed)
Hid depression is fairly controlled and denies SI/HI. Not on any psych meds.

## 2010-03-18 ENCOUNTER — Ambulatory Visit: Payer: Medicare Other | Admitting: Internal Medicine

## 2010-03-23 DIAGNOSIS — R209 Unspecified disturbances of skin sensation: Secondary | ICD-10-CM

## 2010-03-24 ENCOUNTER — Ambulatory Visit: Payer: Medicare Other | Admitting: Physical Medicine & Rehabilitation

## 2010-03-24 ENCOUNTER — Encounter: Payer: Medicare Other | Attending: Physical Medicine & Rehabilitation

## 2010-03-24 DIAGNOSIS — R29898 Other symptoms and signs involving the musculoskeletal system: Secondary | ICD-10-CM | POA: Insufficient documentation

## 2010-03-24 DIAGNOSIS — R209 Unspecified disturbances of skin sensation: Secondary | ICD-10-CM | POA: Insufficient documentation

## 2010-03-24 DIAGNOSIS — IMO0001 Reserved for inherently not codable concepts without codable children: Secondary | ICD-10-CM | POA: Insufficient documentation

## 2010-03-24 DIAGNOSIS — M5030 Other cervical disc degeneration, unspecified cervical region: Secondary | ICD-10-CM | POA: Insufficient documentation

## 2010-03-28 LAB — CBC
Hemoglobin: 17.3 g/dL — ABNORMAL HIGH (ref 13.0–17.0)
MCH: 30.4 pg (ref 26.0–34.0)
MCV: 88.5 fL (ref 78.0–100.0)
RBC: 5.69 MIL/uL (ref 4.22–5.81)

## 2010-03-28 LAB — URINALYSIS, ROUTINE W REFLEX MICROSCOPIC
Glucose, UA: NEGATIVE mg/dL
Ketones, ur: NEGATIVE mg/dL
Nitrite: NEGATIVE
Protein, ur: NEGATIVE mg/dL

## 2010-03-28 LAB — COMPREHENSIVE METABOLIC PANEL
AST: 40 U/L — ABNORMAL HIGH (ref 0–37)
Alkaline Phosphatase: 89 U/L (ref 39–117)
BUN: 8 mg/dL (ref 6–23)
CO2: 26 mEq/L (ref 19–32)
Chloride: 104 mEq/L (ref 96–112)
Creatinine, Ser: 1.14 mg/dL (ref 0.4–1.5)
GFR calc Af Amer: >60 mL/min (ref >60)
GFR calc non Af Amer: >60 mL/min (ref >60)
Total Bilirubin: 1.1 mg/dL (ref 0.3–1.2)

## 2010-03-28 LAB — DIFFERENTIAL
Basophils Absolute: 0.1 10*3/uL (ref 0.0–0.1)
Eosinophils Absolute: 0.2 10*3/uL (ref 0.0–0.7)
Eosinophils Relative: 3 % (ref 0–5)

## 2010-03-30 ENCOUNTER — Other Ambulatory Visit: Payer: Self-pay | Admitting: Internal Medicine

## 2010-04-14 ENCOUNTER — Other Ambulatory Visit (INDEPENDENT_AMBULATORY_CARE_PROVIDER_SITE_OTHER): Payer: Medicare Other | Admitting: *Deleted

## 2010-04-14 DIAGNOSIS — K219 Gastro-esophageal reflux disease without esophagitis: Secondary | ICD-10-CM

## 2010-04-15 MED ORDER — CARVEDILOL 12.5 MG PO TABS: 12.5000 mg | ORAL_TABLET | Freq: Two times a day (BID) | ORAL | Status: DC

## 2010-04-18 LAB — URINE MICROSCOPIC-ADD ON

## 2010-04-18 LAB — URINALYSIS, ROUTINE W REFLEX MICROSCOPIC
Glucose, UA: NEGATIVE mg/dL
Ketones, ur: 15 mg/dL — AB
Leukocytes, UA: NEGATIVE
pH: 5.5 (ref 5.0–8.0)

## 2010-04-22 LAB — POCT CARDIAC MARKERS
CKMB, poc: <1 ng/mL — ABNORMAL LOW (ref 1.0–8.0)
Myoglobin, poc: 79.6 ng/mL (ref 12–200)
Myoglobin, poc: 92.6 ng/mL (ref 12–200)

## 2010-04-22 LAB — COMPREHENSIVE METABOLIC PANEL
ALT: 36 U/L (ref 0–53)
AST: 33 U/L (ref 0–37)
Albumin: 4.1 g/dL (ref 3.5–5.2)
Alkaline Phosphatase: 121 U/L — ABNORMAL HIGH (ref 39–117)
Chloride: 102 mEq/L (ref 96–112)
GFR calc Af Amer: >60 mL/min (ref >60)
Potassium: 4.2 mEq/L (ref 3.5–5.1)
Sodium: 138 mEq/L (ref 135–145)
Total Bilirubin: 1 mg/dL (ref 0.3–1.2)

## 2010-04-22 LAB — URINALYSIS, ROUTINE W REFLEX MICROSCOPIC
Bilirubin Urine: NEGATIVE
Hgb urine dipstick: NEGATIVE
Nitrite: NEGATIVE
Specific Gravity, Urine: 1.007 (ref 1.005–1.030)
pH: 7 (ref 5.0–8.0)

## 2010-04-22 LAB — DIFFERENTIAL
Basophils Absolute: 0.2 10*3/uL — ABNORMAL HIGH (ref 0.0–0.1)
Basophils Relative: 2 % — ABNORMAL HIGH (ref 0–1)
Eosinophils Relative: 2 % (ref 0–5)
Monocytes Absolute: 0.5 10*3/uL (ref 0.1–1.0)
Monocytes Relative: 7 % (ref 3–12)

## 2010-04-22 LAB — CBC
HCT: 50.6 % (ref 39.0–52.0)
Platelets: 240 10*3/uL (ref 150–400)
WBC: 8 10*3/uL (ref 4.0–10.5)

## 2010-04-23 LAB — URINALYSIS, ROUTINE W REFLEX MICROSCOPIC
Bilirubin Urine: NEGATIVE
Nitrite: NEGATIVE
Specific Gravity, Urine: 1.001 — ABNORMAL LOW (ref 1.005–1.030)
Urobilinogen, UA: 0.2 mg/dL (ref 0.0–1.0)
pH: 7 (ref 5.0–8.0)

## 2010-04-23 LAB — COMPREHENSIVE METABOLIC PANEL
AST: 25 U/L (ref 0–37)
BUN: 11 mg/dL (ref 6–23)
CO2: 28 mEq/L (ref 19–32)
Calcium: 9.1 mg/dL (ref 8.4–10.5)
Chloride: 106 mEq/L (ref 96–112)
Creatinine, Ser: 1 mg/dL (ref 0.4–1.5)
GFR calc Af Amer: >60 mL/min (ref >60)
GFR calc non Af Amer: >60 mL/min (ref >60)
Total Bilirubin: 1 mg/dL (ref 0.3–1.2)

## 2010-04-23 LAB — URIC ACID: Uric Acid, Serum: 8.2 mg/dL — ABNORMAL HIGH (ref 4.0–7.8)

## 2010-04-23 LAB — CBC
HCT: 51.5 % (ref 39.0–52.0)
MCHC: 33.8 g/dL (ref 30.0–36.0)
MCV: 88.2 fL (ref 78.0–100.0)
RBC: 5.84 MIL/uL — ABNORMAL HIGH (ref 4.22–5.81)
WBC: 7 10*3/uL (ref 4.0–10.5)

## 2010-04-23 LAB — DIFFERENTIAL
Basophils Absolute: 0.1 10*3/uL (ref 0.0–0.1)
Eosinophils Relative: 3 % (ref 0–5)
Lymphocytes Relative: 22 % (ref 12–46)
Lymphs Abs: 1.6 10*3/uL (ref 0.7–4.0)
Neutro Abs: 4.6 10*3/uL (ref 1.7–7.7)
Neutrophils Relative %: 65 % (ref 43–77)

## 2010-04-28 LAB — CBC
HCT: 50.2 % (ref 39.0–52.0)
HCT: 50.2 % (ref 39.0–52.0)
MCHC: 35.1 g/dL (ref 30.0–36.0)
MCHC: 35.2 g/dL (ref 30.0–36.0)
MCHC: 35.3 g/dL (ref 30.0–36.0)
MCV: 87.9 fL (ref 78.0–100.0)
MCV: 88 fL (ref 78.0–100.0)
MCV: 88 fL (ref 78.0–100.0)
Platelets: 190 10*3/uL (ref 150–400)
Platelets: 220 10*3/uL (ref 150–400)
Platelets: 222 10*3/uL (ref 150–400)
RDW: 13.7 % (ref 11.5–15.5)
RDW: 13.8 % (ref 11.5–15.5)
WBC: 8.5 10*3/uL (ref 4.0–10.5)

## 2010-04-28 LAB — COMPREHENSIVE METABOLIC PANEL
Albumin: 3.7 g/dL (ref 3.5–5.2)
BUN: 10 mg/dL (ref 6–23)
CO2: 26 mEq/L (ref 19–32)
Calcium: 9 mg/dL (ref 8.4–10.5)
Chloride: 105 mEq/L (ref 96–112)
Creatinine, Ser: 1.13 mg/dL (ref 0.4–1.5)
GFR calc Af Amer: >60 mL/min (ref >60)
GFR calc non Af Amer: >60 mL/min (ref >60)
Total Bilirubin: 1.1 mg/dL (ref 0.3–1.2)

## 2010-04-28 LAB — BASIC METABOLIC PANEL
BUN: 9 mg/dL (ref 6–23)
Calcium: 8.9 mg/dL (ref 8.4–10.5)
GFR calc non Af Amer: >60 mL/min (ref >60)
Potassium: 3.7 mEq/L (ref 3.5–5.1)
Sodium: 139 mEq/L (ref 135–145)

## 2010-04-28 LAB — LIPID PANEL
Cholesterol: 203 mg/dL — ABNORMAL HIGH (ref 0–200)
HDL: 27 mg/dL — ABNORMAL LOW (ref >39)
Total CHOL/HDL Ratio: 7.5 RATIO
VLDL: 25 mg/dL (ref 0–40)

## 2010-04-28 LAB — CARDIAC PANEL(CRET KIN+CKTOT+MB+TROPI)
CK, MB: 1.9 ng/mL (ref 0.3–4.0)
Relative Index: 1.3 (ref 0.0–2.5)
Relative Index: INVALID (ref 0.0–2.5)
Total CK: 178 U/L (ref 7–232)
Troponin I: 0.01 ng/mL (ref 0.00–0.06)
Troponin I: <0.01 ng/mL (ref 0.00–0.06)
Troponin I: <0.01 ng/mL (ref 0.00–0.06)

## 2010-04-28 LAB — DIFFERENTIAL
Basophils Absolute: 0 10*3/uL (ref 0.0–0.1)
Basophils Relative: 1 % (ref 0–1)
Eosinophils Absolute: 0.1 10*3/uL (ref 0.0–0.7)
Monocytes Absolute: 0.5 10*3/uL (ref 0.1–1.0)
Neutro Abs: 6.2 10*3/uL (ref 1.7–7.7)
Neutrophils Relative %: 73 % (ref 43–77)

## 2010-04-29 ENCOUNTER — Encounter: Payer: Medicare Other | Admitting: Internal Medicine

## 2010-05-26 NOTE — Discharge Summary (Signed)
NAME:  Eduardo Duncan, Eduardo Duncan NO.:  [id number]   MEDICAL RECORD NO.:  [id number]          PATIENT TYPE:  OBV   LOCATION:  4736                         FACILITY:  [facility]   PHYSICIAN:  Acey Lav, MD  DATE OF BIRTH:  04-12-1960   DATE OF ADMISSION:  02/27/2008  DATE OF DISCHARGE:  02/29/2008                               DISCHARGE SUMMARY   CONSULTANTS:  Ritta Slot, MD and Antonieta Iba, MD of  [facility].   DISCHARGE DIAGNOSES:  1. Chronic atypical chest pain evaluated with a Myoview that showed a      moderate region of mild reversibility within the apical segment of      the inferolateral wall.  Unfortunately, this exam was confounded by      possible diaphragmatic attenuation or motion artifact.  2. Significant degenerative disk disease and lower spine pathology      resulting in chronic pain.  3. Polycythemia of undetermined etiology.  4. Asthma.  5. Hypertension.  6. Hyperlipidemia.  7. Obesity.   DISCHARGE MEDICATIONS:  1. Zocor 40 mg p.o. daily.  2. Aspirin 81 mg p.o. daily.  3. Coreg 12.5 mg p.o. b.i.d.  4. Nitroglycerin tabs as directed and as needed.  5. Serevent inhaler 1 puff b.i.d.  6. Asthma inhaler as directed, albuterol inhaler.   The patient was provided prescriptions for the Zocor, aspirin, Coreg,  and nitroglycerin tabs, as well as the Serevent inhaler.  Prior to  hospitalization, the patient's only medication was albuterol inhaler  p.r.n.  He had not been using the albuterol inhaler very often.   CONDITION AT DISCHARGE:  The patient was stable and did not complain of  any chest pain at the time of discharge.  The patient will follow up  with Dr. Lynnea Ferrier in 1 week to discuss further potential workup for the  equivocal Myoview.  The patient is also to follow up with me, Dr.  Broadus John, on March 14, 2008, at 2:00 p.m. in the Outpatient Clinic.  At  that time, we will further evaluate his chest pain and compliance with  his new medications, as well as his asthma status.  We will also  consider sending him up for a Hematology/Oncology visit for the  polycythemia.   PROCEDURES:  1. Myoview with the following impression:  A moderate region of a mild      reversibility within the apical segment of the inferolateral wall.      Differential includes mild reversible ischemia versus potential and      diaphragmatic attenuation or motion artifact.  Recommend clinical      correlation with pretest probability for ischemia.  No focal wall      motion abnormality.  No contractility.  Left ventricular ejection      fraction equals 54%.  2. Two-view chest x-ray with no acute cardiopulmonary disease seen.   CONSULTATIONS:  Antonieta Iba, MD and Ritta Slot, MD of  [facility].   ADMISSION HISTORY AND PHYSICAL:  The patient is a 50 year old male with  asthma, chronic chest pain, and a reported negative Myoview in 2003  per  the patient and obstructive sleep apnea who presented to the outpatient  clinic on February 27, 2008, for chest pain, shortness of breath, and  lower extremity pain.  He has following risk factors for coronary artery  disease:  Hypertension, smoking history, obesity, and positive family  history with his father passing away of an MI at age 32.   Regarding the patient's chest pain, the pain is substernal, squeezing  pressure, and is precipitated by immobility.  The pain improves with  exertion.  The pain is not related to food.  The pain frequently occurs  and has been chronic for the past few years.  The patient last saw a  cardiologist in 2003 when he went for a stress test that was reportedly  normal.  The pain has not been increasing in frequency or intensity over  the last few weeks.  The patient does report that his shortness of  breath has increased over the past week, which he attributes to his  asthma.  His shortness of breath occurs both at rest and with exercise  and  does not necessarily occur at the same time as chest pain.  The  patient has an albuterol inhaler but does not wish to take it secondary  to making him hyperactive.  The patient reports that he has chest pain  with palpation, but it is a different quality than his usual chest pain.   Regarding his back pain, the patient has had chronic back pain since  2003 when he suffered a fall on a loading dock.  The patient has had  progressive back pain with bilateral numbness and tingling in his lower  extremities, worse on his inner right thighs bilaterally.  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  The patient reports that he also has spinal stenosis.  The patient  denies saddle anesthesia or incontinence.  The patient does not have  much pain in the lower extremities.  He has primarily numbness and  tingling.  The patient does not have calf pain or increased pain with  fatigue.  The patient had previously been taking benzodiazepines and  opiates for his back pain, but he got off them in August and has just  been using a TENS unit for pain.   PHYSICAL EXAMINATION:  GENERAL:  In no apparent distress.  CARDIOVASCULAR:  Distant heart sounds.  Regular rate and rhythm.  Normal  S1 and S2.  PULMONARY:  Clear to auscultation bilaterally.  Normal breath sounds.  ABDOMEN:  Obese, nontender.  CHEST WALL:  Tenderness to palpation.  MUSCULOSKELETAL:  Demonstrates straight leg test to 40-50 degrees  bilaterally but no reported reproduction of leg pain.  PULSES:  A 1 to 2+ dorsal pedal pulses bilaterally.  NEUROLOGIC:  Diminished sensation bilaterally in lower extremities, 5/5  lower extremity strength bilaterally, and also 5/5 strength upper  extremity bilaterally.  Cranial nerves II-XII intact and grossly  nonfocal.  EXTREMITIES:  Trace 1+ edema bilaterally.  No clubbing or cyanosis.    ADMISSION LABORATORY DATA:  CBC, white blood cell count 9.8, hemoglobin  of 17.3, platelets of 214.  The patient's MCV was 86.3 and RDW was 13.1.  The patient had 3 sets of cardiac enzymes all of which were negative.  The patient had PT/INR and INR was 1.0.  His PT was 13.1.   The patient's  CMET was as follows, sodium 141, potassium 4.0, chloride  of 106, bicarbonate 27, glucose of 94, BUN of 14, creatinine of 1.1.  Bilirubin of 0.8, alk phos of 121, AST of 33, ALT of 44, total protein  7.1, calcium 9.1.   HOSPITAL COURSE:  1. Chronic chest pain with multiple coronary risk factors:  The      patient reported being at his baseline for the chest pain but did      have some mild increase in his shortness of breath, which he      attributed to an asthma flare.  The patient was admitted from the      Outpatient Clinic and Cardiology was consulted.  Dr. Mariah Milling of      [facility] saw the patient and further risk stratified      the patient with a Myoview with the following impression notable      for an equivocal reversible ischemia.  The patient will follow up      with Dr. Lynnea Ferrier in the outpatient setting.  The patient's multiple      risk factors include hypertension and hyperlipidemia, and he was      discharged on Coreg and Zocor 40 mg daily.  The patient was also      given a prescription for nitroglycerin p.r.n. for chest pain.  The      patient's cardiac enzymes were cycled and were negative.  Other      considerations for the patient's chest pain include      gastrointestinal, musculoskeletal, and pulmonary.  The patient had      a Wells score of 0 and a D-dimer was obtained, and his chest x-ray      was negative for any acute pathology.  2. Chronic lower extremity numbness and tingling and chronic back      pain:  The patient has a 7-year history of back pain related to a      work incident.  He had been previously evaluated by a neurosurgeon      and found not to be a  surgical candidate.  His back pain is      relieved with just a TENS unit as he had previously been on      benzodiazepines and narcotics but had self-discontinued them.  The      patient did complain of his chronic back pain upon admission but it      was stable, and he had no signs of an acute surgical emergency,      i.e., no incontinence or saddle anesthesia.  The patient did have      ABIs of his lower extremities, which are not yet back in E-chart,      which will be followed up as an outpatient.  3. Polycythemia:  The patient has a history of elevated hemoglobin in      the outpatient setting with hemoglobins in the 17-18 range and      again was at 17.3 upon hospitalization here.  Per clinic reports,      his last erythropoietin was on the low side of normal, around 5.      He has been evaluated and the consideration of polycythemia vera      had been made in the Outpatient Clinic, but there has been no      confirmatory testing yet.  We will follow up with the patient in      the outpatient setting for a potential  hematology consult to      evaluate the patient's polycythemia.  4. Hyperlipidemia:  The patient had his lipids checked and his LDL was      151.  His HDL was 27.  For these reasons, he was started on Zocor      40 mg daily and will continue with this as an outpatient.  We will      follow up with CMET and a fasting lipid panel in a couple of      months.  5. Hypertension:  The patient's initial blood pressure was 154/88.  He      was put on Coreg and the dose was titrated up to 12.5 mg b.i.d.,      and his blood pressure was stable at 135-140/80-84 on the day of      discharge.   DISCHARGE VITAL SIGNS:  Temperature 97.5, pulse of 75, respiratory rate  of 20, blood pressure 135/84, O2 sat of 97% on room air.   DISCHARGE LABORATORY DATA:  CBC, white blood cell count 9.1, hemoglobin  17.4, platelet count 222.   PENDING LABORATORY DATA:  Please follow up the patient's  echo and ABIs  from this admission.      Linward Foster, MD  Electronically Signed      Acey Lav, MD  Electronically Signed    LW/MEDQ  D:  02/29/2008  T:  03/01/2008  Job:  [id number]   cc:   Ritta Slot, MD  Waldemar Dickens, MD

## 2010-05-26 NOTE — Procedures (Signed)
NAME:  Eduardo Duncan, Eduardo Duncan NO.:  [id number]   MEDICAL RECORD NO.:  [id number]          PATIENT TYPE:  OUT   LOCATION:  SLEEP CENTER                 FACILITY[facility]  [facility]   PHYSICIAN:  Clinton D. Maple Hudson, MD, FCCP, FACPDATE OF BIRTH:  October 18, 1958   DATE OF STUDY:  03/08/2007                            NOCTURNAL POLYSOMNOGRAM   REFERRING PHYSICIAN:  Hollace Hayward, M.D.   INDICATIONS FOR PROCEDURE:  Hypersomnia with sleep apnea.   RESULTS:  Epward sleepiness score 5/24, BMI 43.1, weight 340 pounds.  Height 74.5 inches. Neck 19 inches.   HOME MEDICATIONS:  Charted and reviewed.   SLEEP ARCHITECTURE:  Total sleep time 232 minutes with sleep efficiency  56.4%. Stage 1 was 16.4%; Stage 2, 65.3%; Stage 3 absent. REM 18.3% of  total sleep time. Sleep latency 8.5 minutes. REM latency 81.5 minutes.  Awake after sleep onset 171 minutes. Arousal index 33.9, which is  increased, indicating increased EEG arousal. No bedtime medication was  taken.   RESPIRATORY DATA:  Apnea hypopnea index (AHI) 56.1, indicating severe  obstructive sleep apnea/hypopnea syndrome. There were 217 respiratory  events, including 84 obstructive apnea's, 1 central apnea, 1 mixed  apnea, and 131 hypopnea's. Events were not positional. REM AHI 53.6. The  technician was unable to perform a split protocol CPAP titration study  as requested because the patient was unable to maintain sleep, waking  frequently after midnight and full awake from 1:00 a.m. until almost  3:00 a.m., before finally regaining sleep.   OXYGEN DATA:  Very loud snoring with oxygen desaturation to a nadir of  77%. Mean oxygen saturation through the study was 92.4% on room air.   CARDIAC DATA:  Sinus rhythm with PVC's, including some ventricular  trigeminy.   MOVEMENT/PARASOMNIA:  No significant movement disturbance. Bathroom x1.   IMPRESSION/RECOMMENDATION:  1. Severe obstructive sleep apnea/hypopnea syndrome, AHI 56.1 per hour      with  non-positional events, loud snoring, and oxygen desaturation      to a nadir of 77%.  2. Inability to maintain sleep with sustained waking from just before      1:00 a.m. until nearly 3:00 a.m. This prevented CPAP titration by      split protocol during this study night. Consider return for CPAP      titration or      evaluate for alternative therapies as appropriate. It may help to      bring a sleep medication or analgesic as appropriate for a return      study, if clinically appropriate.      Clinton D. Maple Hudson, MD, [facility], FACP  Diplomate, Biomedical engineer of Sleep Medicine  Electronically Signed     CDY/MEDQ  D:  03/11/2007 16:39:42  T:  03/12/2007 16:55:51  Job:  [id number]

## 2010-05-26 NOTE — Procedures (Signed)
NAME:  Eduardo Duncan, Eduardo Duncan NO.:  [id number]   MEDICAL RECORD NO.:  [id number]          PATIENT TYPE:  OUT   LOCATION:  SLEEP CENTER                 FACILITY:  [facility]   PHYSICIAN:  Clinton D. Maple Hudson, MD, FCCP, FACPDATE OF BIRTH:  Dec 07, 1960   DATE OF STUDY:  04/10/2007                            NOCTURNAL POLYSOMNOGRAM   REFERRING PHYSICIAN:   REFERRING PHYSICIAN:  Tyler Deis, MD.   INDICATION FOR STUDY:  Hypersomnia with sleep apnea.   EPWORTH SLEEPINESS SCORE:  3/24, BMI 43.1, weight 340 pounds, height  74.5 inches, neck 19 inches.   HOME MEDICATIONS:  Charted and reviewed.   SLEEP ARCHITECTURE:  Total sleep time 407 minutes, the sleep efficiency  95.9%.  Stage 1 was 2.5%, stage 2 72.9%, stage 3 0.5%, REM 24.2% of  total sleep time.  Sleep latency 8.5 minutes, REM latency 104 minutes,  awake after sleep onset 9 minutes, arousal index 4.6.  He took Percocet  before arrival at the sleep center.   RESPIRATORY DATA:  CPAP titration protocol.  CPAP was titrated to 13  CWP, AHI 0 per hour.  He chose a large mirage Quattro mask with heated  humidifier.   OXYGEN DATA:  Snoring was prevented at final CPAP pressures with oxygen  saturation held at a mean of 93.2% on CPAP with room air.   CARDIAC DATA:  Normal sinus rhythm.   MOVEMENT/PARASOMNIA:  No significant movement disturbance.  No bathroom  trips.   IMPRESSION/RECOMMENDATION:  1. Successful continuous positive airway pressure titration to 13      centimeters of water, apnea/hypopnea index 0 per hour.  He chose a      large mirage Quattro full face mask with heated humidifier.  2. Baseline diagnostic nocturnal polysomnography on March 08, 2007,      recorded an apnea/hypopnea index of 56.1 per hour, desaturating to      77%.      Clinton D. Maple Hudson, MD, FCCP, FACP  Diplomate, Biomedical engineer of Sleep Medicine  Electronically Signed     CDY/MEDQ  D:  04/15/2007 14:06:03  T:  04/15/2007 14:53:53   Job:  [id number]

## 2010-06-02 ENCOUNTER — Other Ambulatory Visit (INDEPENDENT_AMBULATORY_CARE_PROVIDER_SITE_OTHER): Payer: Medicare Other | Admitting: Internal Medicine

## 2010-06-02 DIAGNOSIS — I10 Essential (primary) hypertension: Secondary | ICD-10-CM

## 2010-06-04 ENCOUNTER — Other Ambulatory Visit (INDEPENDENT_AMBULATORY_CARE_PROVIDER_SITE_OTHER): Payer: Medicare Other | Admitting: *Deleted

## 2010-06-04 DIAGNOSIS — J45909 Unspecified asthma, uncomplicated: Secondary | ICD-10-CM

## 2010-06-04 MED ORDER — ALBUTEROL 90 MCG/ACT IN AERS: 2.0000 | INHALATION_SPRAY | Freq: Four times a day (QID) | RESPIRATORY_TRACT | Status: DC | PRN

## 2010-06-04 NOTE — Telephone Encounter (Signed)
Needs a 90 day supply.

## 2010-06-23 ENCOUNTER — Encounter: Payer: Medicare Other | Attending: Physical Medicine & Rehabilitation

## 2010-06-23 ENCOUNTER — Ambulatory Visit: Payer: Medicare Other | Admitting: Physical Medicine & Rehabilitation

## 2010-06-23 DIAGNOSIS — M47817 Spondylosis without myelopathy or radiculopathy, lumbosacral region: Secondary | ICD-10-CM

## 2010-06-23 DIAGNOSIS — R29898 Other symptoms and signs involving the musculoskeletal system: Secondary | ICD-10-CM | POA: Insufficient documentation

## 2010-06-23 DIAGNOSIS — IMO0001 Reserved for inherently not codable concepts without codable children: Secondary | ICD-10-CM | POA: Insufficient documentation

## 2010-06-23 DIAGNOSIS — M5030 Other cervical disc degeneration, unspecified cervical region: Secondary | ICD-10-CM | POA: Insufficient documentation

## 2010-06-23 DIAGNOSIS — R209 Unspecified disturbances of skin sensation: Secondary | ICD-10-CM | POA: Insufficient documentation

## 2010-06-23 DIAGNOSIS — M48061 Spinal stenosis, lumbar region without neurogenic claudication: Secondary | ICD-10-CM

## 2010-06-24 NOTE — Assessment & Plan Note (Signed)
REASON FOR VISIT:  Right foot numbness as well as back pain.  A 50 year old male with history of lumbar stenosis.  He has had intermittent radicular symptoms.  He had an episode where his right foot went numb on him.  He notices when he first got up, he had to walk with a walker for couple hours before this got better.  He does not note any trauma before that incident.  He does not recall any type of unusual sleeping positions.  His average pain is in the 6-7/10 range.  His pain is described as sharp, burning, constant, tingling and aching.  Sleep is fair.  Pain is worse with walking, bending, standing; improves with rest, heat and TENS.  His walking tolerance is 10 minutes.  He is able to climb steps.  He is able to drive.  He needs some help with wiping himself due to poor range of motion.  He is morbidly obese.  His review of systems is weakness, numbness, dizziness.  He has some sinus issues.  He still has some mid back pain and thoracic spondylosis.  HISTORY:  Social, lives alone single.  FAMILY HISTORY:  Heart disease, diabetes, hypertension.  PHYSICAL EXAMINATION:  VITAL SIGNS:  Blood pressure 116/65, pulse 77, respiratory rate is 18 and O2 sat 96% on room air. GENERAL:  Mood and affect are appropriate. BACK:  His back has mild tenderness at the lumbosacral junction.  Lumbar range of motion is good and forward flexion, extension is limited to about 50%.  Straight leg raising test is negative.  He has no evidence of dysvascular changes. EXTREMITIES:  No swelling in the lower extremities.  No hypersensitivity to touch.  Normal pulses.  Normal deep tendon reflexes. Normal sensation in bilateral lower extremities.  Normal strength in bilateral lower extremities.  Good muscle bulk.  No evidence of intrinsic atrophy.  IMPRESSION: 1. Episode of lower extremity numbness, right lower extremity sounds     like it was mainly in the peroneal distribution, but no residual      deficit. 2. Lumbar spinal stenosis.  No evidence of recurrent radiculopathy.  PLAN:  We will continue his current regimen of intermittent ibuprofen. He takes about 1200-1600 mg every other day or so.  Depending on his symptomatology, he may take some Pepcid with this if he has some stomach upset.  He also alternates with Tylenol, but really does not take more than 4 Tylenol tablets per day.  We also discussed some range of motion exercises to increase truncal rotation, and I gave him the sheet to guide him on this.     Erick Colace, M.D. Electronically Signed    AEK/MedQ D:  06/23/2010 15:31:49  T:  06/24/2010 05:17:30  Job #:  [id number]

## 2010-08-28 ENCOUNTER — Encounter: Payer: Self-pay | Admitting: Internal Medicine

## 2010-08-28 ENCOUNTER — Ambulatory Visit (INDEPENDENT_AMBULATORY_CARE_PROVIDER_SITE_OTHER): Payer: Medicare Other | Admitting: Internal Medicine

## 2010-08-28 DIAGNOSIS — J329 Chronic sinusitis, unspecified: Secondary | ICD-10-CM

## 2010-08-28 DIAGNOSIS — K1370 Unspecified lesions of oral mucosa: Secondary | ICD-10-CM

## 2010-08-28 DIAGNOSIS — R21 Rash and other nonspecific skin eruption: Secondary | ICD-10-CM

## 2010-08-28 MED ORDER — TRIAMCINOLONE ACETONIDE 0.025 % EX OINT: TOPICAL_OINTMENT | Freq: Two times a day (BID) | CUTANEOUS | Status: DC

## 2010-08-28 MED ORDER — LORATADINE 10 MG PO CAPS: 10.0000 mg | ORAL_CAPSULE | Freq: Every day | ORAL | Status: DC

## 2010-08-28 NOTE — Patient Instructions (Signed)
Please schedule a follow up appointment with your PCP in 1-2 months. Please take your medicnes as prescribed. Please call the clinic if your symptoms get worse.

## 2010-08-28 NOTE — Progress Notes (Signed)
  Subjective:    Patient ID: Eduardo Duncan, male    DOB: 1960/02/21, 50 y.o.   MRN: [id number]  HPI: 50 year old man with past medical history significant for type 2 diabetes, hypertension comes to the clinic for a rash for 10 days.  He was in his usual state of health until 10 days ago when he noticed a rash in his groin fold. He states that he started using a new new subtype( flavor) of the old brand about a month ago. He also states that he has been swimming at a semiprivate pool and was just wondering if it could be related to that. Reports occasional itchy but denies any pain with the rash.  He also expresses some concerns about the older lesions that he noticed about a week ago. Marland Kitchen He has been using a bridge for his upper teeth which touches the back of his palate. He states that its getting better and denies any pain or bleeding associated with it. He reports noticing similar kind of lesions in the past as well. He was just wondering if there is any correlation between the rash and the oral lesions.  Denies any systemic complaints including fever, chest pain, shortness of breath ,nausea or vomiting.    Review of Systems  Constitutional: Negative for fever, activity change and appetite change.  HENT: Positive for mouth sores. Negative for congestion, sore throat, rhinorrhea, sneezing and postnasal drip.   Eyes: Negative for photophobia and visual disturbance.  Respiratory: Negative for apnea, cough, choking, chest tightness, shortness of breath and stridor.   Cardiovascular: Negative for chest pain, palpitations and leg swelling.  Gastrointestinal: Negative for nausea, vomiting, abdominal pain, constipation and blood in stool.  Genitourinary: Negative for dysuria, urgency and flank pain.  Musculoskeletal: Negative for arthralgias.  Skin: Positive for rash.  Neurological: Negative for facial asymmetry, light-headedness and headaches.  Hematological: Negative for adenopathy.         Objective:   Physical Exam  Constitutional: He is oriented to person, place, and time. He appears well-developed and well-nourished. No distress.  HENT:  Head: Normocephalic and atraumatic.  Mouth/Throat: No oropharyngeal exudate.       Slight erosion noticed on his hard palate.  Eyes: Conjunctivae and EOM are normal. Pupils are equal, round, and reactive to light.  Neck: Normal range of motion. Neck supple. No JVD present. No tracheal deviation present. No thyromegaly present.  Cardiovascular: Normal rate, regular rhythm, normal heart sounds and intact distal pulses.  Exam reveals no gallop and no friction rub.   No murmur heard. Pulmonary/Chest: Effort normal and breath sounds normal. No stridor. No respiratory distress. He has no wheezes. He has no rales. He exhibits no tenderness.  Abdominal: Soft. Bowel sounds are normal. He exhibits no distension. There is no tenderness. There is no rebound.  Musculoskeletal: Normal range of motion. He exhibits no edema and no tenderness.  Lymphadenopathy:    He has no cervical adenopathy.  Neurological: He is alert and oriented to person, place, and time. He has normal reflexes. He displays normal reflexes. No cranial nerve deficit. Coordination normal.  Skin: Rash noted. He is not diaphoretic.       maculopapular rash noticed on his left groin fold.          Assessment & Plan:

## 2010-08-31 DIAGNOSIS — K1370 Unspecified lesions of oral mucosa: Secondary | ICD-10-CM | POA: Insufficient documentation

## 2010-08-31 DIAGNOSIS — R21 Rash and other nonspecific skin eruption: Secondary | ICD-10-CM | POA: Insufficient documentation

## 2010-08-31 NOTE — Assessment & Plan Note (Signed)
Differentials for his rash include contact dermatitis versus heat rash vs atopy. Will give him a trial of topical steroids. He was advised to call the clinic if this starts getting worse over 1-2 weeks. He voices clear understanding.

## 2010-08-31 NOTE — Assessment & Plan Note (Signed)
Slight erosion noticed on the hard palate. I think this is most likely related to the use of the bridge for his front teeth. The patient himself reports it getting better-denies any pain or bleeding associated with it. Will continue to monitor.

## 2010-09-30 ENCOUNTER — Other Ambulatory Visit: Payer: Self-pay | Admitting: Internal Medicine

## 2010-10-12 LAB — CBC
Platelets: 214
RBC: 5.86 — ABNORMAL HIGH
WBC: 9.8

## 2010-10-12 LAB — COMPREHENSIVE METABOLIC PANEL WITH GFR
ALT: 44
AST: 33
Albumin: 4.1
Alkaline Phosphatase: 121 — ABNORMAL HIGH
BUN: 14
CO2: 27
Calcium: 9.1
Chloride: 106
Creatinine, Ser: 1.1
GFR calc non Af Amer: >60
Glucose, Bld: 94
Potassium: 4
Sodium: 141
Total Bilirubin: 0.8
Total Protein: 7.1

## 2010-10-12 LAB — DIFFERENTIAL
Basophils Absolute: 0.1
Basophils Relative: 2 — ABNORMAL HIGH
Eosinophils Absolute: 0.2
Eosinophils Relative: 2
Lymphocytes Relative: 29
Lymphs Abs: 2.8
Monocytes Absolute: 0.8
Monocytes Relative: 8
Neutro Abs: 5.9
Neutrophils Relative %: 59

## 2010-10-12 LAB — PROTIME-INR
INR: 1
Prothrombin Time: 13.1

## 2010-10-12 LAB — POCT CARDIAC MARKERS
CKMB, poc: 2.1
Myoglobin, poc: 132

## 2010-10-12 LAB — URINALYSIS, ROUTINE W REFLEX MICROSCOPIC
Bilirubin Urine: NEGATIVE
Glucose, UA: NEGATIVE
Hgb urine dipstick: NEGATIVE
Ketones, ur: NEGATIVE
Nitrite: NEGATIVE
Protein, ur: NEGATIVE
Specific Gravity, Urine: 1.019
Urobilinogen, UA: 1
pH: 7

## 2010-10-19 ENCOUNTER — Ambulatory Visit (INDEPENDENT_AMBULATORY_CARE_PROVIDER_SITE_OTHER): Payer: Medicare Other | Admitting: Internal Medicine

## 2010-10-19 ENCOUNTER — Encounter: Payer: Self-pay | Admitting: Internal Medicine

## 2010-10-19 DIAGNOSIS — I10 Essential (primary) hypertension: Secondary | ICD-10-CM

## 2010-10-19 DIAGNOSIS — J45909 Unspecified asthma, uncomplicated: Secondary | ICD-10-CM

## 2010-10-19 MED ORDER — NEBIVOLOL HCL 5 MG PO TABS: ORAL_TABLET | ORAL | Status: DC

## 2010-10-19 MED ORDER — OLMESARTAN MEDOXOMIL 20 MG PO TABS: ORAL_TABLET | ORAL | Status: DC

## 2010-10-19 NOTE — Patient Instructions (Signed)
Stop coreg for now as it interferes with albuterol but once you are better with no need for albuterol it's ok to take it  In meantime take samples of bystolic 5 mg twice daily  Stop lisinopril indefinitely > the symptoms you are having are classic for an ACE effect  Start Benicar 20mg  one half daily  > if all better, pulmonary is as needed but you will need to get your doctor to prescribe this or a non-ACE alternative going forward.

## 2010-10-19 NOTE — Assessment & Plan Note (Signed)

## 2010-10-19 NOTE — Progress Notes (Signed)
Subjective:     Patient ID: Eduardo Duncan, male   DOB: 1960/06/17, 50 y.o.   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 Since that time he said he said symptoms everyday when eval here 03/15/07  Started on symbicort 80 last ov and comes back all smiles no limitations, climbing stairs ok sleep all night with no resp disturbance.   July 28, 2009 ov flared since first of June 2011 on non-specific beta blockers but now no longer needing albuterol but needing gallbladder surgery 7/21 and requesting clearance. no sob, cough.  rec off coreg periop >prefer in this setting: Bystolic, the most beta -1  selective Beta blocker available in sample form, with bisoprolol the most selective generic choice  on the market.    10/19/2010 f/u ov/Eduardo Duncan on ACE cc 6 weeks more short of breath, dry cough, increase need for albuterol. Also tol cpap poorly with waking up choking and also sometimes sense of choking on food.  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 Venetia Maxon from [facility] brain and spine specialists:    -Recommended that he undergo lumbar spinal epidural injections and pain management treatment as he did not think the patient was a surgical candidate due to asthma and general comorbidities. He is to f/u on as needed basis and recommended seeing Dr. Wynn Banker for pain managment and injection therapy (decision to hold off 2/2 psych reactions with steroids).  ??Polycythemia vera  Dizziness (chronic)  Prostatitis 2005, ? UTI in his 68s  ??gout (no synovial fluid analysis)  Cholecystitis >> cleared for sugery by Pulmonary July 28, 2009    Review of Systems     Objective:   Physical Exam  in general, obese white male in no acute distress  wt 356 > 353 July 29, 2009 >  353 10/19/2010  HEENT: nl dentition, moderate nonspecific swelling of the turbinates, and orophanx. Nl external ear canals without cough reflex  Neck without JVD/Nodes/TM  Lungs clear to A and P bilaterally without cough on insp or exp maneuvers  RRR no s3 or murmur or increase in P2  Abd soft and massively obese but otherwise benign with nl excursion in the supine position. No bruits or organomegaly  Ext warm without calf tenderness, cyanosis clubbing or edema  Skin warm and dry without lesions    Assessment:          Plan:

## 2010-10-19 NOTE — Assessment & Plan Note (Signed)
Until the last month or so rarely needed saba even on coreg and now Symptoms are markedly disproportionate to objective findings and not clear this is a lung problem but pt does appear to have difficult airway management issues.   DDX of  difficult airways managment all start with A and  include Adherence, Ace Inhibitors, Acid Reflux, Active Sinus Disease, Alpha 1 Antitripsin deficiency, Anxiety masquerading as Airways dz,  ABPA,  allergy(esp in young), Aspiration (esp in elderly), Adverse effects of DPI,  Active smokers, plus two Bs  = Bronchiectasis and Beta blocker use..and one C= CHF  ? Ace inhibitor effects> strongly suggested by exam and choking hx.  Try off acei and on benicar x one month then regroup if not better  ? BB effect.  Strongly prefer in this setting: Bystolic, the most beta -1  selective Beta blocker available in sample form, with bisoprolol the most selective generic choice  on the market. Once airways stable again ok to use coreg

## 2010-10-29 ENCOUNTER — Other Ambulatory Visit: Payer: Self-pay | Admitting: Internal Medicine

## 2010-10-30 NOTE — Telephone Encounter (Signed)
Please call Mr. Mankey and tell him he needs an appointment before we do anymore refills.  Thanks!

## 2010-11-30 ENCOUNTER — Other Ambulatory Visit: Payer: Self-pay | Admitting: Internal Medicine

## 2010-12-09 ENCOUNTER — Ambulatory Visit (INDEPENDENT_AMBULATORY_CARE_PROVIDER_SITE_OTHER): Payer: Medicare Other | Admitting: Internal Medicine

## 2010-12-09 ENCOUNTER — Encounter: Payer: Self-pay | Admitting: Internal Medicine

## 2010-12-09 VITALS — BP 130/86 | HR 86 | Temp 97.5°F | Ht 76.0 in | Wt 359.5 lb

## 2010-12-09 DIAGNOSIS — I10 Essential (primary) hypertension: Secondary | ICD-10-CM

## 2010-12-09 DIAGNOSIS — J45909 Unspecified asthma, uncomplicated: Secondary | ICD-10-CM

## 2010-12-09 DIAGNOSIS — K219 Gastro-esophageal reflux disease without esophagitis: Secondary | ICD-10-CM

## 2010-12-09 DIAGNOSIS — R42 Dizziness and giddiness: Secondary | ICD-10-CM

## 2010-12-09 DIAGNOSIS — E669 Obesity, unspecified: Secondary | ICD-10-CM

## 2010-12-09 DIAGNOSIS — M48061 Spinal stenosis, lumbar region without neurogenic claudication: Secondary | ICD-10-CM

## 2010-12-09 LAB — LIPID PANEL
Cholesterol: 159 mg/dL (ref 0–200)
HDL: 40 mg/dL (ref >39)
Triglycerides: 145 mg/dL (ref <150)

## 2010-12-09 MED ORDER — OLMESARTAN MEDOXOMIL 5 MG PO TABS: ORAL_TABLET | ORAL | Status: DC

## 2010-12-09 MED ORDER — MECLIZINE HCL 12.5 MG PO TABS: 12.5000 mg | ORAL_TABLET | Freq: Three times a day (TID) | ORAL | Status: DC | PRN

## 2010-12-09 NOTE — Assessment & Plan Note (Signed)
MNT referral to [facility] for counseling.

## 2010-12-09 NOTE — Assessment & Plan Note (Signed)
Filed Vitals:   12/09/10 0958  BP: 130/86  Pulse: 86  Temp:   Well controlled. No change in meds today. Orthostatic vitals reviewed and no orthostasis present.

## 2010-12-09 NOTE — Assessment & Plan Note (Signed)
Well controlled at this time. Followed by Dr. Sherene Sires.

## 2010-12-09 NOTE — Progress Notes (Signed)
  Subjective:    Patient ID: Eduardo Duncan, male    DOB: 22-Feb-1960, 50 y.o.   MRN: [id number]  HPI Eduardo Duncan is a 50 year old man who is here today for a regular follow up. Patient has multiple complains today.  I asked him about 1 problem that we can talk about. He started complaining of back pain which is being managed by Dr Wynn Banker and he has an appointment coming up next week. We started talking about his next complaint ie dizziness. He has had dizziness for about 2 years now. He has had 3-4 different visits to ED for dizziness when CT scans were done and no intracranial pathology was found. He was being managed by Dr. Onalee Hua in the past on meclizine. He said that it worked well for him until he started having nausea and Gi upset from it. He described lightheadedness which is more like a spinning of room around him sensation. It is related to pain medications as when he takes his ibuprofen the dizziness is relieved. The dizziness is not related to position or any specific time of the day. He has had sinus problem for many years and he thinks that this might be related to sinus. One time his neurosurgeon said that that his dizziness may be related to back pain.    Review of Systems  Constitutional: Negative for fever, activity change and appetite change.  HENT: Negative for sore throat.   Respiratory: Negative for cough and shortness of breath.   Cardiovascular: Negative for chest pain and leg swelling.  Gastrointestinal: Negative for nausea, abdominal pain, diarrhea, constipation and abdominal distention.  Genitourinary: Negative for frequency, hematuria and difficulty urinating.  Musculoskeletal: Positive for myalgias, back pain and arthralgias.  Neurological: Positive for dizziness and light-headedness. Negative for headaches.  Psychiatric/Behavioral: Negative for suicidal ideas and behavioral problems.       Objective:   Physical Exam  Constitutional: He is oriented to person,  place, and time. He appears well-developed and well-nourished.  HENT:  Head: Normocephalic and atraumatic.  Eyes: Conjunctivae and EOM are normal. Pupils are equal, round, and reactive to light. No scleral icterus.  Neck: Normal range of motion. Neck supple. No JVD present. No thyromegaly present.  Cardiovascular: Normal rate, regular rhythm, normal heart sounds and intact distal pulses.  Exam reveals no gallop and no friction rub.   No murmur heard. Pulmonary/Chest: Effort normal and breath sounds normal. No respiratory distress. He has no wheezes. He has no rales.  Abdominal: Soft. Bowel sounds are normal. He exhibits no distension and no mass. There is no tenderness. There is no rebound and no guarding.  Musculoskeletal: Normal range of motion. He exhibits tenderness (lower back). He exhibits no edema.  Lymphadenopathy:    He has no cervical adenopathy.  Neurological: He is alert and oriented to person, place, and time.  Psychiatric: He has a normal mood and affect. His behavior is normal.          Assessment & Plan:

## 2010-12-09 NOTE — Assessment & Plan Note (Signed)
I will prescribe him low dose meclizine by keeping in mind that he had side effects in the past. I think his dizziness is related to peripheral vertigo, given that it was relieved with meclizine in the past. BPPV is less likely based on history. Vestibular neuritis could be a possibilty. No h/o concussion or mass on CT head rules out acoustic neuroma and meniere disease. I will have him referred to PT to help with positional exercises if this is 2/2 BPPV and will prescribe meclizine as it seemed to have helped in the past.

## 2010-12-09 NOTE — Assessment & Plan Note (Signed)
Managed by Dr. Wynn Banker.

## 2010-12-09 NOTE — Assessment & Plan Note (Signed)
Thinks that it may be coming back and wants to restart zantac.

## 2010-12-09 NOTE — Patient Instructions (Signed)
Calorie Counting Diet A calorie counting diet requires you to eat the number of calories that are right for you in a day. Calories are the measurement of how much energy you get from the food you eat. Eating the right amount of calories is important for staying at a healthy weight. If you eat too many calories, your body will store them as fat and you may gain weight. If you eat too few calories, you may lose weight. Counting the number of calories you eat during a day will help you know if you are eating the right amount. A Registered Dietitian can determine how many calories you need in a day. The amount of calories needed varies from person to person. If your goal is to lose weight, you will need to eat fewer calories. Losing weight can benefit you if you are overweight or have health problems such as heart disease, high blood pressure, or diabetes. If your goal is to gain weight, you will need to eat more calories. Gaining weight may be necessary if you have a certain health problem that causes your body to need more energy. TIPS Whether you are increasing or decreasing the number of calories you eat during a day, it may be hard to get used to changes in what you eat and drink. The following are tips to help you keep track of the number of calories you eat.  Measure foods at home with measuring cups. This helps you know the amount of food and number of calories you are eating.   Restaurants often serve food in amounts that are larger than 1 serving. While eating out, estimate how many servings of a food you are given. For example, a serving of cooked rice is  cup or about the size of half of a fist. Knowing serving sizes will help you be aware of how much food you are eating at restaurants.   Ask for smaller portion sizes or child-size portions at restaurants.   Plan to eat half of a meal at a restaurant. Take the rest home or share the other half with a friend.   Read the Nutrition Facts panel on  food labels for calorie content and serving size. You can find out how many servings are in a package, the size of a serving, and the number of calories each serving has.   For example, a package might contain 3 cookies. The Nutrition Facts panel on that package says that 1 serving is 1 cookie. Below that, it will say there are 3 servings in the container. The calories section of the Nutrition Facts label says there are 90 calories. This means there are 90 calories in 1 cookie (1 serving). If you eat 1 cookie you have eaten 90 calories. If you eat all 3 cookies, you have eaten 270 calories (3 servings x 90 calories = 270 calories).  The list below tells you how big or small some common portion sizes are.  1 oz.........4 stacked dice.   3 oz.........Deck of cards.   1 tsp........Tip of little finger.   1 tbs........Thumb.   2 tbs........Golf ball.    cup.......Half of a fist.   1 cup........A fist.  KEEP A FOOD LOG Write down every food item you eat, the amount you eat, and the number of calories in each food you eat during the day. At the end of the day, you can add up the total number of calories you have eaten. It may help to keep a   list like the one below. Find out the calorie information by reading the Nutrition Facts panel on food labels. Breakfast  Bran cereal (1 cup, 110 calories).   Fat-free milk ( cup, 45 calories).  Snack  Apple (1 medium, 80 calories).  Lunch  Spinach (1 cup, 20 calories).   Tomato ( medium, 20 calories).   Chicken breast strips (3 oz, 165 calories).   Shredded cheddar cheese ( cup, 110 calories).   Light Italian dressing (2 tbs, 60 calories).   Whole-wheat bread (1 slice, 80 calories).   Tub margarine (1 tsp, 35 calories).   Vegetable soup (1 cup, 160 calories).  Dinner  Pork chop (3 oz, 190 calories).   Brown rice (1 cup, 215 calories).   Steamed broccoli ( cup, 20 calories).   Strawberries (1  cup, 65 calories).   Whipped  cream (1 tbs, 50 calories).  Daily Calorie Total: 1425 Document Released: 12/28/2004 Document Revised: 09/09/2010 Document Reviewed: 06/24/2006 [employer] Patient Information 2012 [employer]. 

## 2010-12-22 ENCOUNTER — Encounter: Payer: Self-pay | Admitting: *Deleted

## 2010-12-22 ENCOUNTER — Ambulatory Visit: Payer: Medicare Other | Admitting: Dietician

## 2010-12-22 ENCOUNTER — Encounter: Payer: Medicare Other | Attending: Physical Medicine & Rehabilitation

## 2010-12-22 ENCOUNTER — Ambulatory Visit: Payer: Medicare Other | Admitting: Physical Medicine & Rehabilitation

## 2010-12-22 ENCOUNTER — Encounter: Payer: Medicare Other | Admitting: Internal Medicine

## 2010-12-22 DIAGNOSIS — M5430 Sciatica, unspecified side: Secondary | ICD-10-CM

## 2010-12-22 DIAGNOSIS — M47814 Spondylosis without myelopathy or radiculopathy, thoracic region: Secondary | ICD-10-CM | POA: Insufficient documentation

## 2010-12-22 DIAGNOSIS — R209 Unspecified disturbances of skin sensation: Secondary | ICD-10-CM | POA: Insufficient documentation

## 2010-12-22 DIAGNOSIS — M48061 Spinal stenosis, lumbar region without neurogenic claudication: Secondary | ICD-10-CM | POA: Insufficient documentation

## 2010-12-22 DIAGNOSIS — M546 Pain in thoracic spine: Secondary | ICD-10-CM | POA: Insufficient documentation

## 2010-12-22 DIAGNOSIS — M62838 Other muscle spasm: Secondary | ICD-10-CM | POA: Insufficient documentation

## 2010-12-22 DIAGNOSIS — G894 Chronic pain syndrome: Secondary | ICD-10-CM

## 2010-12-22 DIAGNOSIS — M549 Dorsalgia, unspecified: Secondary | ICD-10-CM | POA: Insufficient documentation

## 2010-12-22 DIAGNOSIS — M79609 Pain in unspecified limb: Secondary | ICD-10-CM | POA: Insufficient documentation

## 2010-12-22 NOTE — Progress Notes (Signed)
Addended by: Neomia Dear on: 12/22/2010 04:18 PM   Modules accepted: Orders

## 2010-12-22 NOTE — Progress Notes (Unsigned)
Pt presents this am appr 15 mins late, he states he had a hard time finding parking this am and that is why he is late, he also states he is having issues with insurance paying for benicar, so he needs something more cost efficient . His appt has been rescheduled for 1015 wed 12/12 and it is suggested that pt arrive on grounds at 0945 and use valet parking. He is also informed his medication problems can be discussed at the appt

## 2010-12-23 ENCOUNTER — Ambulatory Visit (INDEPENDENT_AMBULATORY_CARE_PROVIDER_SITE_OTHER): Payer: Medicare Other | Admitting: Dietician

## 2010-12-23 ENCOUNTER — Ambulatory Visit (INDEPENDENT_AMBULATORY_CARE_PROVIDER_SITE_OTHER): Payer: Medicare Other | Admitting: Internal Medicine

## 2010-12-23 ENCOUNTER — Encounter: Payer: Self-pay | Admitting: Internal Medicine

## 2010-12-23 ENCOUNTER — Other Ambulatory Visit: Payer: Self-pay | Admitting: Physical Medicine & Rehabilitation

## 2010-12-23 DIAGNOSIS — M545 Low back pain: Secondary | ICD-10-CM

## 2010-12-23 DIAGNOSIS — J309 Allergic rhinitis, unspecified: Secondary | ICD-10-CM

## 2010-12-23 DIAGNOSIS — E669 Obesity, unspecified: Secondary | ICD-10-CM

## 2010-12-23 DIAGNOSIS — J45909 Unspecified asthma, uncomplicated: Secondary | ICD-10-CM

## 2010-12-23 DIAGNOSIS — G4733 Obstructive sleep apnea (adult) (pediatric): Secondary | ICD-10-CM

## 2010-12-23 DIAGNOSIS — I10 Essential (primary) hypertension: Secondary | ICD-10-CM

## 2010-12-23 MED ORDER — CETIRIZINE HCL 10 MG PO TABS: 10.0000 mg | ORAL_TABLET | Freq: Every day | ORAL | Status: DC

## 2010-12-23 MED ORDER — ALBUTEROL 90 MCG/ACT IN AERS: 2.0000 | INHALATION_SPRAY | Freq: Four times a day (QID) | RESPIRATORY_TRACT | Status: DC | PRN

## 2010-12-23 MED ORDER — CARVEDILOL 12.5 MG PO TABS: 12.5000 mg | ORAL_TABLET | Freq: Two times a day (BID) | ORAL | Status: DC

## 2010-12-23 MED ORDER — LOSARTAN POTASSIUM 25 MG PO TABS: 25.0000 mg | ORAL_TABLET | Freq: Every day | ORAL | Status: DC

## 2010-12-23 NOTE — Progress Notes (Signed)
Subjective:   Patient ID: Eduardo Duncan male   DOB: 08/26/1960 50 y.o.   MRN: [id number]  HPI: Eduardo Duncan is a 50 y.o. man who presents to clinic today with several complaints.    He states that his dizziness is better since stopping his lisinopril.  He has been taking his coreg and Benicar with no problems.  He states that he has had to get samples from the cardiologist office because he can't afford the benicar because his insurance will not pay for it.  He denies any dizziness on standing, headache, blurry vision, or chest pain.  He states that he continues to have problems with sinus congestion and drainage.  He also has been using his albuterol more often since the seasons changed.  He states that when the seasons change it is worse.  He used to be on Allegra but his insurance will not pay for it anymore.  He has tried Claritin but states that it doesn't help much.  He denies fever, chills, nausea, vomiting, sinus pain, or DOE.  He does have some mild SOB that is relieved with his albuterol inhaler.  He also notes that his apartment has mold in every room and has carpet in every room as well except the bathroom.  He has tried cleaning the house and complaining to the landlord but he has not been able to get rid of the mold.  He also notes that there is a hole in the ceiling of the apartment hat is covered by a tarp and has been for over a year.  He is working to find a new place with no carpets and no mold but has a lease that he thinks he will have a hard time getting out of.  He is requesting a letter to get out of his lease for medical reasons.    He also states he has been using his CPAP most nights and he has been sleeping relatively well.  He also continues to have problems with his back pain and weight gain because of the pain.  He has been using his tramadol and it helps him be able to accomplish his ADLs.  Past Medical History  Diagnosis Date  . Allergy   . Dizziness   .  Osteoporosis   . Hypertension   . Depression   . GERD (gastroesophageal reflux disease)   . Hyperlipidemia   . Polycythemia rubra vera   . Obstructive sleep apnea   . Spinal stenosis of lumbar region    Current Outpatient Prescriptions  Medication Sig Dispense Refill  . albuterol (PROVENTIL,VENTOLIN) 90 MCG/ACT inhaler Inhale 2 puffs into the lungs every 6 (six) hours as needed. For shortness of breath  17 g  3  . aspirin (ASPIRIN ADULT LOW STRENGTH) 81 MG EC tablet Take 81 mg by mouth daily.        . carvedilol (COREG) 12.5 MG tablet Take 12.5 mg by mouth 2 (two) times daily with a meal.        . meclizine (ANTIVERT) 12.5 MG tablet Take 1 tablet (12.5 mg total) by mouth 3 (three) times daily as needed for dizziness or nausea.  30 tablet  1  . olmesartan (BENICAR) 5 MG tablet One half daily  30 tablet  2  . simvastatin (ZOCOR) 40 MG tablet Take 1 tablet (40 mg total) by mouth at bedtime.  30 tablet  6  . traMADol (ULTRAM) 50 MG tablet Take 50 mg by mouth 3 (three) times  daily as needed.         Family History  Problem Relation Age of Onset  . Diabetes Mother   . Hypertension Mother   . Alzheimer's disease Mother   . Heart disease Mother   . Heart disease Father    History   Social History  . Marital Status: Single    Spouse Name: N/A    Number of Children: N/A  . Years of Education: N/A   Occupational History  . disability      10/2006--has not worked since 2004   Social History Main Topics  . Smoking status: Former Smoker    Types: Cigarettes    Quit date: 03/13/1986  . Smokeless tobacco: None  . Alcohol Use: No     never--used to drink heavily  . Drug Use: No  . Sexually Active: None   Other Topics Concern  . None   Social History Narrative   Registration notes: Red bag given 05/10/08.   Review of Systems: Constitutional: Denies fever, chills, diaphoresis, appetite change and fatigue.  HEENT: Positive for congestion, rhinorrhea, sneezing.  Denies photophobia,  eye pain, redness, hearing loss, ear pain, sore throat, mouth sores, trouble swallowing, neck pain, neck stiffness and tinnitus.   Respiratory: Denies SOB, DOE, cough, chest tightness,  and wheezing.   Cardiovascular: Denies chest pain, palpitations and leg swelling.  Gastrointestinal: Denies nausea, vomiting, abdominal pain, diarrhea, constipation, blood in stool and abdominal distention.  Genitourinary: Denies dysuria, urgency, frequency, hematuria, flank pain and difficulty urinating.  Musculoskeletal: Denies myalgias, back pain, joint swelling, arthralgias and gait problem.  Skin: Denies pallor, rash and wound.  Neurological: Denies dizziness, seizures, syncope, weakness, light-headedness, numbness and headaches.  Hematological: Denies adenopathy. Easy bruising, personal or family bleeding history  Psychiatric/Behavioral: Denies suicidal ideation, mood changes, confusion, nervousness, sleep disturbance and agitation  Objective:  Physical Exam: Filed Vitals:   12/23/10 1022  BP: 153/96  Pulse: 88  Temp: 97.1 F (36.2 C)  TempSrc: Oral  Height: 6\' 4"  (1.93 m)  Weight: 363 lb 12.8 oz (165.019 kg)   Constitutional: Vital signs reviewed.  Patient is a well-developed and well-nourished man in no acute distress and cooperative with exam. Alert and oriented x3.  Head: Normocephalic and atraumatic Ear: mild erythema bilaterally.  Mouth: no erythema or exudates, MMM Nose: turbinate erythema bilaterally with drainage noted.  Eyes: PERRL, EOMI, conjunctivae injected, No scleral icterus.  Neck: Supple, Trachea midline normal ROM, No JVD, mass, thyromegaly, or carotid bruit present.  Cardiovascular: RRR, S1 normal, S2 normal, no MRG, pulses symmetric and intact bilaterally Pulmonary/Chest: CTAB, no wheezes, rales, or rhonchi Abdominal: Soft. Non-tender, non-distended, bowel sounds are normal, no masses, organomegaly, or guarding present.  GU: no CVA tenderness Musculoskeletal: No joint  deformities, erythema, or stiffness, ROM full and no nontender Hematology: no cervical, inginal, or axillary adenopathy.  Neurological: A&O x3, Strenght is normal and symmetric bilaterally, cranial nerve II-XII are grossly intact, no focal motor deficit, sensory intact to light touch bilaterally.  Skin: Warm, dry and intact. No rash, cyanosis, or clubbing.  Psychiatric: Normal mood and affect. speech and behavior is normal. Judgment and thought content normal. Cognition and memory are normal.   Assessment & Plan:

## 2010-12-23 NOTE — Patient Instructions (Addendum)
1.  Start Losartan 25 mg tablets take 1 tablet daily for your blood pressure.  About 2 weeks after starting the medication please come to the clinic for the follow up on your blood pressure and labs  2.  Start Cetirizine 10 mg tablets take 1 tablet daily for the allergies.  3.  Continue all of your other medications as directed

## 2010-12-23 NOTE — Assessment & Plan Note (Signed)
This is a patient of Dr. Wynn Banker, is seen every 6 months for his back and thigh pain.  He reports that the majority of his pain is in his thoracic spine.  He has had a cervical MRI earlier this year that showed no congenital or acquired change in canal stenosis at L3-L4.  No new nerve root compression.  He states that his legs do bother him.  It feel like, they are going to give out from under him quite often.  He rates his average pain at 6-7.  It is a dull,  stabbing, aching-type pain. General activity level is 4-6.  Sleep patterns are poor.  Walking, bending aggravate.  Rest, heat, and medication helps.  He is taking no medication from [place] at this time, but does have tramadol prescribed.  He walks without assistance.  He can walk 10 minutes at a time and  climb steps and drive.  He is on disability.  REVIEW OF SYSTEMS:  Notable for difficulties as described above as well as some weakness, paresthesias, spasms, dizziness, and anxiety.  No suicidal thoughts or aberrant behaviors.  He has some shortness of breath and sleep apnea and wheezing.  PAST MEDICAL HISTORY:  Unchanged.  SOCIAL HISTORY:  Unchanged.  FAMILY HISTORY:  Unchanged.  PHYSICAL EXAMINATION:  His blood pressure is 139/70, pulse 92, respirations 18, and O2 sats 96 on room air.  His motor strength, sensation are intact in his lower extremities.  Confrontation testing, he is morbidly obese.  He is alert and oriented x3.  His gait is normal.  IMPRESSION:  Thoracic spondylosis with some lumbar aspect of pain.  PLAN: 1. I have talked to him about restarting his tramadol.  We did talk     about another muscle relaxant.  He does not know that he could get     the Skelaxin or Robaxin with his current or new insurance, which is     coming into effect.  He also states he may start back on his     tramadol at least twice a day.  I told him that was fine, we can     keep that filled. 2. We will obtain an MRI of the lumbar  spine without gadolinium and     bring him back to review that, or at least I will give him a call.     He is scheduled for 6 months, but if the MRI shows something     significant I will call and discuss with him and make a proper     referral if needed.  Otherwise, his questions were encouraged and answered.  He is in agreement with this plan.  We will see him back in 6 months or sooner.     Narcisa Ganesh L. Blima Dessert Electronically Signed    RLW/MedQ D:  12/22/2010 15:57:17  T:  12/23/2010 04:38:19  Job #:  [id number]

## 2010-12-23 NOTE — Progress Notes (Signed)
Discussed with patient Medicare benefit for Intensive Behavioral Therapy for Obesity. He plans to start some time after the holidays.

## 2010-12-26 ENCOUNTER — Ambulatory Visit
Admission: RE | Admit: 2010-12-26 | Discharge: 2010-12-26 | Disposition: A | Discharge status: Home / Self Care | Payer: Medicare Other | Source: Ambulatory Visit | Attending: Physical Medicine & Rehabilitation | Admitting: Physical Medicine & Rehabilitation

## 2010-12-26 DIAGNOSIS — M545 Low back pain: Secondary | ICD-10-CM

## 2010-12-29 ENCOUNTER — Telehealth: Payer: Self-pay | Admitting: *Deleted

## 2010-12-29 NOTE — Telephone Encounter (Signed)
Spoke with [employer].  He had stated in his office visit that he had mold in his house so I will make sure its documented in his note.  I will write a letter stating that he has asthma and needs to be in an apartment without carpet and no mold.

## 2010-12-29 NOTE — Telephone Encounter (Signed)
Spoke with Michaelle Copas about her brother needs a note/letter written to say that his current home with mold is making him sick so that he can relocate.  D. Hill to further discuss the issues with Dr. Alla German.  Angelina Ok, RN 12/29/2010 5:01 PM

## 2011-01-01 NOTE — Assessment & Plan Note (Signed)
Lab Results  Component Value Date   NA 139 03/13/2010   K 4.1 03/13/2010   CL 103 03/13/2010   CO2 24 03/13/2010   BUN 10 03/13/2010   CREATININE 1.06 03/13/2010   CREATININE 1.14 07/28/2009    BP Readings from Last 3 Encounters:  12/23/10 138/82  12/09/10 130/86  10/19/10 110/80    Assessment: Hypertension control:  controlled  Progress toward goals:  at goal Barriers to meeting goals:  financial need  Plan: Hypertension treatment:  He is having problems getting the Benicar.  We will switch him to Losartan which should be better covered by his insurance.  He will return in about 2 weeks to recheck his blood pressure to make sure the dose is adequate or if it needs titration.

## 2011-01-01 NOTE — Assessment & Plan Note (Signed)
He states that he has been using his CPAP every night and it helps him sleep.  We will continue to work with [employer] to ensure he is covered and is able to use his machine effectively.

## 2011-01-01 NOTE — Assessment & Plan Note (Signed)
He has been using his albuterol more often because of the season change as well as the mold in his apartment.  We will refill his albuterol.

## 2011-01-01 NOTE — Assessment & Plan Note (Signed)
He used to be on Allegra but now his insurance will not pay for it and the OTC version is too expensive.  He has tried Claritin but it doesn't seem to work.  We will try him on Zyrtec and see if that works better for him.  I will also write a letter for him so he can look for a home that has no carpets and definitely no mold.

## 2011-02-26 ENCOUNTER — Encounter: Payer: Self-pay | Admitting: Internal Medicine

## 2011-02-26 ENCOUNTER — Ambulatory Visit (INDEPENDENT_AMBULATORY_CARE_PROVIDER_SITE_OTHER): Payer: Medicare Other | Admitting: Internal Medicine

## 2011-02-26 VITALS — BP 148/93 | HR 89 | Temp 98.6°F | Ht 76.0 in | Wt 364.6 lb

## 2011-02-26 DIAGNOSIS — M545 Low back pain, unspecified: Secondary | ICD-10-CM

## 2011-02-26 DIAGNOSIS — J309 Allergic rhinitis, unspecified: Secondary | ICD-10-CM

## 2011-02-26 DIAGNOSIS — I10 Essential (primary) hypertension: Secondary | ICD-10-CM

## 2011-02-26 DIAGNOSIS — Z299 Encounter for prophylactic measures, unspecified: Secondary | ICD-10-CM

## 2011-02-26 LAB — BASIC METABOLIC PANEL
BUN: 12 mg/dL (ref 6–23)
Chloride: 108 mEq/L (ref 96–112)
Glucose, Bld: 96 mg/dL (ref 70–99)
Potassium: 4.5 mEq/L (ref 3.5–5.3)

## 2011-02-26 MED ORDER — CHLORPHENIRAMINE MALEATE CR 8 MG PO CPCR: 8.0000 mg | ORAL_CAPSULE | Freq: Two times a day (BID) | ORAL | Status: AC

## 2011-02-26 MED ORDER — PNEUMOCOCCAL VAC POLYVALENT 25 MCG/0.5ML IJ INJ: 0.5000 mL | INJECTION | Freq: Once | INTRAMUSCULAR | Status: DC

## 2011-02-26 NOTE — Progress Notes (Signed)
Subjective:   Patient ID: Eduardo Duncan male   DOB: 1960/11/26 51 y.o.   MRN: [id number]  HPI: Eduardo Duncan is a 51 y.o. man who presents today for follow up from his last appointment.  He has gotten his new apartment since our last visit with wood floors and states that his shortness of breath has been much improved as well as his nasal drainage.  He still had occasional problems with dyspnea with moderate activity but states that he is remarkably better.  He doesn't know if the Zyrtec is helping and states that he thinks he still feels something going down the back of his throat which causes him to cough.   At his last visit we had started him on Losartan but he has not started taking it because of concern for side effects.  He was reading the package insert and was worried and wanted to discuss it more.  He denies any problems with headaches, dizziness, or blurry vision since his last appointment.    He states that he has had problems with his back pain lately and has been taking a lot of ibuprofen.  He states that he has been taking 1-2 tablets 3 times daily because of the pain.  He states the pain is worse with activity and with all the lifting he did when he moved the pain flared up.  He denies any numbness or tingling in his legs or pelvic area, or loss of bowel or bladder function.    Past Medical History  Diagnosis Date  . Allergy   . Dizziness   . Osteoporosis   . Hypertension   . Depression   . GERD (gastroesophageal reflux disease)   . Hyperlipidemia   . Polycythemia rubra vera   . Obstructive sleep apnea   . Spinal stenosis of lumbar region    Current Outpatient Prescriptions  Medication Sig Dispense Refill  . albuterol (PROVENTIL,VENTOLIN) 90 MCG/ACT inhaler Inhale 2 puffs into the lungs every 6 (six) hours as needed. For shortness of breath  17 g  3  . aspirin (ASPIRIN ADULT LOW STRENGTH) 81 MG EC tablet Take 81 mg by mouth daily.        . carvedilol (COREG) 12.5  MG tablet Take 1 tablet (12.5 mg total) by mouth 2 (two) times daily with a meal.  60 tablet  11  . cetirizine (ZYRTEC) 10 MG tablet Take 1 tablet (10 mg total) by mouth daily.      Marland Kitchen losartan (COZAAR) 25 MG tablet Take 1 tablet (25 mg total) by mouth daily.  30 tablet  11  . simvastatin (ZOCOR) 40 MG tablet Take 1 tablet (40 mg total) by mouth at bedtime.  30 tablet  6  . traMADol (ULTRAM) 50 MG tablet Take 50 mg by mouth 3 (three) times daily as needed.         Family History  Problem Relation Age of Onset  . Diabetes Mother   . Hypertension Mother   . Alzheimer's disease Mother   . Heart disease Mother   . Heart disease Father    History   Social History  . Marital Status: Single    Spouse Name: N/A    Number of Children: N/A  . Years of Education: N/A   Occupational History  . disability      10/2006--has not worked since 2004   Social History Main Topics  . Smoking status: Former Smoker    Types: Cigarettes    Quit  date: 03/13/1986  . Smokeless tobacco: None  . Alcohol Use: No     never--used to drink heavily  . Drug Use: No  . Sexually Active: None   Other Topics Concern  . None   Social History Narrative   Registration notes: Red bag given 05/10/08.   Review of Systems: Constitutional: Denies fever, chills, diaphoresis, appetite change and fatigue.  HEENT: Positive for rhinorrhea. Denies photophobia, eye pain, redness, hearing loss, ear pain, congestion, sore throat, sneezing, mouth sores, trouble swallowing, neck pain, neck stiffness and tinnitus.   Respiratory: Denies SOB, DOE, cough, chest tightness,  and wheezing.   Cardiovascular: Denies chest pain, palpitations and leg swelling.  Gastrointestinal: Denies nausea, vomiting, abdominal pain, diarrhea, constipation, blood in stool and abdominal distention.  Genitourinary: Denies dysuria, urgency, frequency, hematuria, flank pain and difficulty urinating.  Musculoskeletal: Positive for back pain.  Denies  myalgias, joint swelling, arthralgias and gait problem.  Skin: Denies pallor, rash and wound.  Neurological: Denies dizziness, seizures, syncope, weakness, light-headedness, numbness and headaches.  Hematological: Denies adenopathy. Easy bruising, personal or family bleeding history  Psychiatric/Behavioral: Denies suicidal ideation, mood changes, confusion, nervousness, sleep disturbance and agitation  Objective:  Physical Exam: Filed Vitals:   02/26/11 1434  BP: 148/93  Pulse: 89  Temp: 98.6 F (37 C)  TempSrc: Oral  Height: 6\' 4"  (1.93 m)  Weight: 364 lb 9.6 oz (165.381 kg)   Constitutional: Vital signs reviewed.  Patient is a well-developed and well-nourished man in no acute distress and cooperative with exam. Alert and oriented x3. He appears mildly anxious throughout exam.  Head: Normocephalic and atraumatic Ear: TM normal bilaterally Mouth: no erythema or exudates, MMM Nose: Turbinate erythema and nasal polyps noted.  Eyes: PERRL, EOMI, conjunctivae mildly injected, No scleral icterus.  Neck: Supple, Trachea midline normal ROM, No JVD, mass, thyromegaly, or carotid bruit present.  Cardiovascular: RRR, S1 normal, S2 normal, no MRG, pulses symmetric and intact bilaterally Pulmonary/Chest: CTAB, no wheezes, rales, or rhonchi Abdominal: Soft. Non-tender, non-distended, bowel sounds are normal, no masses, organomegaly, or guarding present.  GU: no CVA tenderness Musculoskeletal: mild tenderness in the lumbar paraspinous muscles bilaterally.  No pain to palpation over the entire spine.  Mildly limited ROM in forward flexion, extension, and lateral bending.  No joint deformities, erythema, or stiffness, ROM full and no nontender Hematology: no cervical, inginal, or axillary adenopathy.  Neurological: A&O x3, Strength is normal and symmetric bilaterally, cranial nerve II-XII are grossly intact, no focal motor deficit, sensory intact to light touch bilaterally.  Skin: Warm, dry and  intact. No rash, cyanosis, or clubbing.  Psychiatric: mildly anxious mood and affect. Speech is not pressured and behavior is normal. Judgment and thought content normal. Cognition and memory are normal.   Assessment & Plan:

## 2011-02-26 NOTE — Patient Instructions (Addendum)
1.  Start the Losartan 25 mg tablets.  Take 1 tablet daily for your blood pressure.  2. Stop the Zyrtec,  Start Chlorphenermine 8 mg tablets take 1 tablet twice daily.  3.  Continue all of your other medication as directed.    4. Stop in the lab and get your blood drawn.  If we need to follow up on anything I will call you.  5. Follow up in 2 weeks to recheck your blood pressure.

## 2011-03-12 ENCOUNTER — Ambulatory Visit (INDEPENDENT_AMBULATORY_CARE_PROVIDER_SITE_OTHER): Payer: Medicare Other | Admitting: Internal Medicine

## 2011-03-12 ENCOUNTER — Encounter: Payer: Self-pay | Admitting: Internal Medicine

## 2011-03-12 DIAGNOSIS — R6884 Jaw pain: Secondary | ICD-10-CM

## 2011-03-12 DIAGNOSIS — I10 Essential (primary) hypertension: Secondary | ICD-10-CM

## 2011-03-12 NOTE — Patient Instructions (Signed)
Schedule followup appointment with your primary care provider in one to 2 months, or sooner if needed. If your jaw pain worsens or does not go away please make an appointment to be seen in the clinic. Continue to take her medications as directed.

## 2011-03-16 DIAGNOSIS — R6884 Jaw pain: Secondary | ICD-10-CM | POA: Insufficient documentation

## 2011-03-16 NOTE — Progress Notes (Signed)
Subjective:     Patient ID: Eduardo Duncan, male   DOB: [birth year], 51 y.o.   MRN: [id number]  HPI  Patient is a 51 year old man presenting today for followup of his blood pressure.  He has been taking losartan and tolerating this well with the exception of some left sided jaw pain.  He is unsure if the jaw pain is related to his new medication but noted it was present on the list of medication side effects.  He has been measuring his blood pressures at home and reports systolic blood pressures ranging in the 120s to 130s.  He has no other concerns or complaints today.  Review of Systems Review of Systems  Constitutional: Negative for fever, chills, diaphoresis, activity change, appetite change, fatigue and unexpected weight change.  HENT: Negative for hearing loss, congestion and neck stiffness.   Eyes: Negative for photophobia, pain and visual disturbance.  Respiratory: Negative for cough, chest tightness, shortness of breath and wheezing.   Cardiovascular: Negative for chest pain and palpitations.  Gastrointestinal: Negative for abdominal pain, blood in stool and anal bleeding.  Genitourinary: Negative for dysuria, hematuria and difficulty urinating.  Musculoskeletal: Negative for joint swelling.  Neurological: Negative for dizziness, syncope, speech difficulty, weakness, numbness and headaches.      Objective:   Physical Exam    VItal signs reviewed and stable. GEN: No apparent distress.  Alert and oriented x 3.  Pleasant, conversant, and cooperative to exam. HEENT: head is autraumatic and normocephalic.  Neck is supple without palpable masses or lymphadenopathy.  No JVD or carotid bruits.  Vision intact.  EOMI.  PERRLA.  Sclerae anicteric.  Conjunctivae without pallor or injection. Mucous membranes are moist.  Oropharynx is without erythema, exudates, or other abnormal lesions.  No erythema, pain, swollen of mandible.  No evidence of dental abscess. RESP:  Lungs are clear to  ascultation bilaterally with good air movement.  No wheezes, ronchi, or rubs. CARDIOVASCULAR: regular rate, normal rhythm.  Clear S1, S2, no murmurs, gallops, or rubs. ABDOMEN: soft, non-tender, non-distended.  Bowels sounds present in all quadrants and normoactive.  No palpable masses. EXT: warm and dry.  Peripheral pulses equal, intact, and +2 globally.  No clubbing or cyanosis.  Trace edema in bilateral lower extremities. SKIN: warm and dry with normal turgor.  No rashes or abnormal lesions observed. NEURO: CN II-XII grossly intact.  Muscle strength +5/5 in bilateral upper and lower extremities.  Sensation is grossly intact.  No focal deficit.  Assessment:

## 2011-03-16 NOTE — Assessment & Plan Note (Signed)
Patient's blood pressure is slightly above goal on arrival today, however his home blood pressure measurements have been within the target range.  So far he is tolerating the medication well.  He is advised to return to clinic or go to the emergency room if he develops worsening jaw pain, fever, chills, jaw swelling, or other concerning symptoms.  He declines blood work today; advised patient to consider repeat blood work at his next visitto assess his electrolytes and renal function on losartan.  For now, will continue his current medication regimen.

## 2011-03-16 NOTE — Assessment & Plan Note (Signed)
Patient reports 4 days of left-sided jaw discomfort. There are no abnormalities on exam to suggest dental abscess or other concerning process.  TMJ dysfunction is possible, although patient has no difficulty opening and closing his mouth.  He believes his symptoms may be the result of a dental cavity/tooth sensitivity and states has an upcoming appointment with a dentist.  Advised patient to return to clinic or to go to emergency room if he develops any jaw swelling, erythema, fevers, chills, difficulty swallowing, difficulty eating, or other concerning symptom.

## 2011-04-20 DIAGNOSIS — M545 Low back pain, unspecified: Secondary | ICD-10-CM | POA: Insufficient documentation

## 2011-04-20 NOTE — Assessment & Plan Note (Signed)
Lab Results  Component Value Date   NA 139 02/26/2011   K 4.5 02/26/2011   CL 108 02/26/2011   CO2 26 02/26/2011   BUN 12 02/26/2011   CREATININE 1.02 02/26/2011   CREATININE 1.14 07/28/2009    BP Readings from Last 3 Encounters:  02/26/11 148/93  12/23/10 138/82    Assessment: Hypertension control:  mildly elevated  Progress toward goals:  unchanged Barriers to meeting goals:  nonadherence to medications and lack of understanding of disease management  Plan: Hypertension treatment:  He has not been taking his Losartan.  We discussed the possible side effects and he felt that he would be willing to start the medication after discussing it with me.  We will have him come back in a few weeks to recheck his blood pressure and possibly titrate up if needed.

## 2011-04-20 NOTE — Assessment & Plan Note (Signed)
His lower back symptoms appear to be secondary to musculoskeletal back pain that was likely from the physical activity of moving to his new apartment.  We will have him use tylenol, ice, or heat as necessary as well as working on stretching.  He was reassured that this would improve over the next few days.

## 2011-04-20 NOTE — Assessment & Plan Note (Signed)
He continues to have post nasal drip that is causing SOB and cough still.  We will have him stop the Zyrtec and try chlorpheniramine to see if this helps him more.  We will assess the response at his follow up appointment.

## 2011-05-14 ENCOUNTER — Ambulatory Visit (INDEPENDENT_AMBULATORY_CARE_PROVIDER_SITE_OTHER): Payer: Medicare Other | Admitting: Internal Medicine

## 2011-05-14 ENCOUNTER — Encounter: Payer: Self-pay | Admitting: Internal Medicine

## 2011-05-14 VITALS — BP 110/80 | HR 76 | Temp 97.8°F | Ht 76.0 in | Wt 349.0 lb

## 2011-05-14 DIAGNOSIS — F319 Bipolar disorder, unspecified: Secondary | ICD-10-CM

## 2011-05-14 DIAGNOSIS — I10 Essential (primary) hypertension: Secondary | ICD-10-CM

## 2011-05-14 DIAGNOSIS — E785 Hyperlipidemia, unspecified: Secondary | ICD-10-CM

## 2011-05-14 LAB — LIPID PANEL
LDL Cholesterol: 70 mg/dL (ref 0–99)
VLDL: 20 mg/dL (ref 0–40)

## 2011-05-14 LAB — BASIC METABOLIC PANEL
BUN: 13 mg/dL (ref 6–23)
Calcium: 9.2 mg/dL (ref 8.4–10.5)
Chloride: 106 mEq/L (ref 96–112)
Potassium: 4.1 mEq/L (ref 3.5–5.3)
Sodium: 141 mEq/L (ref 135–145)

## 2011-05-14 MED ORDER — SIMVASTATIN 40 MG PO TABS: 40.0000 mg | ORAL_TABLET | Freq: Every day | ORAL | Status: DC

## 2011-05-14 NOTE — Progress Notes (Signed)
Subjective:   Patient ID: Eduardo Duncan male   DOB: 02-21-1960 51 y.o.   MRN: [id number]  HPI: Eduardo Duncan is a 51 y.o. man who presents to clinic today for follow up on his chronic medical conditions including hypertension, hyperlipidemia, and his mental health.    He states that he has been having problems with panic attack recently.  He states that he has periods of hyperawareness, paranoia, and times where his mind is racing.  He states that his sleep has been bad lately as well.  He states when these attacks happen he has to stop what he is doing and sit and work on his breathing and try to "settle myself down."  He denies any SI, HI, chest pain, hallucinations, out of control spending or other manic symptoms or self harm.  He has not taken anything to help these attacks and does not like most of the medications that would be used for these.  "I've been on most of them and they turn me into a zombie."  He has not followed up with Mental Health but feels like he needs to.  He also states that he has had problems with a cracked tooth.  He states that this has been going on for several weeks and that he has not had time to get to a dentist.  He has been taking tylenol and ibuprofen for the pain but they don't help much and he requests something stronger. He denies fevers, chills, headaches, jaw claudication, or bleeding from the area.  He states that otherwise he has been taking his medications as directed for his blood pressure and cholesterol.  He denies any side effects from the medications and states that when he checks his blood pressure that it has been well controlled with SBP <130.  He denies any dizziness on standing, headaches, or chest pain.    Past Medical History  Diagnosis Date  . Allergy   . Dizziness   . Osteoporosis   . Hypertension   . Depression   . GERD (gastroesophageal reflux disease)   . Hyperlipidemia   . Polycythemia rubra vera   . Obstructive sleep apnea     . Spinal stenosis of lumbar region    Current Outpatient Prescriptions  Medication Sig Dispense Refill  . albuterol (PROVENTIL,VENTOLIN) 90 MCG/ACT inhaler Inhale 2 puffs into the lungs every 6 (six) hours as needed. For shortness of breath  17 g  3  . aspirin (ASPIRIN ADULT LOW STRENGTH) 81 MG EC tablet Take 81 mg by mouth daily.        . carvedilol (COREG) 12.5 MG tablet Take 1 tablet (12.5 mg total) by mouth 2 (two) times daily with a meal.  60 tablet  11  . losartan (COZAAR) 25 MG tablet Take 1 tablet (25 mg total) by mouth daily.  30 tablet  11  . simvastatin (ZOCOR) 40 MG tablet Take 1 tablet (40 mg total) by mouth at bedtime.  30 tablet  6  . traMADol (ULTRAM) 50 MG tablet Take 50 mg by mouth 3 (three) times daily as needed.         Current Facility-Administered Medications  Medication Dose Route Frequency Provider Last Rate Last Dose  . pneumococcal 23 valent vaccine (PNU-IMMUNE) injection 0.5 mL  0.5 mL Intramuscular Once Leodis Sias, MD       Family History  Problem Relation Age of Onset  . Diabetes Mother   . Hypertension Mother   . Alzheimer's disease Mother   .  Heart disease Mother   . Heart disease Father    History   Social History  . Marital Status: Single    Spouse Name: N/A    Number of Children: N/A  . Years of Education: N/A   Occupational History  . disability      10/2006--has not worked since 2004   Social History Main Topics  . Smoking status: Former Smoker    Types: Cigarettes    Quit date: 03/13/1986  . Smokeless tobacco: None  . Alcohol Use: No     never--used to drink heavily  . Drug Use: No  . Sexually Active: None   Other Topics Concern  . None   Social History Narrative   Registration notes: Red bag given 05/10/08.   Review of Systems: Constitutional: Denies fever, chills, diaphoresis, appetite change and fatigue.  HEENT: Denies photophobia, eye pain, redness, hearing loss, ear pain, congestion, sore throat, rhinorrhea,  sneezing, mouth sores, trouble swallowing, neck pain, neck stiffness and tinnitus.   Respiratory: Denies SOB, DOE, cough, chest tightness,  and wheezing.   Cardiovascular: Denies chest pain, palpitations and leg swelling.  Gastrointestinal: Denies nausea, vomiting, abdominal pain, diarrhea, constipation, blood in stool and abdominal distention.  Genitourinary: Denies dysuria, urgency, frequency, hematuria, flank pain and difficulty urinating.  Musculoskeletal: Denies myalgias, back pain, joint swelling, arthralgias and gait problem.  Skin: Denies pallor, rash and wound.  Neurological: Denies dizziness, seizures, syncope, weakness, light-headedness, numbness and headaches.  Hematological: Denies adenopathy. Easy bruising, personal or family bleeding history  Psychiatric/Behavioral: Positive for nervousness and sleep disturbance.  Denies suicidal ideation, mood changes, confusion, and agitation  Objective:  Physical Exam: Filed Vitals:   05/14/11 1358  BP: 110/80  Pulse: 76  Temp: 97.8 F (36.6 C)  TempSrc: Oral  Height: 6\' 4"  (1.93 m)  Weight: 349 lb (158.305 kg)  SpO2: 96%   Constitutional: Vital signs reviewed.  Patient is a well-developed and well-nourished man in no acute distress and cooperative with exam. Alert and oriented x3.  He appears mildly anxious on exam today.  Head: Normocephalic and atraumatic Ear: TM normal bilaterally Mouth: no erythema or exudates, MMM Eyes: PERRL, EOMI, conjunctivae normal, No scleral icterus.  Neck: Supple, Trachea midline normal ROM, No JVD, mass, thyromegaly, or carotid bruit present.  Cardiovascular: RRR, S1 normal, S2 normal, no MRG, pulses symmetric and intact bilaterally Pulmonary/Chest: CTAB, no wheezes, rales, or rhonchi Abdominal: Soft. Non-tender, non-distended, bowel sounds are normal, no masses, organomegaly, or guarding present.  GU: no CVA tenderness Musculoskeletal: No joint deformities, erythema, or stiffness, ROM full and no  nontender Hematology: no cervical, inginal, or axillary adenopathy.  Neurological: A&O x3, Strength is normal and symmetric bilaterally, cranial nerve II-XII are grossly intact, no focal motor deficit, sensory intact to light touch bilaterally.  Skin: Warm, dry and intact. No rash, cyanosis, or clubbing.  Psychiatric: anxious mood and affect. speech is mildly rushed but not pressured. Behavior is normal. Judgment and insight intact.  Thought content normal. Cognition and memory are normal.   Assessment & Plan:

## 2011-05-14 NOTE — Patient Instructions (Addendum)
1.  Continue taking your medications as prescribed.  2.  Make time to go over to [facility] to see if you can be seen to help you with your anxiety  3.  Take the Tramadol to help you with the tooth pain  4.  Follow up with me in about 3 months.

## 2011-05-17 ENCOUNTER — Telehealth: Payer: Self-pay | Admitting: Licensed Clinical Social Worker

## 2011-05-17 NOTE — Telephone Encounter (Signed)
Eduardo Duncan is a 51 y.o. male who has been referred to CSW for mental health resources in the [facility] area.  Pt has [employer] Medicare and Medicaid.  CSW utilized [facility] website to find [employer] health providers: [facility] [phone], [phone], [facility] [phone] and [facility] [phone].  CSW left message requesting return call, provided number and hours.

## 2011-05-18 NOTE — Telephone Encounter (Signed)
CSW placed called to pt.  CSW left message requesting return call. CSW provided contact hours and phone number. 

## 2011-05-18 NOTE — Telephone Encounter (Signed)
Eduardo Duncan returned call to CSW and informed CSW that he is now eligible for medicaid as well.  CSW provided pt the facility/therapist in [facility] area and informed Eduardo Duncan these agencies take both [employer] and Medicaid.  If currently receiving medicaid benefits, most likely would pick up any co-pay amount.  Per request as pt was driving, CSW call back and left message on voicemail with referral information to [facility], [facility] and [facility] in [place].  Phone numbers listed in prior note.

## 2011-06-14 ENCOUNTER — Encounter: Payer: Self-pay | Admitting: Physical Medicine & Rehabilitation

## 2011-06-14 ENCOUNTER — Encounter: Payer: Medicare Other | Attending: Physical Medicine & Rehabilitation

## 2011-06-14 ENCOUNTER — Ambulatory Visit (HOSPITAL_BASED_OUTPATIENT_CLINIC_OR_DEPARTMENT_OTHER): Payer: Medicare Other | Admitting: Physical Medicine & Rehabilitation

## 2011-06-14 VITALS — BP 138/76 | HR 89 | Ht 74.0 in | Wt 342.0 lb

## 2011-06-14 DIAGNOSIS — M48061 Spinal stenosis, lumbar region without neurogenic claudication: Secondary | ICD-10-CM

## 2011-06-14 DIAGNOSIS — M961 Postlaminectomy syndrome, not elsewhere classified: Secondary | ICD-10-CM | POA: Insufficient documentation

## 2011-06-14 DIAGNOSIS — M47814 Spondylosis without myelopathy or radiculopathy, thoracic region: Secondary | ICD-10-CM

## 2011-06-14 DIAGNOSIS — IMO0002 Reserved for concepts with insufficient information to code with codable children: Secondary | ICD-10-CM | POA: Insufficient documentation

## 2011-06-14 DIAGNOSIS — M542 Cervicalgia: Secondary | ICD-10-CM

## 2011-06-14 MED ORDER — TRAMADOL HCL 50 MG PO TABS: 50.0000 mg | ORAL_TABLET | Freq: Three times a day (TID) | ORAL | Status: DC | PRN

## 2011-06-14 NOTE — Patient Instructions (Signed)
Next visit will be with my physician assistant in 6 months. She has been a physical therapist as well. You can ask her about exercise questions

## 2011-06-14 NOTE — Progress Notes (Signed)
Subjective:    Patient ID: Eduardo Duncan, male    DOB: Nov 20, 1960, 51 y.o.   MRN: [id number]  HPI Issue with multiple questions regarding alternative treatments including chiropractic, acupuncture, lumbar decompression, minimally invasive treatments. We discussed the pros and cons of each of these including insurance payment. 80 pound weight loss through diet and exercise Pain Inventory Average Pain 5 Pain Right Now 5 My pain is constant, burning, tingling and aching  In the last 24 hours, has pain interfered with the following? General activity 2 Relation with others 2 Enjoyment of life 2 What TIME of day is your pain at its worst? varies Sleep (in general) Fair  Pain is worse with: walking, bending and standing Pain improves with: heat/ice and medication Relief from Meds: 8  Mobility walk without assistance how many minutes can you walk? 15 ability to climb steps?  yes do you drive?  yes Do you have any goals in this area?  yes  Function disabled: date disabled  Do you have any goals in this area?  yes  Neuro/Psych numbness tingling  Prior Studies Any changes since last visit?  no  Physicians involved in your care Any changes since last visit?  no   Family History  Problem Relation Age of Onset  . Diabetes Mother   . Hypertension Mother   . Alzheimer's disease Mother   . Heart disease Mother   . Heart disease Father    History   Social History  . Marital Status: Single    Spouse Name: N/A    Number of Children: N/A  . Years of Education: N/A   Occupational History  . disability      10/2006--has not worked since 2004   Social History Main Topics  . Smoking status: Former Smoker    Types: Cigarettes    Quit date: 03/13/1986  . Smokeless tobacco: None  . Alcohol Use: No     never--used to drink heavily  . Drug Use: No  . Sexually Active: None   Other Topics Concern  . None   Social History Narrative   Registration notes: Red bag given  05/10/08.   Past Surgical History  Procedure Date  . Cholecystectomy 07/2009    Lap  . Colonoscopy 01/2009   Past Medical History  Diagnosis Date  . Allergy   . Dizziness   . Osteoporosis   . Hypertension   . Depression   . GERD (gastroesophageal reflux disease)   . Hyperlipidemia   . Polycythemia rubra vera   . Obstructive sleep apnea   . Spinal stenosis of lumbar region    BP 138/76  Pulse 89  Ht 6\' 2"  (1.88 m)  Wt 342 lb (155.13 kg)  BMI 43.91 kg/m2  SpO2 97%     Review of Systems  Respiratory: Positive for apnea.   Neurological: Positive for numbness.  All other systems reviewed and are negative.       Objective:   Physical Exam  Constitutional: He is oriented to person, place, and time. He appears well-developed.       obese  Musculoskeletal:       Cervical back: He exhibits decreased range of motion.       Thoracic back: He exhibits decreased range of motion and tenderness. He exhibits no spasm.       Lumbar back: He exhibits decreased range of motion. He exhibits no tenderness.       Reduced thoracic range of motion as well as mild tenderness  to palpation C7 paraspinal muscle area  Neurological: He is alert and oriented to person, place, and time. He has normal strength. He displays no atrophy. No sensory deficit. Gait normal.  Reflex Scores:      Tricep reflexes are 2+ on the right side and 2+ on the left side.      Bicep reflexes are 2+ on the right side and 2+ on the left side.      Brachioradialis reflexes are 2+ on the right side and 2+ on the left side.      Patellar reflexes are 2+ on the right side and 2+ on the left side.      Achilles reflexes are 2+ on the right side and 2+ on the left side. Psychiatric: He has a normal mood and affect.          Assessment & Plan:  1. Lumbar spinal stenosis last MRI showed no progression in fact some improvement compared to previous 2. Thoracic spondylosis with thoracic degenerative disc T9-T10 no sign of  myelopathy 3. Cervical postlaminectomy syndrome. Continue current medications which is basically tramadol 3 times a day Encourage patient's continued weight loss Encourage patient continued to light resistive exercise program

## 2011-06-22 ENCOUNTER — Ambulatory Visit: Payer: Medicare Other | Admitting: Physical Medicine & Rehabilitation

## 2011-07-12 NOTE — Assessment & Plan Note (Signed)
Lab Results  Component Value Date   NA 141 05/14/2011   K 4.1 05/14/2011   CL 106 05/14/2011   CO2 25 05/14/2011   BUN 13 05/14/2011   CREATININE 1.08 05/14/2011   CREATININE 1.14 07/28/2009    BP Readings from Last 3 Encounters:  06/14/11 138/76  05/14/11 110/80  03/12/11 145/84    Assessment: Hypertension control:  controlled  Progress toward goals:  at goal Barriers to meeting goals:  no barriers identified  Plan: Hypertension treatment:  continue current medications

## 2011-07-12 NOTE — Assessment & Plan Note (Signed)
Lab Results  Component Value Date   CHOL 125 05/14/2011   CHOL 159 12/09/2010   CHOL 127 05/13/2009   Lab Results  Component Value Date   HDL 35* 05/14/2011   HDL 40 45/40/9811   HDL 35* 05/13/2009   Lab Results  Component Value Date   LDLCALC 70 05/14/2011   LDLCALC 90 12/09/2010   LDLCALC 68 05/13/2009   Lab Results  Component Value Date   TRIG 98 05/14/2011   TRIG 145 12/09/2010   TRIG 119 05/13/2009   Lab Results  Component Value Date   CHOLHDL 3.6 05/14/2011   CHOLHDL 4.0 12/09/2010   CHOLHDL 3.6 Ratio 05/13/2009   No results found for this basename: LDLDIRECT   His LDL cholesterol is well below his goal of <130.  We will continue to monitor him on a yearly basis.

## 2011-07-12 NOTE — Assessment & Plan Note (Signed)
He states that he is having panic attacks and periods of paranoia but states that he is able to control them with breathing and relaxation exercises.  He has not followed up with mental health.  Since he has insurance we will work to try to get him set up with a psychiatrist and possibly counseling.  I have asked social work to help me with finding someone covered by his insurance.

## 2011-08-20 ENCOUNTER — Telehealth: Payer: Self-pay | Admitting: *Deleted

## 2011-08-20 NOTE — Telephone Encounter (Signed)
Pt calls stating he may have a hernia and needs to f/u for asthma. appt for wed 8/14 dr Dorise Hiss per chilonb.

## 2011-08-25 ENCOUNTER — Ambulatory Visit (INDEPENDENT_AMBULATORY_CARE_PROVIDER_SITE_OTHER): Payer: Medicare Other | Admitting: Internal Medicine

## 2011-08-25 ENCOUNTER — Encounter: Payer: Self-pay | Admitting: Internal Medicine

## 2011-08-25 VITALS — BP 124/80 | HR 74 | Temp 97.4°F | Ht 76.0 in | Wt 343.8 lb

## 2011-08-25 DIAGNOSIS — R1900 Intra-abdominal and pelvic swelling, mass and lump, unspecified site: Secondary | ICD-10-CM

## 2011-08-25 DIAGNOSIS — J45909 Unspecified asthma, uncomplicated: Secondary | ICD-10-CM

## 2011-08-25 DIAGNOSIS — E669 Obesity, unspecified: Secondary | ICD-10-CM

## 2011-08-25 MED ORDER — CETIRIZINE HCL 10 MG PO CHEW: 10.0000 mg | CHEWABLE_TABLET | Freq: Every day | ORAL | Status: AC

## 2011-08-25 NOTE — Patient Instructions (Signed)
You were seen today for your breathing and we would like you to continue taking albuterol before exercise. We will give you a medicine for allergy called zyrtec (cetirizine) which you can take once a day for allergies and may help with your breathing. If you have any questions or problems please call us. Our number is [phone].  Asthma Prevention Cigarette smoke, house dust, molds, pollens, animal dander, certain insects, exercise, and even cold air are all triggers that can cause an asthma attack. Often, no specific triggers are identified.  Take the following measures around your house to reduce attacks:  Avoid cigarette and other smoke. No smoking should be allowed in a home where someone with asthma lives. If smoking is allowed indoors, it should be done in a room with a closed door, and a window should be opened to clear the air. If possible, do not use a wood-burning stove, kerosene heater, or fireplace. Minimize exposure to all sources of smoke, including incense, candles, fires, and fireworks.   Decrease pollen exposure. Keep your windows shut and use central air during the pollen allergy season. Stay indoors with windows closed from late morning to afternoon, if you can. Avoid mowing the lawn if you have grass pollen allergy. Change your clothes and shower after being outside during this time of year.   Remove molds from bathrooms and wet areas. Do this by cleaning the floors with a fungicide or diluted bleach. Avoid using humidifiers, vaporizers, or swamp coolers. These can spread molds through the air. Fix leaky faucets, pipes, or other sources of water that have mold around them.   Decrease house dust exposure. Do this by using bare floors, vacuuming frequently, and changing furnace and air cooler filters frequently. Avoid using feather, wool, or foam bedding. Use polyester pillows and plastic covers over your mattress. Wash bedding weekly in hot water (hotter than 130 F).   Try to get  someone else to vacuum for you once or twice a week, if you can. Stay out of rooms while they are being vacuumed and for a short while afterward. If you vacuum, use a dust mask (from a hardware store), a double-layered or microfilter vacuum cleaner bag, or a vacuum cleaner with a HEPA filter.   Avoid perfumes, talcum powder, hair spray, paints and other strong odors and fumes.   Keep warm-blooded pets (cats, dogs, rodents, birds) outside the home if they are triggers for asthma. If you can't keep the pet outdoors, keep the pet out of your bedroom and other sleeping areas at all times, and keep the door closed. Remove carpets and furniture covered with cloth from your home. If that is not possible, keep the pet away from fabric-covered furniture and carpets.   Eliminate cockroaches. Keep food and garbage in closed containers. Never leave food out. Use poison baits, traps, powders, gels, or paste (for example, boric acid). If a spray is used to kill cockroaches, stay out of the room until the odor goes away.   Decrease indoor humidity to less than 60%. Use an indoor air cleaning device.   Avoid sulfites in foods and beverages. Do not drink beer or wine or eat dried fruit, processed potatoes, or shrimp if they cause asthma symptoms.   Avoid cold air. Cover your nose and mouth with a scarf on cold or windy days.   Avoid aspirin. This is the most common drug causing serious asthma attacks.   If exercise triggers your asthma, ask your caregiver how you should prepare  before exercising. (For example, ask if you could use your inhaler 10 minutes before exercising.)   Avoid close contact with people who have a cold or the flu since your asthma symptoms may get worse if you catch the infection from them. Wash your hands thoroughly after touching items that may have been handled by others with a respiratory infection.   Get a flu shot every year to protect against the flu virus, which often makes asthma  worse for days to weeks. Also get a pneumonia shot once every five to 10 years.  Call your caregiver if you want further information about measures you can take to help prevent asthma attacks. Document Released: 12/28/2004 Document Revised: 12/17/2010 Document Reviewed: 11/05/2008 [facility] Patient Information 2012 [place], [place].

## 2011-08-25 NOTE — Progress Notes (Signed)
Subjective:     Patient ID: Eduardo Duncan, male   DOB: Jun 29, 1960, 51 y.o.   MRN: [id number]  HPI The patient is a 51 year old male comes in today for shortness of breath and a bulge on his abdomen that he is concerned may be hernia. He states he has had asthma for a good long time and he is currently controlled on only albuterol. He states that he sometimes has SOB when he is exercising. He states that he's only had to use albuterol 10 times in the past 10-12 weeks. He states that with the albuterol the SOB goes right away. He doesn't notice it worse at any time of day particularly. Only exacerbation noted is the exercise. He feels as though maybe his allergies have been a little bad recently and has been able unable to afford allergy medication. This could be account for the breathing changes. He continues to work out and do exercise although is limited in the amount he can do based on his back injury. He does continue to follow with pain management for management of his back pain. The bulge on his abdomen is something he's noticed since he's been losing weight. It hasn't grown in size. It doesn't bulge out ever when he is working out. No chest pain, N/V/D.  Review of Systems  Constitutional: Negative for fever, chills, activity change, appetite change and fatigue.  HENT: Positive for rhinorrhea and sinus pressure. Negative for nosebleeds, congestion, sore throat, facial swelling, sneezing, drooling, mouth sores, trouble swallowing, neck pain, neck stiffness, dental problem, voice change, postnasal drip and ear discharge.   Eyes: Negative for photophobia, pain, discharge, redness, itching and visual disturbance.  Respiratory: Negative for cough, choking, chest tightness, shortness of breath and wheezing.   Cardiovascular: Negative.  Negative for chest pain, palpitations and leg swelling.  Gastrointestinal: Negative.   Musculoskeletal: Negative.   Skin: Negative.   Neurological: Negative.       Objective:   Physical Exam  Constitutional: He is oriented to person, place, and time. He appears well-developed and well-nourished.       Obese   HENT:  Head: Normocephalic and atraumatic.  Eyes: EOM are normal. Pupils are equal, round, and reactive to light.  Neck: Neck supple.  Cardiovascular: Normal rate and regular rhythm.   Pulmonary/Chest: Effort normal and breath sounds normal. No respiratory distress. He has no wheezes. He exhibits no tenderness.  Abdominal: Soft. Bowel sounds are normal. He exhibits no distension and no mass. There is no tenderness. There is no rebound and no guarding.       Pt does have fat rolls and one small area of white skin on his right lower quadrant that could be fat deposition. Does not bulge on straining or increase in size.   Musculoskeletal: Normal range of motion. He exhibits tenderness. He exhibits no edema.  Neurological: He is alert and oriented to person, place, and time.  Skin: Skin is warm and dry.       Assessment/Plan:   1. Asthma-the patient will be given prescription for Zyrtec to be taken daily to help with his breathing. He states "he has been tried on many medications for his breathing but all give him extreme paranoia to the point of having barricaded himself in his home". We will not retrial any of his medications at today's visit. He does see Dr. Octavio Graves of pulmonology and advised him to continue to follow with them. Otherwise advised continued use of albuterol 10 minutes before exercise and  as needed when necessary.  2. Abdominal bulge-the patient does have a small area of possibly fatty deposition on his abdomen. He does have several rolls of skin and fat that protrude. This is located on the right lower quadrant. It does not bulge or feel like a hernia.

## 2011-08-31 ENCOUNTER — Telehealth: Payer: Self-pay | Admitting: *Deleted

## 2011-08-31 NOTE — Telephone Encounter (Signed)
Pt calls and states script for zyrtec is very expensive, his sister can get 10mg  generic otc for $7.03 for #100, may she do this for him?

## 2011-08-31 NOTE — Telephone Encounter (Signed)
Yeah, that is fine. 1 tab daily of the 10 mg tabs.

## 2011-12-31 ENCOUNTER — Other Ambulatory Visit: Payer: Self-pay | Admitting: Internal Medicine

## 2012-01-27 ENCOUNTER — Other Ambulatory Visit: Payer: Self-pay | Admitting: Internal Medicine

## 2012-02-14 ENCOUNTER — Encounter: Payer: Self-pay | Admitting: Physical Medicine & Rehabilitation

## 2012-02-14 ENCOUNTER — Encounter: Payer: Medicare Other | Attending: Physical Medicine & Rehabilitation

## 2012-02-14 ENCOUNTER — Ambulatory Visit (HOSPITAL_BASED_OUTPATIENT_CLINIC_OR_DEPARTMENT_OTHER): Payer: Medicare Other | Admitting: Physical Medicine & Rehabilitation

## 2012-02-14 VITALS — BP 143/77 | HR 59 | Resp 14 | Ht 76.0 in | Wt 330.2 lb

## 2012-02-14 DIAGNOSIS — IMO0002 Reserved for concepts with insufficient information to code with codable children: Secondary | ICD-10-CM | POA: Insufficient documentation

## 2012-02-14 DIAGNOSIS — M47814 Spondylosis without myelopathy or radiculopathy, thoracic region: Secondary | ICD-10-CM | POA: Insufficient documentation

## 2012-02-14 DIAGNOSIS — M961 Postlaminectomy syndrome, not elsewhere classified: Secondary | ICD-10-CM

## 2012-02-14 DIAGNOSIS — M48061 Spinal stenosis, lumbar region without neurogenic claudication: Secondary | ICD-10-CM | POA: Insufficient documentation

## 2012-02-14 NOTE — Patient Instructions (Signed)
Take tylenol and ibuprofen on an as needed basis Recommend cont working out

## 2012-02-14 NOTE — Progress Notes (Signed)
Subjective:    Patient ID: Eduardo Duncan, male    DOB: March 06, 1960, 52 y.o.   MRN: [id number]  HPI  80 pound weight loss through diet and exercise Cybex and Hammer string Working out at gym 6 days per week Low impact aerobics Pain Inventory Average Pain 5 Pain Right Now 6 My pain is stabbing and aching  In the last 24 hours, has pain interfered with the following? General activity 4 Relation with others 4 Enjoyment of life 4 What TIME of day is your pain at its worst? varies Sleep (in general) Fair  Pain is worse with: bending and standing Pain improves with: rest, heat/ice and medication Relief from Meds: 6  Mobility walk without assistance how many minutes can you walk? 10 ability to climb steps?  yes do you drive?  yes  Function disabled: date disabled 2003  Neuro/Psych numbness tingling  Prior Studies Any changes since last visit?  no  Physicians involved in your care Any changes since last visit?  no   Family History  Problem Relation Age of Onset  . Diabetes Mother   . Hypertension Mother   . Alzheimer's disease Mother   . Heart disease Mother   . Heart disease Father    History   Social History  . Marital Status: Single    Spouse Name: N/A    Number of Children: N/A  . Years of Education: N/A   Occupational History  . disability      10/2006--has not worked since 2004   Social History Main Topics  . Smoking status: Former Smoker    Types: Cigarettes    Quit date: 03/13/1986  . Smokeless tobacco: Never Used  . Alcohol Use: No     Comment: never--used to drink heavily  . Drug Use: No  . Sexually Active: None   Other Topics Concern  . None   Social History Narrative   Registration notes: Red bag given 05/10/08.   Past Surgical History  Procedure Date  . Cholecystectomy 07/2009    Lap  . Colonoscopy 01/2009   Past Medical History  Diagnosis Date  . Allergy   . Dizziness   . Osteoporosis   . Hypertension   . Depression    . GERD (gastroesophageal reflux disease)   . Hyperlipidemia   . Polycythemia rubra vera   . Obstructive sleep apnea   . Spinal stenosis of lumbar region    BP 143/77  Pulse 59  Resp 14  Ht 6\' 4"  (1.93 m)  Wt 330 lb 3.2 oz (149.778 kg)  BMI 40.19 kg/m2  SpO2 96%   Review of Systems  Neurological: Positive for numbness.       Tingling   All other systems reviewed and are negative.       Objective:   Physical Exam  Nursing note and vitals reviewed. Constitutional: He is oriented to person, place, and time. He appears well-developed.       obese  HENT:  Head: Normocephalic and atraumatic.  Eyes: Conjunctivae normal and EOM are normal. Pupils are equal, round, and reactive to light.  Neck: Normal range of motion. Neck supple.  Musculoskeletal:       Right hip: Normal.       Left hip: Normal.       Lumbar back: He exhibits decreased range of motion and pain. He exhibits no tenderness, no bony tenderness, no deformity and no spasm.       Negative straight leg raising test Pain at  end range extension of the lumbar spine  Neurological: He is alert and oriented to person, place, and time. He has normal reflexes.  Psychiatric: He has a normal mood and affect.          Assessment & Plan:  1. Lumbar spinal stenosis last MRI showed no progression in fact some improvement compared to previous  2. Thoracic spondylosis with thoracic degenerative disc T9-T10 no sign of myelopathy  3. Cervical postlaminectomy syndrome.  Continue current medications which is basically tramadol 3 times a day  Encourage patient's continued weight loss  Encourage patient continued to light resistive exercise program  Patient had numerous questions regarding supplements, exercise, diet Over half of the 25 min visit was spent counseling and coordinating care.

## 2012-02-28 ENCOUNTER — Other Ambulatory Visit: Payer: Self-pay | Admitting: Internal Medicine

## 2012-05-12 ENCOUNTER — Encounter: Payer: Self-pay | Admitting: Internal Medicine

## 2012-05-12 ENCOUNTER — Ambulatory Visit (INDEPENDENT_AMBULATORY_CARE_PROVIDER_SITE_OTHER): Payer: Medicare Other | Admitting: Internal Medicine

## 2012-05-12 VITALS — BP 137/87 | HR 68 | Temp 97.2°F | Wt 325.1 lb

## 2012-05-12 DIAGNOSIS — I10 Essential (primary) hypertension: Secondary | ICD-10-CM

## 2012-05-12 DIAGNOSIS — E785 Hyperlipidemia, unspecified: Secondary | ICD-10-CM

## 2012-05-12 DIAGNOSIS — K219 Gastro-esophageal reflux disease without esophagitis: Secondary | ICD-10-CM

## 2012-05-12 DIAGNOSIS — J309 Allergic rhinitis, unspecified: Secondary | ICD-10-CM

## 2012-05-12 DIAGNOSIS — N393 Stress incontinence (female) (male): Secondary | ICD-10-CM

## 2012-05-12 LAB — LIPID PANEL
HDL: 35 mg/dL — ABNORMAL LOW (ref >39)
Triglycerides: 117 mg/dL (ref <150)

## 2012-05-12 LAB — COMPREHENSIVE METABOLIC PANEL
AST: 26 U/L (ref 0–37)
Albumin: 3.9 g/dL (ref 3.5–5.2)
BUN: 7 mg/dL (ref 6–23)
Calcium: 9.4 mg/dL (ref 8.4–10.5)
Chloride: 104 mEq/L (ref 96–112)
Glucose, Bld: 86 mg/dL (ref 70–99)
Potassium: 4.3 mEq/L (ref 3.5–5.3)

## 2012-05-12 MED ORDER — FAMOTIDINE 20 MG PO TABS: 20.0000 mg | ORAL_TABLET | Freq: Two times a day (BID) | ORAL | Status: DC

## 2012-05-12 MED ORDER — CARVEDILOL 12.5 MG PO TABS: 12.5000 mg | ORAL_TABLET | Freq: Two times a day (BID) | ORAL | Status: DC

## 2012-05-12 MED ORDER — SIMVASTATIN 40 MG PO TABS: 40.0000 mg | ORAL_TABLET | Freq: Every day | ORAL | Status: DC

## 2012-05-12 MED ORDER — LOSARTAN POTASSIUM 25 MG PO TABS: 25.0000 mg | ORAL_TABLET | Freq: Every day | ORAL | Status: DC

## 2012-05-12 NOTE — Progress Notes (Signed)
Subjective:   Patient ID: Eduardo Duncan male   DOB: May 12, 1960 52 y.o.   MRN: [id number]  HPI: Eduardo Duncan is a 52 y.o. man who presents to clinic today for follow up on his chronic medical problems including hypertension, hyperlipidemia, GERD, and allergic rhinitis.  See Problem focused Assessment and Plan for full details of his chronic medical conditions.    He also states that he has been having problems with urine leakage.  He states that he has been lifting weights again.  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  He has gotten up to 1000 reps per day.  He notices when he is lifting he will leak a small amount of urine.  He has noted a little problem with dribbling after he urinates but that has not changed recently.  He denies dysuria, increased urinary frequency, numbness, leg weakness, tingling, or bowel incontinence.  He has denies weak stream, family history of prostate cancer, or blood in his urine.    Coughing right away in the morning.  Some congestion in the morning.  Some post nasal drip.    Past Medical History  Diagnosis Date  . Allergy   . Dizziness   . Osteoporosis   . Hypertension   . Depression   . GERD (gastroesophageal reflux disease)   . Hyperlipidemia   . Polycythemia rubra vera   . Obstructive sleep apnea   . Spinal stenosis of lumbar region    Current Outpatient Prescriptions  Medication Sig Dispense Refill  . albuterol (PROVENTIL,VENTOLIN) 90 MCG/ACT inhaler Inhale 2 puffs into the lungs every 6 (six) hours as needed. For shortness of breath  17 g  3  . aspirin (ASPIRIN ADULT LOW STRENGTH) 81 MG EC tablet Take 81 mg by mouth daily.        . carvedilol (COREG) 12.5 MG tablet Take 1 tablet (12.5 mg total) by mouth 2 (two) times daily with a meal.  60 tablet  6  . cetirizine (ZYRTEC) 10 MG chewable tablet Chew 1 tablet (10 mg total) by mouth daily.  30 tablet  3  . losartan (COZAAR)  25 MG tablet Take 1 tablet (25 mg total) by mouth daily.  30 tablet  6  . simvastatin (ZOCOR) 40 MG tablet Take 1 tablet (40 mg total) by mouth at bedtime.  30 tablet  5  . traMADol (ULTRAM) 50 MG tablet Take 1 tablet (50 mg total) by mouth 3 (three) times daily as needed.  90 tablet  5   No current facility-administered medications for this visit.   Family History  Problem Relation Age of Onset  . Diabetes Mother   . Hypertension Mother   . Alzheimer's disease Mother   . Heart disease Mother   . Heart disease Father    History   Social History  . Marital Status: Single    Spouse Name: N/A    Number of Children: N/A  . Years of Education: N/A   Occupational History  . disability      10/2006--has not worked since 2004   Social History Main Topics  . Smoking status: Former Smoker    Types: Cigarettes    Quit date: 03/13/1986  . Smokeless tobacco: Never Used  . Alcohol Use: No     Comment: never--used to drink heavily  . Drug Use: No  . Sexually Active: None   Other Topics Concern  . None  Social History Narrative   Registration notes: Red bag given 05/10/08.         Review of Systems A full 12 system review of systems is negative except as noted in the HPI.   Objective:  Physical Exam: Filed Vitals:   05/12/12 1457  BP: 137/87  Pulse: 68  Temp: 97.2 F (36.2 C)  TempSrc: Oral  Weight: 325 lb 1.6 oz (147.464 kg)  SpO2: 96%   Constitutional: Vital signs reviewed.  Patient is a well-developed and well-nourished man in no acute distress and cooperative with exam. Alert and oriented x3.  Head: Normocephalic and atraumatic Ear: TM normal bilaterally Nose: mild turbinate erythema with clear drainage.  Mouth: no erythema or exudates, MMM Eyes: PERRL, EOMI, conjunctivae normal, No scleral icterus.  Neck: Supple, Trachea midline normal ROM, No JVD, mass, thyromegaly, or carotid bruit present.  Cardiovascular: RRR, S1 normal, S2 normal, no MRG, pulses symmetric  and intact bilaterally Pulmonary/Chest: CTAB, no wheezes, rales, or rhonchi Abdominal: Soft. Non-tender, non-distended, bowel sounds are normal, no masses, organomegaly, or guarding present.  GU: no CVA tenderness or genital lesion.  Musculoskeletal: No joint deformities, erythema, or stiffness, ROM full and no nontender Hematology: no cervical, inginal, or axillary adenopathy.  Neurological: A&O x3, Strength is normal and symmetric bilaterally, cranial nerve II-XII are grossly intact, no focal motor deficit, sensory intact to light touch bilaterally.  Skin: Warm, dry and intact. No rash, cyanosis, or clubbing.  Psychiatric: Normal mood and affect. speech and behavior is normal. Judgment and thought content normal. Cognition and memory are normal.   Assessment & Plan:

## 2012-05-12 NOTE — Patient Instructions (Addendum)
1.  Increase the Zyrtec 5 mg to twice daily.  2.  Increase the Pepcid to 20 mg twice daily.    3.  Make sure after you lift that you ice your shoulders and elbows.   4.  We will check your urine.  If we need to discuss the results I will call you.  5.  I will call you if we need to discuss any of the lab tests.  6.  Follow up in about 3 months to meet your new primary care doctor.

## 2012-05-13 LAB — URINALYSIS, ROUTINE W REFLEX MICROSCOPIC
Glucose, UA: NEGATIVE mg/dL
Hgb urine dipstick: NEGATIVE
Leukocytes, UA: NEGATIVE
pH: 6 (ref 5.0–8.0)

## 2012-05-18 NOTE — Assessment & Plan Note (Signed)
He has noticed some increase in his coughing in the morning along with congestion recently.  He also states that he is having some post nasal drip.  He has been using the Zyrtec 5 mg daily.  We will increase that to 5 mg BID to see if we can get better control during allergy season here.

## 2012-05-18 NOTE — Assessment & Plan Note (Signed)
He has been taking Pepcid 20 mg qhs and has had some mild breakthrough reflux symptoms.  We will increase to 20 mg BID for better control and see if it helps with his chronic cough.

## 2012-05-18 NOTE — Assessment & Plan Note (Signed)
BP Readings from Last 3 Encounters:  05/12/12 137/87  02/14/12 143/77  08/25/11 124/80    Lab Results  Component Value Date   NA 140 05/12/2012   K 4.3 05/12/2012   CREATININE 1.10 05/12/2012    Assessment: Blood pressure control: controlled Progress toward BP goal:  at goal Comments: HE states that he has been taking his medications and has tolerated things well.  He was switched to Losartan from Benicar because of cost and is doing well.  Blood pressure today is at goal today.   Plan: Medications:  continue current medications Educational resources provided: brochure;handout;video Self management tools provided:   Other plans: none

## 2012-05-18 NOTE — Assessment & Plan Note (Signed)
Lab Results  Component Value Date   CHOL 126 05/12/2012   CHOL 125 05/14/2011   CHOL 159 12/09/2010   Lab Results  Component Value Date   HDL 35* 05/12/2012   HDL 35* 05/14/2011   HDL 40 16/10/9602   Lab Results  Component Value Date   LDLCALC 68 05/12/2012   LDLCALC 70 05/14/2011   LDLCALC 90 12/09/2010   Lab Results  Component Value Date   TRIG 117 05/12/2012   TRIG 98 05/14/2011   TRIG 145 12/09/2010   Lab Results  Component Value Date   CHOLHDL 3.6 05/12/2012   CHOLHDL 3.6 05/14/2011   CHOLHDL 4.0 12/09/2010   No results found for this basename: LDLDIRECT   LDL is at goal though his HDL is low.  He will continue on his simvastatin at his current dose and we will continue to monitor since he is not having any side effects today.

## 2012-05-18 NOTE — Assessment & Plan Note (Signed)
His urinary incontinence is consistent with stress urinary incontinence vs overflow incontinence.  He has not noticed a decrease in his urine output and denies any abdominal pain.  This is likely secondary to his years of power lifting.  He was advised on the Kegel exercises to strengthen his pelvic floor muscles to help limit drainage.  Urinalysis was negative for blood or any other worrisome signs.

## 2012-05-30 NOTE — Progress Notes (Signed)
Case discussed with Dr. Pribula soon after the resident saw the patient.  We reviewed the resident's history and exam and pertinent patient test results.  I agree with the assessment, diagnosis and plan of care documented in the resident's note. 

## 2012-06-06 ENCOUNTER — Ambulatory Visit (INDEPENDENT_AMBULATORY_CARE_PROVIDER_SITE_OTHER): Payer: Medicare Other | Admitting: Surgery

## 2012-06-06 ENCOUNTER — Encounter (INDEPENDENT_AMBULATORY_CARE_PROVIDER_SITE_OTHER): Payer: Self-pay | Admitting: Surgery

## 2012-06-06 VITALS — BP 124/74 | HR 82 | Resp 20 | Ht 75.0 in | Wt 320.0 lb

## 2012-06-06 DIAGNOSIS — K4090 Unilateral inguinal hernia, without obstruction or gangrene, not specified as recurrent: Secondary | ICD-10-CM | POA: Insufficient documentation

## 2012-06-06 NOTE — Patient Instructions (Addendum)
Call me if you decide you wish to schedule surgery to repair your right inguinal hernia

## 2012-06-06 NOTE — Progress Notes (Signed)
NAME: Eduardo Duncan DOB: Oct 15, 1960 MRN: [id number]                                                                                      DATE: 06/06/2012  PCP: Leodis Sias, MD Referring Provider: Leodis Sias, MD  IMPRESSION:  Reducible asymptomatic right inguinal hernia  PLAN:   I have discussed with the patient the fact that he has an inguinal hernia and reviewed the pathophysiology of that diagnosis. I have given him educational materials about this. I discussed options for treatment including observation or repair and have discussed both laparoscopic and open inguinal hernia repairs as potential techniques. We have discussed the use of mesh. We have discussed risks of surgery including bleeding, infection, recurrence, postoperative pain and possible chronic groin pain, testicular injury, and potential issues with voiding. I believe the patient's questions have been answered and that he has a good understanding of the issues Since he is asymptomatic and has issues with his back, he wishes to defer any surgery now. He is aware of risks of incarceration. If he elects surgery I think, given his weight, he would be better done as a laparoscopic procedure                 CC:  Chief Complaint  Patient presents with  . Inguinal Hernia    Right    HPI:  Eduardo Duncan is a 52 y.o.  male who presents for evaluation of RIH It was noted on PE by his primary care, and he says he was unaware of it until then and has no symptoms. I did a Lap chole on him in 2011.  PMH:  has a past medical history of Allergy; Dizziness; Osteoporosis; Hypertension; Depression; GERD (gastroesophageal reflux disease); Hyperlipidemia; Polycythemia rubra vera; Obstructive sleep apnea; Spinal stenosis of lumbar region; Arthritis; and Asthma.  PSH:   has past surgical history that includes Cholecystectomy (07/2009); Colonoscopy (01/2009); and Cholecystectomy (07/11/2009).  ALLERGIES:   Allergies  Allergen  Reactions  . Ace Inhibitors   . Advair Diskus (Fluticasone-Salmeterol) Other (See Comments)    Confusion and paranoid  . Codeine     REACTION: nausea  . Montelukast Sodium Other (See Comments)    Confusion and paranoid  . Prednisone Other (See Comments)    Confusion and paranoid  . Prednisolone Anxiety    MEDICATIONS: Current outpatient prescriptions:albuterol (PROVENTIL,VENTOLIN) 90 MCG/ACT inhaler, Inhale 2 puffs into the lungs every 6 (six) hours as needed. For shortness of breath, Disp: 17 g, Rfl: 3;  aspirin (ASPIRIN ADULT LOW STRENGTH) 81 MG EC tablet, Take 81 mg by mouth daily.  , Disp: , Rfl: ;  carvedilol (COREG) 12.5 MG tablet, Take 1 tablet (12.5 mg total) by mouth 2 (two) times daily with a meal., Disp: 180 tablet, Rfl: 6 cetirizine (ZYRTEC) 10 MG chewable tablet, Chew 1 tablet (10 mg total) by mouth daily., Disp: 30 tablet, Rfl: 3;  famotidine (PEPCID AC MAXIMUM STRENGTH) 20 MG tablet, Take 1 tablet (20 mg total) by mouth 2 (two) times daily., Disp: 60 tablet, Rfl: 3;  losartan (COZAAR) 25 MG tablet, Take 1 tablet (25 mg total) by mouth daily., Disp:  90 tablet, Rfl: 3 simvastatin (ZOCOR) 40 MG tablet, Take 1 tablet (40 mg total) by mouth at bedtime., Disp: 90 tablet, Rfl: 3;  traMADol (ULTRAM) 50 MG tablet, Take 1 tablet (50 mg total) by mouth 3 (three) times daily as needed., Disp: 90 tablet, Rfl: 5  ROS: See notes in Epic. EXAM:   VS: BP 124/74  Pulse 82  Resp 20  Ht 6\' 3"  (1.905 m)  Wt 320 lb (145.151 kg)  BMI 40 kg/m2 General: Obese gentleman, alert, orietned, NAD Abd: Soft and benign, reducible RIH, no LIH  DATA REVIEWED:  Notes in Epic    Jadakiss Barish J 06/06/2012  CC: Leodis Sias, MD, Leodis Sias, MD

## 2012-08-08 ENCOUNTER — Encounter: Payer: Medicare Other | Attending: Physical Medicine & Rehabilitation

## 2012-08-08 ENCOUNTER — Ambulatory Visit (HOSPITAL_BASED_OUTPATIENT_CLINIC_OR_DEPARTMENT_OTHER): Payer: Medicare Other | Admitting: Physical Medicine & Rehabilitation

## 2012-08-08 ENCOUNTER — Encounter: Payer: Self-pay | Admitting: Physical Medicine & Rehabilitation

## 2012-08-08 VITALS — BP 140/79 | HR 67 | Resp 14 | Ht 76.0 in | Wt 316.2 lb

## 2012-08-08 DIAGNOSIS — M48061 Spinal stenosis, lumbar region without neurogenic claudication: Secondary | ICD-10-CM | POA: Insufficient documentation

## 2012-08-08 DIAGNOSIS — IMO0002 Reserved for concepts with insufficient information to code with codable children: Secondary | ICD-10-CM

## 2012-08-08 DIAGNOSIS — R209 Unspecified disturbances of skin sensation: Secondary | ICD-10-CM

## 2012-08-08 DIAGNOSIS — M810 Age-related osteoporosis without current pathological fracture: Secondary | ICD-10-CM | POA: Insufficient documentation

## 2012-08-08 DIAGNOSIS — K4090 Unilateral inguinal hernia, without obstruction or gangrene, not specified as recurrent: Secondary | ICD-10-CM | POA: Insufficient documentation

## 2012-08-08 DIAGNOSIS — I10 Essential (primary) hypertension: Secondary | ICD-10-CM | POA: Insufficient documentation

## 2012-08-08 MED ORDER — TIZANIDINE HCL 2 MG PO TABS: 2.0000 mg | ORAL_TABLET | Freq: Four times a day (QID) | ORAL | Status: DC | PRN

## 2012-08-08 NOTE — Progress Notes (Signed)
Subjective:    Patient ID: Eduardo Duncan, male    DOB: [birth year], 52 y.o.   MRN: [id number]  HPI 80 pound weight loss through diet and exercise Reducible asymptomatic right inguinal hernia evaluated by surgery Chief complaint is increasing thoracic pain. Has had some episodes where legs have gotten numb with prolonged sitting. No recent trauma. No falls. Lifting weight but not doing any heavy lifting with free weights.   Pain Inventory Average Pain 6 Pain Right Now 7 My pain is sharp, burning and aching  In the last 24 hours, has pain interfered with the following? General activity 5 Relation with others 5 Enjoyment of life 5 What TIME of day is your pain at its worst? varies Sleep (in general) Poor  Pain is worse with: walking, bending and standing Pain improves with: medication and TENS Relief from Meds: 5  Mobility walk without assistance  Function disabled: date disabled 2003  Neuro/Psych weakness numbness tremor tingling dizziness  Prior Studies Any changes since last visit?  no  Physicians involved in your care Any changes since last visit?  no   Family History  Problem Relation Age of Onset  . Diabetes Mother   . Hypertension Mother   . Alzheimer's disease Mother   . Heart disease Mother   . Heart disease Father   . Cancer Brother   . Cancer Maternal Grandmother   . Cancer Paternal Grandmother    History   Social History  . Marital Status: Single    Spouse Name: N/A    Number of Children: N/A  . Years of Education: N/A   Occupational History  . disability      10/2006--has not worked since 2004   Social History Main Topics  . Smoking status: Former Smoker    Types: Cigarettes    Quit date: 03/13/1986  . Smokeless tobacco: Never Used  . Alcohol Use: No     Comment: never--used to drink heavily  . Drug Use: No  . Sexually Active: None   Other Topics Concern  . None   Social History Narrative   Registration notes: Red bag  given 05/10/08.         Past Surgical History  Procedure Laterality Date  . Cholecystectomy  07/2009    Lap  . Colonoscopy  01/2009  . Cholecystectomy  07/11/2009   Past Medical History  Diagnosis Date  . Allergy   . Dizziness   . Osteoporosis   . Hypertension   . Depression   . GERD (gastroesophageal reflux disease)   . Hyperlipidemia   . Polycythemia rubra vera   . Obstructive sleep apnea   . Spinal stenosis of lumbar region   . Arthritis   . Asthma    BP 140/79  Pulse 67  Resp 14  Ht 6\' 4"  (1.93 m)  Wt 316 lb 3.2 oz (143.427 kg)  BMI 38.5 kg/m2  SpO2 95%   Review of Systems  Respiratory: Positive for apnea.   Gastrointestinal: Positive for abdominal pain.  Neurological: Positive for dizziness, tremors, weakness and numbness.       Tingling  All other systems reviewed and are negative.       Objective:   Physical Exam Motor strength is 5/5 in both lower extremity Sensation is intact in both lower extremities. No sensory level in the thoracic area.  Deep tendon reflexes are normal. Gait is normal. Lumbar range of motion mildly restricted.       Assessment & Plan:  1. Thoracic  pain with a known thoracic disc. He's had increasing pain over the last 6 months. This has persisted despite prescription dosage ibuprofen. Will check repeat thoracic MRI. Last study was 8 years ago. If this study is negative may consider the diagnosis of piriformis syndrome, would recommend EMG to help further assess plus minus ultrasound

## 2012-08-08 NOTE — Patient Instructions (Signed)
New medicine for muscle spasms at night

## 2012-08-14 ENCOUNTER — Ambulatory Visit: Payer: Medicare Other | Admitting: Physical Medicine & Rehabilitation

## 2012-08-29 ENCOUNTER — Ambulatory Visit
Admission: RE | Admit: 2012-08-29 | Discharge: 2012-08-29 | Disposition: A | Discharge status: Home / Self Care | Payer: Medicare Other | Source: Ambulatory Visit | Attending: Physical Medicine & Rehabilitation | Admitting: Physical Medicine & Rehabilitation

## 2012-08-29 DIAGNOSIS — IMO0002 Reserved for concepts with insufficient information to code with codable children: Secondary | ICD-10-CM

## 2012-09-05 ENCOUNTER — Ambulatory Visit: Payer: Medicare Other | Admitting: Physical Medicine & Rehabilitation

## 2012-09-12 ENCOUNTER — Ambulatory Visit (HOSPITAL_BASED_OUTPATIENT_CLINIC_OR_DEPARTMENT_OTHER): Payer: Medicare Other | Admitting: Physical Medicine & Rehabilitation

## 2012-09-12 ENCOUNTER — Encounter: Payer: Medicare Other | Attending: Physical Medicine & Rehabilitation

## 2012-09-12 ENCOUNTER — Encounter: Payer: Self-pay | Admitting: Physical Medicine & Rehabilitation

## 2012-09-12 VITALS — BP 124/71 | HR 60 | Resp 14 | Ht 76.0 in | Wt 319.8 lb

## 2012-09-12 DIAGNOSIS — I10 Essential (primary) hypertension: Secondary | ICD-10-CM | POA: Insufficient documentation

## 2012-09-12 DIAGNOSIS — M4326 Fusion of spine, lumbar region: Secondary | ICD-10-CM

## 2012-09-12 DIAGNOSIS — M48061 Spinal stenosis, lumbar region without neurogenic claudication: Secondary | ICD-10-CM | POA: Insufficient documentation

## 2012-09-12 DIAGNOSIS — K4090 Unilateral inguinal hernia, without obstruction or gangrene, not specified as recurrent: Secondary | ICD-10-CM | POA: Insufficient documentation

## 2012-09-12 DIAGNOSIS — IMO0002 Reserved for concepts with insufficient information to code with codable children: Secondary | ICD-10-CM

## 2012-09-12 DIAGNOSIS — M810 Age-related osteoporosis without current pathological fracture: Secondary | ICD-10-CM | POA: Insufficient documentation

## 2012-09-12 NOTE — Progress Notes (Signed)
Subjective:    Patient ID: Eduardo Duncan, male    DOB: June 22, 1960, 52 y.o.   MRN: [id number]  HPI  Thoracic progression of hyperostosis  No radiating thoracic pain No lower ext pain or weakness  Pain Inventory Average Pain 6 Pain Right Now 8 My pain is sharp and stabbing  In the last 24 hours, has pain interfered with the following? General activity 7 Relation with others 6 Enjoyment of life 7 What TIME of day is your pain at its worst? morning Sleep (in general) Poor  Pain is worse with: some activites Pain improves with: medication Relief from Meds: 6  Mobility walk without assistance how many minutes can you walk? 5-10 ability to climb steps?  yes do you drive?  yes  Function disabled: date disabled 2005  Neuro/Psych weakness numbness tremor tingling spasms  Prior Studies Any changes since last visit?  yes IMPRESSION:  1. 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 There are multiple paraspinal osteophytes. These findings are  likely due to underlying diffuse idiopathic skeletal hyperostosis.  The sacroiliac joints did not appear fused on the prior lumbar MRI,  and ankylosing spondylitis is considered less likely.  2. 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  3. Stable mild superimposed spondylosis. No acute findings  identified.  Physicians involved in your care Any changes since last visit?  no   Family History  Problem Relation Age of Onset  . Diabetes Mother   . Hypertension Mother   . Alzheimer's disease Mother   . Heart disease Mother   . Heart disease Father   . Cancer Brother   . Cancer Maternal Grandmother   . Cancer Paternal Grandmother    History   Social History  . Marital Status: Single    Spouse Name: N/A    Number of Children: N/A  . Years of Education: N/A   Occupational History  . disability      10/2006--has not worked since 2004   Social History Main  Topics  . Smoking status: Former Smoker    Types: Cigarettes    Quit date: 03/13/1986  . Smokeless tobacco: Never Used  . Alcohol Use: No     Comment: never--used to drink heavily  . Drug Use: No  . Sexual Activity: None   Other Topics Concern  . None   Social History Narrative   Registration notes: Red bag given 05/10/08.         Past Surgical History  Procedure Laterality Date  . Cholecystectomy  07/2009    Lap  . Colonoscopy  01/2009  . Cholecystectomy  07/11/2009   Past Medical History  Diagnosis Date  . Allergy   . Dizziness   . Osteoporosis   . Hypertension   . Depression   . GERD (gastroesophageal reflux disease)   . Hyperlipidemia   . Polycythemia rubra vera   . Obstructive sleep apnea   . Spinal stenosis of lumbar region   . Arthritis   . Asthma    BP 124/71  Pulse 60  Resp 14  Ht 6\' 4"  (1.93 m)  Wt 319 lb 12.8 oz (145.06 kg)  BMI 38.94 kg/m2  SpO2 95%    Review of Systems  Musculoskeletal: Positive for back pain.       Spasms  Neurological: Positive for weakness and numbness.       Tingling  All other systems reviewed and are negative.       Objective:  Physical Exam  Nursing note and vitals reviewed. Constitutional: He is oriented to person, place, and time. He appears well-developed and well-nourished.  HENT:  Head: Normocephalic and atraumatic.  Eyes: Conjunctivae and EOM are normal. Pupils are equal, round, and reactive to light.  Neck: Normal range of motion.  Musculoskeletal:       Thoracic back: He exhibits decreased range of motion, tenderness and bony tenderness. He exhibits no deformity and no spasm.  Tenderness T5-8 spinous process and paraspinal muscle  Neurological: He is alert and oriented to person, place, and time.  Psychiatric: He has a normal mood and affect.   Parsthesia L 5th digit -tinel's No hand intrinsic atrophy       Assessment & Plan:  1.  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  Now in  thoracic region, no SI joint involvement to suggest AS but will ask rheumatology to eval. Rx NSAID, tyleniol Cont exercise, swimming, light resistive  2.  Cerical stenosis with increasing LUE numbness EMG 2012 neg, progressive symptoms will repeat

## 2012-09-12 NOTE — Patient Instructions (Signed)
IMPRESSION:  1. 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 There are multiple paraspinal osteophytes. These findings are  likely due to underlying diffuse idiopathic skeletal hyperostosis.  The sacroiliac joints did not appear fused on the prior lumbar MRI,  and ankylosing spondylitis is considered less likely.  2. 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  3. Stable mild superimposed spondylosis. No acute findings  identified.

## 2012-10-16 ENCOUNTER — Ambulatory Visit (HOSPITAL_BASED_OUTPATIENT_CLINIC_OR_DEPARTMENT_OTHER): Payer: Medicare Other | Admitting: Physical Medicine & Rehabilitation

## 2012-10-16 ENCOUNTER — Encounter: Payer: Medicare Other | Attending: Physical Medicine & Rehabilitation

## 2012-10-16 ENCOUNTER — Encounter: Payer: Self-pay | Admitting: Physical Medicine & Rehabilitation

## 2012-10-16 VITALS — BP 143/73 | HR 69 | Resp 14 | Ht 74.0 in | Wt 328.0 lb

## 2012-10-16 DIAGNOSIS — K4090 Unilateral inguinal hernia, without obstruction or gangrene, not specified as recurrent: Secondary | ICD-10-CM | POA: Insufficient documentation

## 2012-10-16 DIAGNOSIS — M48061 Spinal stenosis, lumbar region without neurogenic claudication: Secondary | ICD-10-CM | POA: Insufficient documentation

## 2012-10-16 DIAGNOSIS — M810 Age-related osteoporosis without current pathological fracture: Secondary | ICD-10-CM | POA: Insufficient documentation

## 2012-10-16 DIAGNOSIS — I10 Essential (primary) hypertension: Secondary | ICD-10-CM | POA: Insufficient documentation

## 2012-10-16 DIAGNOSIS — R209 Unspecified disturbances of skin sensation: Secondary | ICD-10-CM

## 2012-10-16 DIAGNOSIS — IMO0002 Reserved for concepts with insufficient information to code with codable children: Secondary | ICD-10-CM | POA: Insufficient documentation

## 2012-10-16 NOTE — Patient Instructions (Addendum)
Will review the results of the EMG test as well as right a report  Discuss next visit

## 2012-10-16 NOTE — Progress Notes (Signed)
EMG performed 10/16/2012.  See EMG report under media tab.

## 2012-11-17 ENCOUNTER — Ambulatory Visit (HOSPITAL_BASED_OUTPATIENT_CLINIC_OR_DEPARTMENT_OTHER): Payer: Medicare Other | Admitting: Physical Medicine & Rehabilitation

## 2012-11-17 ENCOUNTER — Encounter: Payer: Self-pay | Admitting: Physical Medicine & Rehabilitation

## 2012-11-17 ENCOUNTER — Encounter: Payer: Medicare Other | Attending: Physical Medicine & Rehabilitation

## 2012-11-17 VITALS — BP 161/82 | HR 77 | Resp 14 | Ht 76.0 in | Wt 328.0 lb

## 2012-11-17 DIAGNOSIS — M542 Cervicalgia: Secondary | ICD-10-CM

## 2012-11-17 DIAGNOSIS — R209 Unspecified disturbances of skin sensation: Secondary | ICD-10-CM

## 2012-11-17 DIAGNOSIS — M810 Age-related osteoporosis without current pathological fracture: Secondary | ICD-10-CM | POA: Insufficient documentation

## 2012-11-17 DIAGNOSIS — I10 Essential (primary) hypertension: Secondary | ICD-10-CM | POA: Insufficient documentation

## 2012-11-17 DIAGNOSIS — IMO0001 Reserved for inherently not codable concepts without codable children: Secondary | ICD-10-CM

## 2012-11-17 DIAGNOSIS — M48061 Spinal stenosis, lumbar region without neurogenic claudication: Secondary | ICD-10-CM | POA: Insufficient documentation

## 2012-11-17 DIAGNOSIS — IMO0002 Reserved for concepts with insufficient information to code with codable children: Secondary | ICD-10-CM | POA: Insufficient documentation

## 2012-11-17 DIAGNOSIS — K4090 Unilateral inguinal hernia, without obstruction or gangrene, not specified as recurrent: Secondary | ICD-10-CM | POA: Insufficient documentation

## 2012-11-17 DIAGNOSIS — R42 Dizziness and giddiness: Secondary | ICD-10-CM

## 2012-11-17 DIAGNOSIS — M47814 Spondylosis without myelopathy or radiculopathy, thoracic region: Secondary | ICD-10-CM

## 2012-11-17 NOTE — Patient Instructions (Signed)
Made a referral to neurology  If you cannot get in, call and make referral to another groupStroke Prevention Some medical conditions and behaviors are associated with an increased chance of having a stroke. You may prevent a stroke by making healthy choices and managing medical conditions. Reduce your risk of having a stroke by:  Staying physically active. Get at least 30 minutes of activity on most or all days.  Not smoking. It may also be helpful to avoid exposure to secondhand smoke.  Limiting alcohol use. Moderate alcohol use is considered to be:  No more than 2 drinks per day for men.  No more than 1 drink per day for nonpregnant women.  Eating healthy foods.  Include 5 or more servings of fruits and vegetables a day.  Certain diets may be prescribed to address high blood pressure, high cholesterol, diabetes, or obesity.  Managing your cholesterol levels.  A low-saturated fat, low-trans fat, low-cholesterol, and high-fiber diet may control cholesterol levels.  Take any prescribed medicines to control cholesterol as directed by your caregiver.  Managing your diabetes.  A controlled-carbohydrate, controlled-sugar diet is recommended to manage diabetes.  Take any prescribed medicines to control diabetes as directed by your caregiver.  Controlling your high blood pressure (hypertension).  A low-salt (sodium), low-saturated fat, low-trans fat, and low-cholesterol diet is recommended to manage high blood pressure.  Take any prescribed medicines to control hypertension as directed by your caregiver.  Maintaining a healthy weight.  A reduced-calorie, low-sodium, low-saturated fat, low-trans fat, low-cholesterol diet is recommended to manage weight.  Stopping drug abuse.  Avoiding birth control pills.  Talk to your caregiver about the risks of taking birth control pills if you are over 53 years old, smoke, get migraines, or have ever had a blood clot.  Getting evaluated  for sleep disorders (sleep apnea).  Talk to your caregiver about getting a sleep evaluation if you snore a lot or have excessive sleepiness.  Taking medicines as directed by your caregiver.  For some people, aspirin or blood thinners (anticoagulants) are helpful in reducing the risk of forming abnormal blood clots that can lead to stroke. If you have the irregular heart rhythm of atrial fibrillation, you should be on a blood thinner unless there is a good reason you cannot take them.  Understand all your medicine instructions. SEEK IMMEDIATE MEDICAL CARE IF:   You have sudden weakness or numbness of the face, arm, or leg, especially on one side of the body.  You have sudden confusion.  You have trouble speaking (aphasia) or understanding.  You have sudden trouble seeing in one or both eyes.  You have sudden trouble walking.  You have dizziness.  You have a loss of balance or coordination.  You have a sudden, severe headache with no known cause.  You have new chest pain or an irregular heartbeat. Any of these symptoms may represent a serious problem that is an emergency. Do not wait to see if the symptoms will go away. Get medical help right away. Call your local emergency services (911 in [place] Do not drive yourself to the hospital. Document Released: 02/05/2004 Document Revised: 03/22/2011 Document Reviewed: 06/30/2012 [facility] Patient Information 2014 [place], [place].

## 2012-11-17 NOTE — Progress Notes (Signed)
Subjective:    Patient ID: Eduardo Duncan, male    DOB: 07/07/1960, 52 y.o.   MRN: [id number]  HPI Discussed EMG results. No evidence of ulnar neuropathy 600mg  Ibuprofen and 500mg  tylenol every 6 hours Some facial numbness at the same time as neck pain comes on Some coordination problems L>R  Some balance problems has been having some sinus symptoms Pain Inventory Average Pain 6 Pain Right Now 7 My pain is constant, sharp, stabbing and aching  In the last 24 hours, has pain interfered with the following? General activity 5 Relation with others 6 Enjoyment of life 6 What TIME of day is your pain at its worst? morning Sleep (in general) Poor  Pain is worse with: some activites Pain improves with: rest, heat/ice and medication Relief from Meds: 6  Mobility walk without assistance ability to climb steps?  yes do you drive?  yes  Function disabled: date disabled .  Neuro/Psych weakness numbness tremor tingling dizziness  Prior Studies Any changes since last visit?  no  Physicians involved in your care Any changes since last visit?  no   Family History  Problem Relation Age of Onset  . Diabetes Mother   . Hypertension Mother   . Alzheimer's disease Mother   . Heart disease Mother   . Heart disease Father   . Cancer Brother   . Cancer Maternal Grandmother   . Cancer Paternal Grandmother    History   Social History  . Marital Status: Single    Spouse Name: N/A    Number of Children: N/A  . Years of Education: N/A   Occupational History  . disability      10/2006--has not worked since 2004   Social History Main Topics  . Smoking status: Former Smoker    Types: Cigarettes    Quit date: 03/13/1986  . Smokeless tobacco: Never Used  . Alcohol Use: No     Comment: never--used to drink heavily  . Drug Use: No  . Sexual Activity: None   Other Topics Concern  . None   Social History Narrative   Registration notes: Red bag given 05/10/08.          Past Surgical History  Procedure Laterality Date  . Cholecystectomy  07/2009    Lap  . Colonoscopy  01/2009  . Cholecystectomy  07/11/2009   Past Medical History  Diagnosis Date  . Allergy   . Dizziness   . Osteoporosis   . Hypertension   . Depression   . GERD (gastroesophageal reflux disease)   . Hyperlipidemia   . Polycythemia rubra vera   . Obstructive sleep apnea   . Spinal stenosis of lumbar region   . Arthritis   . Asthma    BP 168/82  Pulse 77  Resp 14  Ht 6\' 4"  (1.93 m)  Wt 328 lb (148.78 kg)  BMI 39.94 kg/m2  SpO2 95%     Review of Systems  Musculoskeletal: Positive for back pain and neck pain.  Neurological: Positive for dizziness, tremors, weakness and numbness.  All other systems reviewed and are negative.       Objective:   Physical Exam  Nursing note and vitals reviewed. Constitutional: He is oriented to person, place, and time. He appears well-developed and well-nourished.  HENT:  Head: Normocephalic and atraumatic.  Eyes: Conjunctivae and EOM are normal. Pupils are equal, round, and reactive to light.  Musculoskeletal: Normal range of motion.  Neurological: He is alert and oriented to person, place,  and time. He has normal strength. No sensory deficit.  Reflex Scores:      Tricep reflexes are 1+ on the right side and 1+ on the left side.      Bicep reflexes are 1+ on the right side and 1+ on the left side.      Brachioradialis reflexes are 1+ on the right side and 1+ on the left side.      Patellar reflexes are 1+ on the right side and 1+ on the left side. Psychiatric: He has a normal mood and affect.          Assessment & Plan:  1. Thoracic disc with thoracic spondylosis. No evidence of spinal cord impingement 2. Cervical spondylosis and stenosis C3-4 and C4-5 this could explain upper extremity numbness as well as coordination issues however would not explain facial numbness. Will make referral to go for neurology to further  evaluate. 3. Patient also brings up diffuse body aches pains. States a rheumatologist has diagnosed him with fibromyalgia in the past. He states that he gets anxious with every aching pain because his brother had cancer and his father had cancer. Patient will followup with me after seeing neurology. Consider trial of Cymbalta or Lyrica

## 2012-11-21 ENCOUNTER — Telehealth: Payer: Self-pay | Admitting: Physical Medicine & Rehabilitation

## 2012-11-21 NOTE — Telephone Encounter (Signed)
Per Maralyn Sago voicemail today.. Dr. Cardell Peach have reviewed pt's chart twice and unfortunately has to decline seeing the patient.. There nothing to offer the patient.... Ad   Where would Dr. Wynn Banker like to referral pt now or what should pt do?

## 2012-11-22 NOTE — Telephone Encounter (Signed)
Will not pursue rheumatology  Please make appt for neurology only

## 2012-12-21 ENCOUNTER — Encounter: Payer: Self-pay | Admitting: Neurology

## 2012-12-25 ENCOUNTER — Encounter: Payer: Self-pay | Admitting: Neurology

## 2012-12-25 ENCOUNTER — Ambulatory Visit (INDEPENDENT_AMBULATORY_CARE_PROVIDER_SITE_OTHER): Payer: Medicare Other | Admitting: Neurology

## 2012-12-25 ENCOUNTER — Encounter (INDEPENDENT_AMBULATORY_CARE_PROVIDER_SITE_OTHER): Payer: Self-pay

## 2012-12-25 VITALS — BP 153/93 | HR 66 | Ht 74.0 in | Wt 329.0 lb

## 2012-12-25 DIAGNOSIS — M47814 Spondylosis without myelopathy or radiculopathy, thoracic region: Secondary | ICD-10-CM

## 2012-12-25 DIAGNOSIS — R519 Headache, unspecified: Secondary | ICD-10-CM | POA: Insufficient documentation

## 2012-12-25 DIAGNOSIS — M542 Cervicalgia: Secondary | ICD-10-CM

## 2012-12-25 DIAGNOSIS — R51 Headache: Secondary | ICD-10-CM

## 2012-12-25 DIAGNOSIS — R209 Unspecified disturbances of skin sensation: Secondary | ICD-10-CM | POA: Insufficient documentation

## 2012-12-25 DIAGNOSIS — G4486 Cervicogenic headache: Secondary | ICD-10-CM

## 2012-12-25 HISTORY — DX: Cervicogenic headache: G44.86

## 2012-12-25 NOTE — Patient Instructions (Signed)
Chronic Back Pain   When back pain lasts longer than 3 months, it is called chronic back pain.People with chronic back pain often go through certain periods that are more intense (flare-ups).   CAUSES  Chronic back pain can be caused by wear and tear (degeneration) on different structures in your back. These structures include:   The bones of your spine (vertebrae) and the joints surrounding your spinal cord and nerve roots (facets).   The strong, fibrous tissues that connect your vertebrae (ligaments).  Degeneration of these structures may result in pressure on your nerves. This can lead to constant pain.  HOME CARE INSTRUCTIONS   Avoid bending, heavy lifting, prolonged sitting, and activities which make the problem worse.   Take brief periods of rest throughout the day to reduce your pain. Lying down or standing usually is better than sitting while you are resting.   Take over-the-counter or prescription medicines only as directed by your caregiver.  SEEK IMMEDIATE MEDICAL CARE IF:    You have weakness or numbness in one of your legs or feet.   You have trouble controlling your bladder or bowels.   You have nausea, vomiting, abdominal pain, shortness of breath, or fainting.  Document Released: 02/05/2004 Document Revised: 03/22/2011 Document Reviewed: 12/12/2010  [employer] Patient Information 2014 [employer].

## 2012-12-25 NOTE — Progress Notes (Signed)
Reason for visit: Numbness  Eduardo Duncan is a 52 y.o. male  History of present illness:  Mr. Gaffey is a 52 year old right-handed white male with a history of chronic neck and back pain. The patient describes a ten-year history of intermittent episodes of increased left-sided neck discomfort associated with some numbness going down into the left arm, and chronic numbness of the left hand. The patient has some clumsiness of the left hand. The patient also reports some intermittent left facial numbness and a sensation of drawing of the left face, without true weakness. The patient has decreased range of movement of the cervical spine. The patient has chronic daily headaches that are worse in the occipital area spreading around the head. The patient reports some numbness in the feet as well that are persistent, with intermittent numbness of the thighs. The patient denies any problems controlling the bowels or the bladder. The patient denies any significant balance issues. The patient has had no problems with speech or swallowing, or changes in vision. The last episode of left face and left arm numbness was almost 2 years ago. The patient had a recent nerve conduction study and EMG evaluation of the left arm that was unremarkable. The patient is sent to this office for an evaluation.  Past Medical History  Diagnosis Date  . Allergy   . Dizziness   . Osteoporosis   . Hypertension   . Depression   . GERD (gastroesophageal reflux disease)   . Hyperlipidemia   . Polycythemia rubra vera   . Obstructive sleep apnea   . Spinal stenosis of lumbar region   . Arthritis   . Asthma   . Cervicogenic headache 12/25/2012  . Obesity     Past Surgical History  Procedure Laterality Date  . Cholecystectomy  07/2009    Lap  . Colonoscopy  01/2009  . Cholecystectomy  07/11/2009    Family History  Problem Relation Age of Onset  . Diabetes Mother   . Hypertension Mother   . Alzheimer's disease  Mother   . Heart disease Mother   . Heart disease Father   . Cancer Brother   . Stroke Brother   . Cancer Maternal Grandmother   . Cancer Paternal Grandmother     Social history:  reports that he quit smoking about 26 years ago. His smoking use included Cigarettes. He smoked 0.00 packs per day. He has never used smokeless tobacco. He reports that he does not drink alcohol or use illicit drugs.  Medications:  Current Outpatient Prescriptions on File Prior to Visit  Medication Sig Dispense Refill  . albuterol (PROVENTIL,VENTOLIN) 90 MCG/ACT inhaler Inhale 2 puffs into the lungs every 6 (six) hours as needed. For shortness of breath  17 g  3  . aspirin (ASPIRIN ADULT LOW STRENGTH) 81 MG EC tablet Take 81 mg by mouth daily.        . carvedilol (COREG) 12.5 MG tablet Take 1 tablet (12.5 mg total) by mouth 2 (two) times daily with a meal.  180 tablet  6  . famotidine (PEPCID AC MAXIMUM STRENGTH) 20 MG tablet Take 1 tablet (20 mg total) by mouth 2 (two) times daily.  60 tablet  3  . losartan (COZAAR) 25 MG tablet Take 1 tablet (25 mg total) by mouth daily.  90 tablet  3  . simvastatin (ZOCOR) 40 MG tablet Take 1 tablet (40 mg total) by mouth at bedtime.  90 tablet  3   No current facility-administered  medications on file prior to visit.      Allergies  Allergen Reactions  . Ace Inhibitors   . Advair Diskus [Fluticasone-Salmeterol] Other (See Comments)    Confusion and paranoid  . Codeine     REACTION: nausea  . Montelukast Sodium Other (See Comments)    Confusion and paranoid  . Prednisone Other (See Comments)    Confusion and paranoid  . Prednisolone Anxiety    ROS:  Out of a complete 14 system review of symptoms, the patient complains only of the following symptoms, and all other reviewed systems are negative.  Joint pain, muscle cramps, achy muscles Allergies Itching Numbness, weakness, dizziness Insomnia  Blood pressure 153/93, pulse 66, height 6\' 2"  (1.88 m), weight 329  lb (149.233 kg).  Physical Exam  General: The patient is alert and cooperative at the time of the examination. The patient is moderately to markedly obese.  Head: Pupils are equal, round, and reactive to light. Discs are flat bilaterally.  Neck: The neck is supple, no carotid bruits are noted.  Respiratory: The respiratory examination is clear.  Cardiovascular: The cardiovascular examination reveals a regular rate and rhythm, no obvious murmurs or rubs are noted.  Neuromuscular: The patient lacks about 30-35 of full lateral rotation of the cervical spine bilaterally.  Skin: Extremities are without significant edema.  Neurologic Exam  Mental status: The patient is alert and oriented x 3 the time of the examination.  Cranial nerves: Facial symmetry is present. There is good sensation of the face to pinprick and soft touch bilaterally. The strength of the facial muscles and the muscles to head turning and shoulder shrug are normal bilaterally. Speech is well enunciated, no aphasia or dysarthria is noted. Extraocular movements are full. Visual fields are full.  Motor: The motor testing reveals 5 over 5 strength of all 4 extremities. Good symmetric motor tone is noted throughout.  Sensory: Sensory testing is intact to pinprick, soft touch, vibration sensation, and position sense on all 4 extremities, with exception of a stocking pattern pinprick sensory deficit in the distal feet bilaterally. No evidence of extinction is noted.  Coordination: Cerebellar testing reveals good finger-nose-finger and heel-to-shin bilaterally.  Gait and station: Gait is normal. Tandem gait is normal. Romberg is negative. No drift is seen.  Reflexes: Deep tendon reflexes are symmetric and normal bilaterally. Toes are downgoing bilaterally.   MRI cervical spine results: 2012  IMPRESSION:  1. No change in congenital and acquired central canal stenosis at  C3-4 where a central protrusion mildly deforms the  ventral cord.  2. No change in mild ventral cord deformity at C4-5.  3. No new central canal stenosis or nerve root compression.  MRI thoracic spine results: 07/2012  IMPRESSION:  1. 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 There are multiple paraspinal osteophytes. These findings are  likely due to underlying diffuse idiopathic skeletal hyperostosis.  The sacroiliac joints did not appear fused on the prior lumbar MRI,  and ankylosing spondylitis is considered less likely.  2. 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  3. Stable mild superimposed spondylosis. No acute findings  Identified.  MRI lumbosacral spine results: 12/2012  IMPRESSION:  Since the prior examination, there has been a decrease in the  degree of spinal stenosis at the L4-5 level and resolution of edema  surrounding the left L5-S1 facet joint.  Slight change in signal intensity along the periphery of the L2-3,  L3-4 and L4-5 disc may  represent mineral/calcium deposition.    Assessment/Plan:  1. Episodic left face and arm numbness  2. Cervical spondylosis, thoracic spondylosis  3. Cervicogenic headache  The patient has had MRI evaluation of the cervical spine and 2010 which shows evidence of straightening of the spine. The patient likely has chronic spasm of the neck which may lead to the left arm numbness, and the headaches, and potentially the left face numbness. The patient will be set up for MRI evaluation the brain to exclude the possibility of cerebrovascular disease or demyelinating disease. The patient will undergo some blood work evaluation to look for other etiologies of numbness of the extremities. The patient will followup if needed. Overall, the patient is actually doing better now than he was 2 years ago.  Marlan Palau MD 12/25/2012 7:39 PM  [facility] [address] [place], [place]  [address]  Phone [phone] Fax [phone]

## 2012-12-26 LAB — TSH: TSH: 0.496 u[IU]/mL (ref 0.450–4.500)

## 2012-12-26 LAB — ANA W/REFLEX: Anti Nuclear Antibody(ANA): NEGATIVE

## 2012-12-26 LAB — COPPER, SERUM: Copper: 98 ug/dL (ref 72–166)

## 2013-01-10 ENCOUNTER — Ambulatory Visit
Admission: RE | Admit: 2013-01-10 | Discharge: 2013-01-10 | Disposition: A | Discharge status: Home / Self Care | Payer: Medicare Other | Source: Ambulatory Visit | Attending: Neurology | Admitting: Neurology

## 2013-01-10 DIAGNOSIS — M47814 Spondylosis without myelopathy or radiculopathy, thoracic region: Secondary | ICD-10-CM

## 2013-01-10 DIAGNOSIS — R209 Unspecified disturbances of skin sensation: Secondary | ICD-10-CM

## 2013-01-10 DIAGNOSIS — M542 Cervicalgia: Secondary | ICD-10-CM

## 2013-01-14 ENCOUNTER — Telehealth: Payer: Self-pay | Admitting: Neurology

## 2013-01-14 NOTE — Telephone Encounter (Signed)
I called the patient. The MRI of the brain is normal. Blood work is normal.The patient likely has cervicogenic headache. ? If he wants medical therapy for this.

## 2013-01-19 ENCOUNTER — Other Ambulatory Visit: Payer: Self-pay | Admitting: *Deleted

## 2013-01-19 DIAGNOSIS — K219 Gastro-esophageal reflux disease without esophagitis: Secondary | ICD-10-CM

## 2013-01-19 MED ORDER — FAMOTIDINE 20 MG PO TABS: 20.0000 mg | ORAL_TABLET | Freq: Two times a day (BID) | ORAL | Status: DC

## 2013-02-12 ENCOUNTER — Ambulatory Visit (HOSPITAL_BASED_OUTPATIENT_CLINIC_OR_DEPARTMENT_OTHER): Payer: Medicare Other | Admitting: Physical Medicine & Rehabilitation

## 2013-02-12 ENCOUNTER — Encounter: Payer: Self-pay | Admitting: Physical Medicine & Rehabilitation

## 2013-02-12 ENCOUNTER — Encounter: Payer: Medicare Other | Attending: Physical Medicine & Rehabilitation

## 2013-02-12 VITALS — BP 162/84 | HR 71 | Resp 14 | Ht 76.0 in | Wt 332.0 lb

## 2013-02-12 DIAGNOSIS — I10 Essential (primary) hypertension: Secondary | ICD-10-CM | POA: Insufficient documentation

## 2013-02-12 DIAGNOSIS — M48061 Spinal stenosis, lumbar region without neurogenic claudication: Secondary | ICD-10-CM

## 2013-02-12 DIAGNOSIS — M109 Gout, unspecified: Secondary | ICD-10-CM

## 2013-02-12 DIAGNOSIS — M810 Age-related osteoporosis without current pathological fracture: Secondary | ICD-10-CM | POA: Insufficient documentation

## 2013-02-12 DIAGNOSIS — K4090 Unilateral inguinal hernia, without obstruction or gangrene, not specified as recurrent: Secondary | ICD-10-CM | POA: Insufficient documentation

## 2013-02-12 DIAGNOSIS — IMO0002 Reserved for concepts with insufficient information to code with codable children: Secondary | ICD-10-CM | POA: Insufficient documentation

## 2013-02-12 DIAGNOSIS — M4810 Ankylosing hyperostosis [Forestier], site unspecified: Secondary | ICD-10-CM | POA: Insufficient documentation

## 2013-02-12 MED ORDER — NAPROXEN 500 MG PO TABS: 500.0000 mg | ORAL_TABLET | Freq: Three times a day (TID) | ORAL | Status: DC

## 2013-02-12 NOTE — Patient Instructions (Signed)
Naproxen 500mg  three times a day

## 2013-02-12 NOTE — Progress Notes (Signed)
Subjective:    Patient ID: Eduardo Duncan, male    DOB: Jul 03, 1960, 53 y.o.   MRN: [id number] CC Left foot pain HPI  Left foot pain without trauma, hx of gout which affected dorsum of R foot in the past.  In April 2010 urate was 10.7.  Had Xray 2003 for dorsum of Left foot swelling.  Pain better with non wt bearing  Evaluated by Dr Jannifer Franklin normal brain MRI.   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 General activity 7 Relation with others 7 Enjoyment of life 7 What TIME of day is your pain at its worst? n/a Sleep (in general) Poor  Pain is worse with: walking and standing Pain improves with: medication Relief from Meds: 6  Mobility walk without assistance how many minutes can you walk? 10 ability to climb steps?  yes do you drive?  yes  Function disabled: date disabled .  Neuro/Psych bladder control problems weakness numbness tingling spasms dizziness  Prior Studies x-rays CT/MRI  Physicians involved in your care Primary care . Neurologist .   Family History  Problem Relation Age of Onset  . Diabetes Mother   . Hypertension Mother   . Alzheimer's disease Mother   . Heart disease Mother   . Heart disease Father   . Cancer Brother   . Stroke Brother   . Cancer Maternal Grandmother   . Cancer Paternal Grandmother    History   Social History  . Marital Status: Single    Spouse Name: N/A    Number of Children: 0  . Years of Education: trade   Occupational History  . disability      10/2006--has  not worked since 2004   Social History Main Topics  . Smoking status: Former Smoker    Types: Cigarettes    Quit date: 03/13/1986  . Smokeless tobacco: Never Used  . Alcohol Use: No     Comment: never--used to drink heavily  . Drug Use: No  . Sexual Activity: None   Other Topics Concern  . None   Social History Narrative   Registration notes: Red bag given 05/10/08.         Past Surgical History  Procedure Laterality Date  . Cholecystectomy  07/2009    Lap  . Colonoscopy  01/2009  . Cholecystectomy  07/11/2009   Past Medical History  Diagnosis Date  . Allergy   . Dizziness   . Osteoporosis   . Hypertension   . Depression   . GERD (gastroesophageal reflux disease)   . Hyperlipidemia   . Polycythemia rubra vera   . Obstructive sleep apnea   . Spinal stenosis of lumbar region   . Arthritis   . Asthma   . Cervicogenic headache 12/25/2012  . Obesity    BP 162/84  Pulse 71  Resp 14  Ht 6\' 4"  (1.93 m)  Wt 332 lb (150.594 kg)  BMI 40.43 kg/m2  SpO2 97%  Opioid Risk Score:   Fall Risk Score: Moderate Fall Risk (6-13 points) (  patient educated handout declined)   Review of Systems  Constitutional: Positive for chills and unexpected weight change.  Respiratory: Positive for apnea.   Cardiovascular: Positive for leg swelling.  Musculoskeletal: Positive for back pain and neck pain.  Neurological: Positive for dizziness, weakness and numbness.  All other systems reviewed and are negative.       Objective:   Physical Exam  Nursing note and vitals reviewed. Constitutional: He is oriented to person, place, and time. He appears well-developed.  obese  Neck: Normal range of motion.  Musculoskeletal:       Left ankle: Normal.       Left foot: He exhibits tenderness and swelling. He exhibits no deformity.  Left midfoot pain swelling and pain to palpation No pain with toe ROM  Neurological: He is alert and oriented to person, place, and time.   Gait no toe drag  or knee instability Mood /Affect approp      Assessment & Plan:  1.  Left dorsum foot swelling without trauma hx, has been diagnosed with gout in past with elevated urate.  Suspect recurrence.  Will start naproxen 500mg  TID WC for 10days, stop ibuprofen, f/u with PCP  May need to be on prophyllactic agent.  Pt aware of high purine foods, denies ETOH  2.  Chronic low back pain do not think the association with BM and urination is related to stenosis since last MRI mild and improved compared to prior.  Pt will discuss with PCP, may need UA  RTC 3 mo.  Discussed exercise program

## 2013-02-13 ENCOUNTER — Encounter: Payer: Self-pay | Admitting: Internal Medicine

## 2013-02-13 ENCOUNTER — Ambulatory Visit (INDEPENDENT_AMBULATORY_CARE_PROVIDER_SITE_OTHER): Payer: Medicare Other | Admitting: Internal Medicine

## 2013-02-13 ENCOUNTER — Ambulatory Visit (HOSPITAL_COMMUNITY)
Admission: RE | Admit: 2013-02-13 | Discharge: 2013-02-13 | Disposition: A | Discharge status: Home / Self Care | Payer: Medicare Other | Source: Ambulatory Visit | Attending: Internal Medicine | Admitting: Internal Medicine

## 2013-02-13 VITALS — BP 137/87 | HR 64 | Temp 97.4°F | Ht 74.0 in | Wt 333.3 lb

## 2013-02-13 DIAGNOSIS — M79672 Pain in left foot: Secondary | ICD-10-CM

## 2013-02-13 DIAGNOSIS — M545 Low back pain, unspecified: Secondary | ICD-10-CM

## 2013-02-13 DIAGNOSIS — H9190 Unspecified hearing loss, unspecified ear: Secondary | ICD-10-CM

## 2013-02-13 DIAGNOSIS — E785 Hyperlipidemia, unspecified: Secondary | ICD-10-CM

## 2013-02-13 DIAGNOSIS — M79609 Pain in unspecified limb: Secondary | ICD-10-CM

## 2013-02-13 DIAGNOSIS — M19079 Primary osteoarthritis, unspecified ankle and foot: Secondary | ICD-10-CM | POA: Insufficient documentation

## 2013-02-13 DIAGNOSIS — H9191 Unspecified hearing loss, right ear: Secondary | ICD-10-CM | POA: Insufficient documentation

## 2013-02-13 DIAGNOSIS — I10 Essential (primary) hypertension: Secondary | ICD-10-CM

## 2013-02-13 LAB — CBC WITH DIFFERENTIAL/PLATELET
BASOS ABS: 0 10*3/uL (ref 0.0–0.1)
Basophils Relative: 0 % (ref 0–1)
Eosinophils Absolute: 0.4 10*3/uL (ref 0.0–0.7)
Eosinophils Relative: 5 % (ref 0–5)
HEMATOCRIT: 47.1 % (ref 39.0–52.0)
HEMOGLOBIN: 16.9 g/dL (ref 13.0–17.0)
LYMPHS ABS: 2.9 10*3/uL (ref 0.7–4.0)
LYMPHS PCT: 36 % (ref 12–46)
MCH: 30.2 pg (ref 26.0–34.0)
MCHC: 35.9 g/dL (ref 30.0–36.0)
MCV: 84.1 fL (ref 78.0–100.0)
MONO ABS: 0.7 10*3/uL (ref 0.1–1.0)
MONOS PCT: 9 % (ref 3–12)
NEUTROS ABS: 4 10*3/uL (ref 1.7–7.7)
Neutrophils Relative %: 50 % (ref 43–77)
Platelets: 223 10*3/uL (ref 150–400)
RBC: 5.6 MIL/uL (ref 4.22–5.81)
RDW: 14.2 % (ref 11.5–15.5)
WBC: 8.1 10*3/uL (ref 4.0–10.5)

## 2013-02-13 LAB — LIPID PANEL
Cholesterol: 144 mg/dL (ref 0–200)
HDL: 42 mg/dL (ref >39)
LDL CALC: 81 mg/dL (ref 0–99)
Total CHOL/HDL Ratio: 3.4 Ratio
Triglycerides: 105 mg/dL (ref <150)
VLDL: 21 mg/dL (ref 0–40)

## 2013-02-13 LAB — COMPLETE METABOLIC PANEL WITH GFR
ALBUMIN: 4.3 g/dL (ref 3.5–5.2)
ALT: 44 U/L (ref 0–53)
AST: 39 U/L — ABNORMAL HIGH (ref 0–37)
Alkaline Phosphatase: 86 U/L (ref 39–117)
BUN: 10 mg/dL (ref 6–23)
CALCIUM: 9.3 mg/dL (ref 8.4–10.5)
CHLORIDE: 103 meq/L (ref 96–112)
CO2: 26 meq/L (ref 19–32)
Creat: 0.92 mg/dL (ref 0.50–1.35)
GLUCOSE: 82 mg/dL (ref 70–99)
Potassium: 3.9 mEq/L (ref 3.5–5.3)
SODIUM: 142 meq/L (ref 135–145)
TOTAL PROTEIN: 6.8 g/dL (ref 6.0–8.3)
Total Bilirubin: 0.9 mg/dL (ref 0.2–1.2)

## 2013-02-13 NOTE — Progress Notes (Signed)
   Subjective:    Patient ID: Eduardo Duncan, male    DOB: 1961-01-03, 53 y.o.   MRN: [id number]  HPI This is the first visit to my clinic for [name], who was previously followed in our clinic by Dr. Obie Dredge.  Patient is followed in the pain clinic by Dr. Letta Pate, whom he saw yesterday.  He has chronic back pain for which he takes ibuprofen; he reports taking 600 mg three to four times a day as needed for the pain.  He reports recent pain on the dorsum of his left foot with some associated swelling which began about 4 days ago and which he thought might be gout; he has had similar pain in the right foot attributed in the past to gout, and reports that he has had a high uric acid level in the past but has never had a joint aspiration.  He has not yet filled the naproxen prescribed by Dr. Letta Pate for presumed gout; he reports some improvement in the foot pain today.    I reviewed and updated medications, allergies, past medical history, family history, and social history.  Review of Systems  Constitutional: Negative for fever and chills.  Respiratory: Negative for cough, shortness of breath and wheezing.   Cardiovascular: Negative for chest pain.  Gastrointestinal: Negative for nausea, vomiting, abdominal pain, blood in stool and anal bleeding.  Genitourinary: Negative for dysuria, frequency and difficulty urinating.  Musculoskeletal: Positive for back pain (Chronic, non-radiating).  Neurological: Positive for numbness (Chronic in toes, left hand). Negative for dizziness, syncope and weakness.  Psychiatric/Behavioral: Negative for suicidal ideas and hallucinations.       Objective:   Physical Exam  HENT:  Head: Normocephalic.  Right Ear: Tympanic membrane, external ear and ear canal normal. Decreased hearing (Could hear finger rub bilaterally but says it was decreased on the right) is noted.  Left Ear: Tympanic membrane, external ear and ear canal normal. No decreased hearing is  noted.  Cardiovascular: Normal rate and regular rhythm.  Exam reveals no gallop and no friction rub.   No murmur heard. No edema  Pulmonary/Chest: Effort normal and breath sounds normal. No respiratory distress. He has no wheezes. He has no rales.  Abdominal: Soft. Bowel sounds are normal. He exhibits no distension. There is no tenderness. There is no rebound and no guarding.  Musculoskeletal:       Feet:  Neurological:  Straight leg raise negative bilaterally.  Gait normal.        Assessment & Plan:

## 2013-02-13 NOTE — Assessment & Plan Note (Signed)
BP Readings from Last 3 Encounters:  02/13/13 137/87  02/12/13 162/84  12/25/12 153/93    Lab Results  Component Value Date   NA 140 05/12/2012   K 4.3 05/12/2012   CREATININE 1.10 05/12/2012    Assessment: Blood pressure control: controlled Progress toward BP goal:  at goal Comments: Blood pressure is controlled on carvedilol 12.5 mg twice a day and losartan 25 mg daily  Plan: Medications:  continue current medications Educational resources provided: brochure Self management tools provided: home blood pressure logbook Other plans: Check a metabolic panel today

## 2013-02-13 NOTE — Assessment & Plan Note (Signed)
Assessment: Patient has chronic low back pain, lumbar spinal stenosis, and DISH; he is followed by Dr. Letta Pate in the pain clinic and says that he takes acetaminophen and ibuprofen as needed for the pain.  He does report taking ibuprofen 600 mg 3 and sometimes 4 times a day as needed.  I discussed with him the potential side effects of long-term ibuprofen use, and advised that he try to limit the quantity as much as possible while maintaining pain control.  Patient reports some recent worsening of his back pain.    Plan: Will check a CBC with differential and urinalysis today.  Patient will continue his current pain medication regimen as per Dr. Letta Pate and will followup with Dr. Letta Pate since as scheduled.

## 2013-02-13 NOTE — Assessment & Plan Note (Signed)
Lipids:    Component Value Date/Time   CHOL 126 05/12/2012 1549   TRIG 117 05/12/2012 1549   HDL 35* 05/12/2012 1549   LDLCALC 68 05/12/2012 1549   VLDL 23 05/12/2012 1549   CHOLHDL 3.6 05/12/2012 1549   Assessment: Patient is doing well on simvastatin 40 mg daily.  Plan: Will check a lipid panel today (patient says he is fasting), and continue simvastatin 40 mg daily pending that result.

## 2013-02-13 NOTE — Patient Instructions (Addendum)
General Instructions: A referral has been made for a hearing test. Return if your foot pain worsens or persists more than 2 weeks. Do not take ibuprofen while you are taking naproxen.   Progress Toward Treatment Goals:  Treatment Goal 02/13/2013  Blood pressure at goal    Self Care Goals & Plans:  Self Care Goal 02/13/2013  Manage my medications take my medicines as prescribed; bring my medications to every visit; refill my medications on time  Monitor my health keep track of my weight  Eat healthy foods eat foods that are low in salt; eat baked foods instead of fried foods; drink diet soda or water instead of juice or soda  Be physically active find an activity I enjoy  Meeting treatment goals maintain the current self-care plan      Care Management & Community Referrals:  Referral 02/13/2013  Referrals made for care management support none needed  Referrals made to community resources none

## 2013-02-13 NOTE — Assessment & Plan Note (Signed)
Assessment: Patient reports some decrease in the hearing in his right ear compared to the left.  Plan: Refer for audiology evaluation.

## 2013-02-13 NOTE — Assessment & Plan Note (Addendum)
Dg Foot Complete Left 02/13/2013   CLINICAL DATA:  Left foot pain and swelling.  EXAM: LEFT FOOT - COMPLETE 3+ VIEW  COMPARISON:  None.  FINDINGS: No acute fracture or dislocation is identified. Mild osteoarthritis noted of the first digit. Calcaneal spurs are identified. There is no evidence of erosive arthropathy, bony lesion or soft tissue abnormality.  IMPRESSION: Mild osteoarthritis of the first toe.   Electronically Signed   By: Aletta Edouard M.D.   On: 02/13/2013 13:16    Assessment: Patient reports a history of episodic foot pain attributed to gout, but also reports no prior joint aspiration to establish the diagnosis.  Today his presentation does not suggest gout; he has focal tenderness on the dorsum of his left foot but no signs of acute inflammation.  X-ray of the foot is notable for mild osteoarthritis as above.  Patient also says that the foot has been subject to repetitive stress during his workouts recently.  Plan: Dr. Letta Pate prescribed naproxen, and patient plans to get this filled today.  I advised him not to take ibuprofen along with the naproxen, and that given his improvement since yesterday he may do well with twice a day dosing of the naproxen.  I advised him to avoid stressing the foot during his workouts.  I advised him to return if his symptoms worsen or do not resolve within 2 weeks.  I also advised that he come in for evaluation in the future if he has any signs of inflammation in either foot.

## 2013-02-14 LAB — URINALYSIS, ROUTINE W REFLEX MICROSCOPIC
BILIRUBIN URINE: NEGATIVE
Glucose, UA: NEGATIVE mg/dL
Hgb urine dipstick: NEGATIVE
Ketones, ur: NEGATIVE mg/dL
Leukocytes, UA: NEGATIVE
Nitrite: NEGATIVE
PH: 5 (ref 5.0–8.0)
Protein, ur: NEGATIVE mg/dL
SPECIFIC GRAVITY, URINE: 1.021 (ref 1.005–1.030)
UROBILINOGEN UA: 0.2 mg/dL (ref 0.0–1.0)

## 2013-03-07 ENCOUNTER — Ambulatory Visit: Payer: Medicare Other | Admitting: Internal Medicine

## 2013-03-20 ENCOUNTER — Ambulatory Visit (INDEPENDENT_AMBULATORY_CARE_PROVIDER_SITE_OTHER): Payer: Medicare Other | Admitting: Internal Medicine

## 2013-03-20 ENCOUNTER — Encounter: Payer: Self-pay | Admitting: Internal Medicine

## 2013-03-20 VITALS — BP 133/85 | HR 76 | Temp 97.8°F | Ht 74.0 in | Wt 332.7 lb

## 2013-03-20 DIAGNOSIS — R509 Fever, unspecified: Secondary | ICD-10-CM

## 2013-03-20 LAB — CBC WITH DIFFERENTIAL/PLATELET
BASOS ABS: 0 10*3/uL (ref 0.0–0.1)
Basophils Relative: 0 % (ref 0–1)
EOS ABS: 0.3 10*3/uL (ref 0.0–0.7)
Eosinophils Relative: 3 % (ref 0–5)
HCT: 47.2 % (ref 39.0–52.0)
Hemoglobin: 16.7 g/dL (ref 13.0–17.0)
LYMPHS ABS: 2.1 10*3/uL (ref 0.7–4.0)
Lymphocytes Relative: 19 % (ref 12–46)
MCH: 30.4 pg (ref 26.0–34.0)
MCHC: 35.4 g/dL (ref 30.0–36.0)
MCV: 85.8 fL (ref 78.0–100.0)
Monocytes Absolute: 1 10*3/uL (ref 0.1–1.0)
Monocytes Relative: 9 % (ref 3–12)
NEUTROS PCT: 69 % (ref 43–77)
Neutro Abs: 7.8 10*3/uL — ABNORMAL HIGH (ref 1.7–7.7)
Platelets: 216 10*3/uL (ref 150–400)
RBC: 5.5 MIL/uL (ref 4.22–5.81)
RDW: 14.3 % (ref 11.5–15.5)
WBC: 11.3 10*3/uL — AB (ref 4.0–10.5)

## 2013-03-20 LAB — HEPATIC FUNCTION PANEL
ALK PHOS: 82 U/L (ref 39–117)
ALT: 17 U/L (ref 0–53)
AST: 17 U/L (ref 0–37)
Albumin: 3.9 g/dL (ref 3.5–5.2)
BILIRUBIN DIRECT: 0.2 mg/dL (ref 0.0–0.3)
BILIRUBIN INDIRECT: 0.9 mg/dL (ref 0.2–1.2)
Total Bilirubin: 1.1 mg/dL (ref 0.2–1.2)
Total Protein: 6.8 g/dL (ref 6.0–8.3)

## 2013-03-20 LAB — COMPREHENSIVE METABOLIC PANEL
ALK PHOS: 83 U/L (ref 39–117)
ALT: 17 U/L (ref 0–53)
AST: 16 U/L (ref 0–37)
Albumin: 3.9 g/dL (ref 3.5–5.2)
BILIRUBIN TOTAL: 1.1 mg/dL (ref 0.2–1.2)
BUN: 12 mg/dL (ref 6–23)
CO2: 27 meq/L (ref 19–32)
CREATININE: 0.97 mg/dL (ref 0.50–1.35)
Calcium: 9 mg/dL (ref 8.4–10.5)
Chloride: 101 mEq/L (ref 96–112)
Glucose, Bld: 99 mg/dL (ref 70–99)
Potassium: 4.1 mEq/L (ref 3.5–5.3)
SODIUM: 140 meq/L (ref 135–145)
Total Protein: 6.8 g/dL (ref 6.0–8.3)

## 2013-03-20 LAB — SEDIMENTATION RATE: SED RATE: 11 mm/h (ref 0–16)

## 2013-03-20 NOTE — Progress Notes (Signed)
   Subjective:    Patient ID: Eduardo Duncan, male    DOB: June 26, 1960, 53 y.o.   MRN: [id number]  HPI  Pt presents today with complaint of fever (T101 at home) and chills x 2 days. Afebrile in clinic with dipstick negative for nitrates and leukocytes. Denies sick contacts, recent travel, or new medications, sexual activity over > 1 year ago  Review of Systems  Constitutional: Positive for fever.  HENT: Negative.   Respiratory: Negative.   Cardiovascular: Negative.   Endocrine: Negative.   Genitourinary: Positive for dysuria.  Musculoskeletal: Positive for back pain.  Skin: Negative.   Allergic/Immunologic: Negative.   Neurological: Negative.   Hematological: Negative.   Psychiatric/Behavioral: Negative.        Objective:   Physical Exam  Constitutional: He is oriented to person, place, and time. He appears well-developed and well-nourished. No distress.  HENT:  Head: Normocephalic and atraumatic.  Eyes: Conjunctivae and EOM are normal. Pupils are equal, round, and reactive to light.  Cardiovascular: Normal rate, regular rhythm and normal heart sounds.   Pulmonary/Chest: Effort normal and breath sounds normal.  Abdominal: Soft.  Musculoskeletal: He exhibits no edema.  Neurological: He is alert and oriented to person, place, and time.  Skin: Skin is warm and dry.          Assessment & Plan:  See separate problem list charting:

## 2013-03-20 NOTE — Patient Instructions (Addendum)
It is unclear what may be causing your fever and chills. We will check your blood and urine for infection and contact you if there is need for treatment.

## 2013-03-20 NOTE — Assessment & Plan Note (Signed)
Unclear etiology, likely viral. Will get basic lab work including CBC, ESR, HIV, CMET 

## 2013-03-21 LAB — URINALYSIS, ROUTINE W REFLEX MICROSCOPIC
Bilirubin Urine: NEGATIVE
Glucose, UA: NEGATIVE mg/dL
Hgb urine dipstick: NEGATIVE
KETONES UR: NEGATIVE mg/dL
Leukocytes, UA: NEGATIVE
NITRITE: NEGATIVE
Protein, ur: NEGATIVE mg/dL
SPECIFIC GRAVITY, URINE: 1.029 (ref 1.005–1.030)
UROBILINOGEN UA: 0.2 mg/dL (ref 0.0–1.0)
pH: 5.5 (ref 5.0–8.0)

## 2013-03-21 LAB — HIV ANTIBODY (ROUTINE TESTING W REFLEX): HIV: NONREACTIVE

## 2013-04-03 ENCOUNTER — Ambulatory Visit: Payer: Medicare Other | Attending: Audiology | Admitting: Audiology

## 2013-04-03 DIAGNOSIS — H9191 Unspecified hearing loss, right ear: Secondary | ICD-10-CM

## 2013-04-03 DIAGNOSIS — H918X9 Other specified hearing loss, unspecified ear: Secondary | ICD-10-CM | POA: Insufficient documentation

## 2013-04-03 DIAGNOSIS — R6889 Other general symptoms and signs: Secondary | ICD-10-CM | POA: Diagnosis not present

## 2013-04-03 DIAGNOSIS — H906 Mixed conductive and sensorineural hearing loss, bilateral: Secondary | ICD-10-CM

## 2013-04-03 DIAGNOSIS — H9190 Unspecified hearing loss, unspecified ear: Secondary | ICD-10-CM | POA: Diagnosis present

## 2013-04-03 NOTE — Procedures (Signed)
[facility] [address] [place], [place] [address] [place] EVALUATION  Name: Eduardo Duncan DOB:  September 29, 1960 MRN:  [id number]                                Diagnosis: Decreased hearing of right ear Date: 04/03/2013    Referent: Axel Filler, MD  HISTORY: LAYMON STOCKERT, age 53 y.o. years, was seen for an audiological evaluation. He states that he recently noticed that he could "not hear as well out of his right ear when listening to music" but denies having difficulty with word understanding.  He thinks that the right ear hearing loss has been gradual but he also notices a feeling os "pressure in the right ear", "ringing" and "ear pain". On a scale of 1 (no impact) to 10 (ruined) Mr Witzke rate the effect that the tinnitus has on his life as a "1".   Ardian has expereiced balance issues for the past few years, but as a younger man "was very athletic".  HENDRIK DONATH reports having "concussions" while playing sports, but none recently.   Ronnie Derby reports noise exposure from the [employer] and hobbies, but reports "wearing hearing protection".           EVALUATION: Pure tone air and tone conduction was completed using conventional audiometry with inserts. Hearing thresholds are symmetrical from 250Hz  -4000Hz  and are 20 dBHL at 250-500Hz ; 10-15 dBHL at 1000Hz ; 25 dBHL at 2000Hz ; 35-40 dBHL at 4000Hz .  From 6000Hz  - 8000Hz  hearing loss in the right ear becomes poorer and is 50 dBHL at 6000hz  and 65 dBHL at 8000Hz .  In the left ear hearing thresholds are 10 dBHL at 6000Hz  and 30 dBHL at 8000Hz .  Speech reception thresholds are  15 dBHL in the right ear and 15 dBHL in the left ear using recorded spondee words.  The reliability is good. Word recognition is 100% at 55dBHL in the right and 100% at 55dBHL in the left using recorded NU-6 word lists in quiet.  In minimal background noise with +5dB signal to noise ratio word recogntion is  64% in the right ear and 86% in the left ear.  Otoscopic inspection reveals clear ear canals with visible tympanic membranes.  Tympanometry was had an unusual and abnormal configuration in the right ear.  The left ear was within normal limits (Type A).  Ipsilateral acoustic reflexes are elevated and absent on the right side and are present to elevated on the left side.  Retrocochlear testing appears negative in each ear for the tone decay test.  Acoustic reflex decay is negative in the left ear, but was inconclusive because of the abnormal tympanometry in the right ear.   CONCLUSION:      DEWIGHT CATINO an asymmetrical hearing loss that is worse in the right ear high frequencies. There is a mixed hearing loss component bilaterally in the low frequencies with a sensorineural hearing loss component in the high frequencies.  Low frequency hearing is within normal limits bilaterally with a moderate sloping to moderately severe high frequency hearing loss on the right side and normal to borderline mild high frequency hearing loss on the left side.  Mr. Wery has excellent word recognition in quiet in each ear.  In minimal background noise the right ear word recognition drops to poor-which could be explained by the hearing loss.  The left ear continues to have  good word recognition in minimal background noise.   Although an asymmetrical hearing loss needs to be closely monitored to rule out retrocochlear pathology, no retrocochlear tests were positive today.  As discussed with Mr. Gilliam, further evaluation by an ENT is recommended because of the unusual middle ear function testing, elevated acoustic reflexes and high frequency hearing loss on the right side.  It will also be important to mention the history of balance issues and tinnitus that Mr. Ormond reports.  RECOMMENDATIONS: 1.   Further evaluation of the tinnitus by an Ear, Nose and Throat physician for right sided abnormal tympanometry,  elevated  acoustic reflexes and high frequency hearing loss with tinnitus and reported balance issues.  2.   Monitor hearing closely with a repeat audiological evaluation in 2-3 months (earlier if there is any change in hearing or ear pressure). This test may be scheduled here or at the ENT office.  In conclusion, only after medical clearance should Mr. Mezera consider amplification because medical intervention may improve his hearing.  Deborah L. Heide Spark, Au.D., CCC-A Doctor of Audiology  04/03/2013  cc: Axel Filler, MD

## 2013-04-06 ENCOUNTER — Other Ambulatory Visit: Payer: Self-pay | Admitting: Internal Medicine

## 2013-04-06 DIAGNOSIS — H9191 Unspecified hearing loss, right ear: Secondary | ICD-10-CM

## 2013-04-06 NOTE — Progress Notes (Signed)
Audiometry was done and the audiologist recommended further evaluation by ENT physician for right sided abnormal tympanometry, elevated acoustic reflexes and high frequency hearing loss with tinnitus and reported balance issues. Plan is refer to ENT.

## 2013-04-12 ENCOUNTER — Telehealth: Payer: Self-pay | Admitting: *Deleted

## 2013-04-12 NOTE — Telephone Encounter (Signed)
CALLED AND LEFT VOICE MESSAGE FOR PATIENT WITH APPOINTMENT TO ENT: DR Radene Journey April 9, 015 @ 1:30PM / ARRIVE 1:15 PM.  [place] / [place].  / Kayce Betty NTII

## 2013-05-04 ENCOUNTER — Ambulatory Visit (HOSPITAL_BASED_OUTPATIENT_CLINIC_OR_DEPARTMENT_OTHER): Payer: Medicare Other | Admitting: Physical Medicine & Rehabilitation

## 2013-05-04 ENCOUNTER — Encounter: Payer: Self-pay | Admitting: Physical Medicine & Rehabilitation

## 2013-05-04 ENCOUNTER — Encounter: Payer: Medicare Other | Attending: Physical Medicine & Rehabilitation

## 2013-05-04 VITALS — BP 146/78 | HR 75 | Resp 14 | Ht 74.0 in | Wt 344.8 lb

## 2013-05-04 DIAGNOSIS — M810 Age-related osteoporosis without current pathological fracture: Secondary | ICD-10-CM | POA: Insufficient documentation

## 2013-05-04 DIAGNOSIS — M545 Low back pain, unspecified: Secondary | ICD-10-CM

## 2013-05-04 DIAGNOSIS — G8929 Other chronic pain: Secondary | ICD-10-CM

## 2013-05-04 DIAGNOSIS — M542 Cervicalgia: Secondary | ICD-10-CM

## 2013-05-04 DIAGNOSIS — K4090 Unilateral inguinal hernia, without obstruction or gangrene, not specified as recurrent: Secondary | ICD-10-CM | POA: Insufficient documentation

## 2013-05-04 DIAGNOSIS — M48061 Spinal stenosis, lumbar region without neurogenic claudication: Secondary | ICD-10-CM | POA: Insufficient documentation

## 2013-05-04 DIAGNOSIS — IMO0002 Reserved for concepts with insufficient information to code with codable children: Secondary | ICD-10-CM | POA: Insufficient documentation

## 2013-05-04 DIAGNOSIS — I10 Essential (primary) hypertension: Secondary | ICD-10-CM | POA: Insufficient documentation

## 2013-05-04 NOTE — Patient Instructions (Signed)
Please call if you've develop weakness in the arms increase balance problems increased neck pain

## 2013-05-04 NOTE — Progress Notes (Signed)
Subjective:    Patient ID: Eduardo Duncan, male    DOB: 1960/05/14, 53 y.o.   MRN: [id number]  HPI Left foot pain without trauma, hx of gout which affected dorsum of R foot in the past. In April 2010 urate was 10.7. Had Xray 2003 for dorsum of Left foot swelling. Pain better with non wt bearing  Evaluated by Dr Jannifer Franklin normal brain MRI.  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 General activity 5 Relation with others 6 Enjoyment of life 6 What TIME of day is your pain at its worst? all Sleep (in general) Poor  Pain is worse with: walking, bending, standing and some activites Pain improves with: heat/ice Relief from Meds: 6  Mobility walk without assistance how many minutes can you walk? 10 ability to climb steps?  yes do you drive?  yes  Function disabled: date disabled .  Neuro/Psych weakness numbness tingling spasms  Prior Studies Any changes since last visit?  no  Physicians involved in your care Any changes since last visit?  no   Family History  Problem Relation Age of Onset  . Diabetes Mother   . Hypertension Mother   . Alzheimer's disease Mother   . Heart disease Mother   . Heart disease Father   . Colon cancer Brother 32  . Coronary artery disease Brother 89  . Stroke Brother   . Cancer Maternal Grandmother   . Ovarian cancer Paternal Grandmother    History   Social History  . Marital Status: Single    Spouse Name: N/A    Number of Children: 0  . Years of Education: trade   Occupational History  .  disability      10/2006--has not worked since 2004   Social History Main Topics  . Smoking status: Former Smoker -- 1.00 packs/day for 12 years    Types: Cigarettes    Quit date: 03/13/1986  . Smokeless tobacco: Never Used  . 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 Drug Use: No  . Sexual Activity: None   Other Topics Concern  . None   Social History Narrative   Registration notes: Red bag given 05/10/08.         Past Surgical History  Procedure Laterality Date  . Cholecystectomy  07/2009    Lap  . Colonoscopy  01/2009  . Cholecystectomy  07/11/2009   Past Medical History  Diagnosis Date  . Allergy   . Dizziness   . Osteoporosis   . Hypertension   . Depression   . GERD (gastroesophageal reflux disease)   . Hyperlipidemia   . Polycythemia rubra vera   . Obstructive sleep apnea     On CPAP  . Spinal stenosis of lumbar region   . Arthritis   . Asthma   . Cervicogenic headache 12/25/2012  . Obesity    BP 146/78  Pulse 75  Resp 14  Ht 6\' 2"  (1.88 m)  Wt  344 lb 12.8 oz (156.4 kg)  BMI 44.25 kg/m2  SpO2 97%  Opioid Risk Score:   Fall Risk Score: Low Fall Risk (0-5 points) (educated and handout declined at previous visit.)  Review of Systems  Constitutional: Positive for unexpected weight change.  Musculoskeletal: Positive for back pain.       Spasms  Neurological: Positive for weakness and numbness.       Tingling  All other systems reviewed and are negative.      Objective:   Physical Exam  Nursing note and vitals reviewed. Constitutional: He appears well-developed and well-nourished.  HENT:  Head: Normocephalic and atraumatic.  Eyes: Conjunctivae and EOM are normal. Pupils are equal, round, and reactive to light.  Musculoskeletal:       Cervical back: He exhibits decreased range of motion. He exhibits no tenderness and no deformity.       Thoracic back: He exhibits decreased range of motion. He  exhibits no tenderness.       Lumbar back: He exhibits decreased range of motion. He exhibits no tenderness.  Psychiatric: He has a normal mood and affect.   Negative straight leg Strength is 5/5 bilateral deltoid, bicep, tricep, grip, hip flexion, knee extensors, ankle DF/PF       Assessment & Plan:  1. Left dorsum foot swelling resolved apparently was better on nonsteroidal anti-inflammatories by the time he needed to his primary physician days later 2. Chronic low back pain states he backed up on his exercise program related to increase pain, his exam has not changed at all I don't see any need for repeat imaging Have encouraged the patient to resume his exercise program, I believe his weight gain of 40 pounds since last summer is playing a role in his current symptoms

## 2013-05-14 ENCOUNTER — Ambulatory Visit: Payer: Medicare Other | Admitting: Physical Medicine & Rehabilitation

## 2013-05-16 ENCOUNTER — Ambulatory Visit (INDEPENDENT_AMBULATORY_CARE_PROVIDER_SITE_OTHER): Payer: Medicare Other | Admitting: Internal Medicine

## 2013-05-16 ENCOUNTER — Encounter: Payer: Self-pay | Admitting: Internal Medicine

## 2013-05-16 VITALS — BP 126/79 | HR 74 | Temp 97.4°F | Ht 74.0 in | Wt 346.7 lb

## 2013-05-16 DIAGNOSIS — G4733 Obstructive sleep apnea (adult) (pediatric): Secondary | ICD-10-CM

## 2013-05-16 DIAGNOSIS — I10 Essential (primary) hypertension: Secondary | ICD-10-CM

## 2013-05-16 DIAGNOSIS — J45909 Unspecified asthma, uncomplicated: Secondary | ICD-10-CM

## 2013-05-16 DIAGNOSIS — E785 Hyperlipidemia, unspecified: Secondary | ICD-10-CM

## 2013-05-16 MED ORDER — SIMVASTATIN 40 MG PO TABS: 40.0000 mg | ORAL_TABLET | Freq: Every day | ORAL | Status: DC

## 2013-05-16 MED ORDER — ALBUTEROL SULFATE HFA 108 (90 BASE) MCG/ACT IN AERS: 2.0000 | INHALATION_SPRAY | Freq: Four times a day (QID) | RESPIRATORY_TRACT | Status: DC | PRN

## 2013-05-16 MED ORDER — LOSARTAN POTASSIUM 25 MG PO TABS: 25.0000 mg | ORAL_TABLET | Freq: Every day | ORAL | Status: DC

## 2013-05-16 MED ORDER — FAMOTIDINE 20 MG PO TABS: 10.0000 mg | ORAL_TABLET | Freq: Two times a day (BID) | ORAL | Status: DC

## 2013-05-16 NOTE — Progress Notes (Signed)
   Subjective:    Patient ID: Eduardo Duncan, male    DOB: [birth year], 53 y.o.   MRN: [id number]  HPI Patient returns for management of his hypertension, obstructive sleep apnea, asthma, and other chronic medical problems.  His main complaint today is ongoing chronic pain, which is managed by Dr. Letta Pate in the pain clinic here.   Patient reports that he is compliant with his medications.  He requested refills on his medications.   Review of Systems  Constitutional: Negative for fever, chills and diaphoresis.  Respiratory: Negative for shortness of breath.   Cardiovascular: Negative for chest pain and leg swelling.  Gastrointestinal: Negative for nausea, vomiting and abdominal pain.  Genitourinary: Negative for dysuria.       Objective:   Physical Exam  Constitutional: No distress.  Cardiovascular: Normal rate, regular rhythm and normal heart sounds.  Exam reveals no gallop.   No murmur heard. Pulmonary/Chest: Effort normal and breath sounds normal. No respiratory distress. He has no wheezes. He has no rales.  Musculoskeletal: He exhibits no edema.       Assessment & Plan:

## 2013-05-16 NOTE — Patient Instructions (Signed)
General Instructions: Continue current medications.   Progress Toward Treatment Goals:  Treatment Goal 05/16/2013  Blood pressure at goal    Self Care Goals & Plans:  Self Care Goal 05/16/2013  Manage my medications -  Monitor my health -  Eat healthy foods -  Be physically active -  Meeting treatment goals maintain the current self-care plan    No flowsheet data found.   Care Management & Community Referrals:  Referral 05/16/2013  Referrals made for care management support none needed  Referrals made to community resources none

## 2013-05-16 NOTE — Assessment & Plan Note (Signed)
Assessment: Patient reports that he is compliant with his CPAP, and appears to have no daytime symptoms.  Plan: Continue CPAP

## 2013-05-16 NOTE — Assessment & Plan Note (Signed)
Assessment: Patient reports using his albuterol inhaler more frequently recently due to pollen aggravating his asthma.  He has no shortness of breath or wheezing today.  He is followed by Dr. Melvyn Novas, but has not seen him recently.  He has been intolerant of inhaled steroids in the past.  Plan: I refilled his albuterol inhaler, and advised that he schedule a followup appointment with Dr. Melvyn Novas

## 2013-05-16 NOTE — Assessment & Plan Note (Signed)
BP Readings from Last 3 Encounters:  05/16/13 126/79  05/04/13 146/78  03/20/13 133/85    Lab Results  Component Value Date   NA 140 03/20/2013   K 4.1 03/20/2013   CREATININE 0.97 03/20/2013    Assessment: Blood pressure control: controlled Progress toward BP goal:  at goal Comments: Blood pressure is controlled on carvedilol 12.5 mg twice a day and losartan 25 mg daily  Plan: Medications:  continue current medications

## 2013-05-16 NOTE — Assessment & Plan Note (Signed)
Lipids:    Component Value Date/Time   CHOL 144 02/13/2013 1126   TRIG 105 02/13/2013 1126   HDL 42 02/13/2013 1126   LDLCALC 81 02/13/2013 1126   VLDL 21 02/13/2013 1126   CHOLHDL 3.4 02/13/2013 1126   Assessment: Patient is doing well on simvastatin 40 mg daily; his LDL is well controlled.  Plan: Continue simvastatin 40 mg daily.

## 2013-06-07 ENCOUNTER — Other Ambulatory Visit: Payer: Self-pay | Admitting: *Deleted

## 2013-06-07 DIAGNOSIS — I10 Essential (primary) hypertension: Secondary | ICD-10-CM

## 2013-06-08 MED ORDER — CARVEDILOL 12.5 MG PO TABS: 12.5000 mg | ORAL_TABLET | Freq: Two times a day (BID) | ORAL | Status: DC

## 2013-07-21 ENCOUNTER — Other Ambulatory Visit: Payer: Self-pay | Admitting: Internal Medicine

## 2013-07-25 ENCOUNTER — Other Ambulatory Visit: Payer: Self-pay | Admitting: *Deleted

## 2013-07-25 NOTE — Telephone Encounter (Signed)
This was last filled 09/13/2007. Pt was called and states he did not request this med, cipro 500mg  bid

## 2013-10-26 ENCOUNTER — Other Ambulatory Visit: Payer: Self-pay

## 2013-11-02 ENCOUNTER — Ambulatory Visit: Payer: Medicare Other | Admitting: Physical Medicine & Rehabilitation

## 2013-11-19 ENCOUNTER — Ambulatory Visit (HOSPITAL_BASED_OUTPATIENT_CLINIC_OR_DEPARTMENT_OTHER): Payer: Medicare Other | Admitting: Physical Medicine & Rehabilitation

## 2013-11-19 ENCOUNTER — Encounter: Payer: Self-pay | Admitting: Physical Medicine & Rehabilitation

## 2013-11-19 ENCOUNTER — Encounter: Payer: Medicare Other | Attending: Physical Medicine & Rehabilitation

## 2013-11-19 VITALS — BP 164/76 | HR 76 | Resp 14 | Ht 73.5 in | Wt 362.0 lb

## 2013-11-19 DIAGNOSIS — M47814 Spondylosis without myelopathy or radiculopathy, thoracic region: Secondary | ICD-10-CM

## 2013-11-19 DIAGNOSIS — M4806 Spinal stenosis, lumbar region: Secondary | ICD-10-CM

## 2013-11-19 DIAGNOSIS — M4802 Spinal stenosis, cervical region: Secondary | ICD-10-CM | POA: Diagnosis not present

## 2013-11-19 DIAGNOSIS — M4814 Ankylosing hyperostosis [Forestier], thoracic region: Secondary | ICD-10-CM | POA: Insufficient documentation

## 2013-11-19 DIAGNOSIS — R200 Anesthesia of skin: Secondary | ICD-10-CM | POA: Insufficient documentation

## 2013-11-19 DIAGNOSIS — M48061 Spinal stenosis, lumbar region without neurogenic claudication: Secondary | ICD-10-CM

## 2013-11-19 NOTE — Patient Instructions (Signed)
Please call me if he like me to order MRI or CT scan. Also we discussed we can do EMG of the right upper extremity to evaluate numbness and hand. Previous EMG of the left hand was done by the neurologist and was normal.

## 2013-11-19 NOTE — Progress Notes (Signed)
Subjective:    Patient ID: Eduardo Duncan, male    DOB: 04/14/1960, 53 y.o.   MRN: [id number]  HPI   Well as lower limb numbness over last several months. Has difficulty going down steps. Starting to use elevator at the [facility] for the last 3 months. Workouts have been cut down to about a half hour rather than 1 hour. Weight gain of over 40 pounds in the last 6 months. No falls, no new trauma, has been stressed out related to the house he is renting which is about to be sold. Pain Inventory Average Pain 6 Pain Right Now 7 My pain is constant, sharp, tingling and aching  In the last 24 hours, has pain interfered with the following? General activity 6 Relation with others 6 Enjoyment of life 7 What TIME of day is your pain at its worst? all day long Sleep (in general) Poor  Pain is worse with: standing Pain improves with: rest, heat/ice and medication Relief from Meds: 5  Mobility walk without assistance how many minutes can you walk? 10 ability to climb steps?  yes do you drive?  yes  Function disabled: date disabled .  Neuro/Psych numbness tremor tingling trouble walking spasms  Prior Studies Any changes since last visit?  yes MRI lumbar spine 12/26/2010 IMPRESSION:   Since the prior examination, there has been a decrease in the degree of spinal stenosis at the L4-5 level and resolution of edema surrounding the left L5-S1 facet joint.   Slight change in signal intensity along the periphery of the L2-3, L3-4 and L4-5 disc may represent mineral/calcium deposition.   Remainder of findings are unchanged as detailed above.   Original Report Authenticated By: Doug Sou, M.D.   MRI cervical spine 01/20/2010 IMPRESSION:    1.  No change in congenital and acquired central canal stenosis at C3-4 where a central protrusion mildly deforms the ventral cord. 2.  No change in mild ventral cord deformity at C4-5. 3.  No new central canal stenosis or nerve root  compression.   MRI thoracic spine 08/29/2012 IMPRESSION: 1. 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 There are multiple paraspinal osteophytes. These findings are likely due to underlying diffuse idiopathic skeletal hyperostosis. The sacroiliac joints did not appear fused on the prior lumbar MRI, and ankylosing spondylitis is considered less likely. 2. Stable congenital spinal stenosis, primarily in the cervical region. 3. Stable mild superimposed spondylosis. No acute findings identified.     Electronically Signed   By: Camie Patience   On: 08/29/2012 13:50   Physicians involved in your care Any changes since last visit?  no   Family History  Problem Relation Age of Onset  . Diabetes Mother   . Hypertension Mother   . Alzheimer's disease Mother   . Heart disease Mother   . Heart disease Father   . Colon cancer Brother 102  . Coronary artery disease Brother 102  . Stroke Brother   . Cancer Maternal Grandmother   . Ovarian cancer Paternal Grandmother    History   Social History  . Marital Status: Single    Spouse Name: N/A    Number of Children: 0  . Years of Education: trade   Occupational History  . disability      10/2006--has not worked since 2004   Social History Main Topics  . Smoking status: Former Smoker -- 1.00 packs/day for 12 years    Types: Cigarettes    Quit date:  03/13/1986  . Smokeless tobacco: Never Used  . 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 Drug Use: No  . Sexual Activity: None   Other Topics Concern  . None   Social History Narrative   Registration notes: Red bag given 05/10/08.         Past Surgical History  Procedure Laterality Date  . Cholecystectomy  07/2009    Lap  . Colonoscopy  01/2009  . Cholecystectomy  07/11/2009   Past Medical History  Diagnosis Date  . Allergy   . Dizziness   . Osteoporosis   . Hypertension   . Depression   .  GERD (gastroesophageal reflux disease)   . Hyperlipidemia   . Polycythemia rubra vera   . Obstructive sleep apnea     On CPAP  . Spinal stenosis of lumbar region   . Arthritis   . Asthma   . Cervicogenic headache 12/25/2012  . Obesity    BP 164/76 mmHg  Pulse 76  Resp 14  Ht 6' 1.5" (1.867 m)  Wt 362 lb (164.202 kg)  BMI 47.11 kg/m2  SpO2 96%  Opioid Risk Score:   Fall Risk Score: Low Fall Risk (0-5 points)   Review of Systems  Constitutional: Negative.   HENT: Negative.   Eyes: Negative.   Respiratory: Negative.   Cardiovascular: Negative.   Gastrointestinal: Negative.   Endocrine: Negative.   Genitourinary: Positive for frequency. Negative for dysuria.  Musculoskeletal: Positive for myalgias and back pain.       Fine motor skills decreased recently  Skin: Negative.   Allergic/Immunologic: Negative.   Neurological: Positive for tremors and numbness.  Hematological: Negative.   Psychiatric/Behavioral: Negative.        Objective:   Physical Exam  Constitutional: He is oriented to person, place, and time. He appears well-developed.  HENT:  Head: Normocephalic and atraumatic.  Eyes: Conjunctivae and EOM are normal. Pupils are equal, round, and reactive to light.  Neurological: He is alert and oriented to person, place, and time. He displays no atrophy. Gait normal.  Reflex Scores:      Patellar reflexes are 1+ on the right side and 2+ on the left side.      Achilles reflexes are 2+ on the right side and 2+ on the left side. Sensation intact to pinprick L3-L4 L5-S1 Motor strength 4/5 bilateral quads As well as hip flexor 5/5 bilateral ankle dorsi flexor EHL  Psychiatric: He has a normal mood and affect.  Nursing note and vitals reviewed.  No evidence of clonus. Gait without evidence of ataxia       Assessment & Plan:  1. Lumbar spinal stenosis Mild to moderate, Symptomatically may be progressing. From exam standpoint mild proximal weakness although effort  may not be full. Sensation is intact. Deep tendon reflexes are mildly reduced only in the right patellar. We discussed imaging studies and he would not like to pursue these at the current time. This is for financial reasons. I don't think there is any emergent need for these studies at this point.  2. Thoracic diffuse idiopathic skeletal hyperostosis but no evidence of thoracic stenosis this is likely contributing to increasing stiffness in the back and reducing range of motion  3. Cervical stenosis primarily C3-C4 may have some upper extremity paresthesias but certainly no weakness. He continues to work out with weights mainly with his upper body.  We discussed if his symptoms progressed  he may consider neurology reevaluation. He would like to hold off on this at the current time

## 2014-02-18 ENCOUNTER — Encounter: Payer: Self-pay | Admitting: Physical Medicine & Rehabilitation

## 2014-02-18 ENCOUNTER — Encounter: Payer: Medicare Other | Attending: Physical Medicine & Rehabilitation

## 2014-02-18 ENCOUNTER — Ambulatory Visit (HOSPITAL_BASED_OUTPATIENT_CLINIC_OR_DEPARTMENT_OTHER): Payer: Medicare Other | Admitting: Physical Medicine & Rehabilitation

## 2014-02-18 VITALS — BP 155/83 | HR 88 | Resp 14

## 2014-02-18 DIAGNOSIS — M4806 Spinal stenosis, lumbar region: Secondary | ICD-10-CM

## 2014-02-18 DIAGNOSIS — M4802 Spinal stenosis, cervical region: Secondary | ICD-10-CM | POA: Diagnosis not present

## 2014-02-18 DIAGNOSIS — M4814 Ankylosing hyperostosis [Forestier], thoracic region: Secondary | ICD-10-CM | POA: Diagnosis not present

## 2014-02-18 DIAGNOSIS — M4810 Ankylosing hyperostosis [Forestier], site unspecified: Secondary | ICD-10-CM

## 2014-02-18 DIAGNOSIS — R200 Anesthesia of skin: Secondary | ICD-10-CM | POA: Insufficient documentation

## 2014-02-18 DIAGNOSIS — M48061 Spinal stenosis, lumbar region without neurogenic claudication: Secondary | ICD-10-CM

## 2014-02-18 MED ORDER — GABAPENTIN 100 MG PO CAPS: 100.0000 mg | ORAL_CAPSULE | Freq: Three times a day (TID) | ORAL | Status: DC

## 2014-02-18 NOTE — Progress Notes (Signed)
Subjective:    Patient ID: Eduardo Duncan, male    DOB: [birth year], 54 y.o.   MRN: [id number]  HPI Patient here with multiple complaints today. He has his chronic cervical thoracic and lumbar paraspinal discomfort but also complaining of intermittent paresthesias in both lower extremities. Patient has had several stressors recently. He is about to be evicted from his apartment since they're selling the building. He's had some credit issues.    Pain Inventory Average Pain 7 Pain Right Now 7 My pain is burning, tingling and aching  In the last 24 hours, has pain interfered with the following? General activity 7 Relation with others 7 Enjoyment of life 7 What TIME of day is your pain at its worst? all Sleep (in general) Poor  Pain is worse with: walking, standing and some activites Pain improves with: rest, heat/ice and medication Relief from Meds: 6  Mobility walk without assistance how many minutes can you walk? 10 ability to climb steps?  yes do you drive?  yes  Function disabled: date disabled . I need assistance with the following:  toileting  Neuro/Psych weakness numbness tremor tingling spasms depression anxiety  Prior Studies Any changes since last visit?  no  Physicians involved in your care Any changes since last visit?  no   Family History  Problem Relation Age of Onset  . Diabetes Mother   . Hypertension Mother   . Alzheimer's disease Mother   . Heart disease Mother   . Heart disease Father   . Colon cancer Brother 54  . Coronary artery disease Brother 62  . Stroke Brother   . Cancer Maternal Grandmother   . Ovarian cancer Paternal Grandmother    History   Social History  . Marital Status: Single    Spouse Name: N/A    Number of Children: 0  . Years of Education: trade   Occupational History  . disability      10/2006--has not worked since 2004   Social History Main Topics  . Smoking status: Former Smoker -- 1.00 packs/day for  12 years    Types: Cigarettes    Quit date: 03/13/1986  . Smokeless tobacco: Never Used  . 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 Drug Use: No  . Sexual Activity: None   Other Topics Concern  . None   Social History Narrative   Registration notes: Red bag given 05/10/08.         Past Surgical History  Procedure Laterality Date  . Cholecystectomy  07/2009    Lap  . Colonoscopy  01/2009  . Cholecystectomy  07/11/2009   Past Medical History  Diagnosis Date  . Allergy   . Dizziness   . Osteoporosis   . Hypertension   . Depression   . GERD (gastroesophageal reflux disease)   . Hyperlipidemia   . Polycythemia rubra vera   . Obstructive sleep apnea     On CPAP  . Spinal stenosis of lumbar region   . Arthritis   . Asthma   . Cervicogenic headache 12/25/2012  . Obesity    BP 155/83 mmHg  Pulse 88  Resp 14  SpO2 95%  Opioid Risk Score:   Fall Risk Score:    Review of Systems  Constitutional:       Weight gain  Respiratory: Positive for apnea, shortness of breath and wheezing.   Musculoskeletal: Positive for joint swelling.  Neurological:  Positive for tremors, weakness and numbness.       Tingling Spasms   Psychiatric/Behavioral: Positive for dysphoric mood. The patient is nervous/anxious.   All other systems reviewed and are negative.      Objective:   Physical Exam  Constitutional: He is oriented to person, place, and time. He appears well-developed and well-nourished.  HENT:  Head: Normocephalic and atraumatic.  Eyes: Conjunctivae are normal. Pupils are equal, round, and reactive to light.  Neck: Normal range of motion.  Neurological: He is alert and oriented to person, place, and time.  Reflex Scores:      Patellar reflexes are 2+ on the right side and 2+ on the left side.      Achilles reflexes are 1+ on the right side and 2+ on the left side. 5 minus bilateral quads 4/5 bilateral  hamstrings 5/bilateral TA  Psychiatric: He has a normal mood and affect.  Nursing note and vitals reviewed. Sensation is intact     Assessment & Plan:  1.  Cervical spinal stenosis 2. Thoracic spondylosis 3. Lumbar pain with possible radicular symptoms. He does have issues with lumbar spondylosis.  This may have progressed since last MRI performed in 2012. Examination does not show any clear-cut signs of radiculopathy. MRI made be helpful to further delineate however this is not an urgent situation.  4. Chronic pain syndrome he does have multifactorial pain also influenced by morbid obesity and decreased activity level, patient feels that once he gets his housing situation settled he may be old construct more on losing weight and exercising.

## 2014-03-13 ENCOUNTER — Telehealth: Payer: Self-pay | Admitting: Internal Medicine

## 2014-03-13 NOTE — Telephone Encounter (Signed)
Call to patient to confirm appointment for 03/14/14 at 10:15 lmtcb

## 2014-03-14 ENCOUNTER — Encounter: Payer: Self-pay | Admitting: Internal Medicine

## 2014-03-14 ENCOUNTER — Ambulatory Visit (INDEPENDENT_AMBULATORY_CARE_PROVIDER_SITE_OTHER): Payer: Medicare Other | Admitting: Internal Medicine

## 2014-03-14 VITALS — BP 152/103 | HR 84 | Temp 97.2°F | Wt 381.6 lb

## 2014-03-14 DIAGNOSIS — J302 Other seasonal allergic rhinitis: Secondary | ICD-10-CM

## 2014-03-14 DIAGNOSIS — G4733 Obstructive sleep apnea (adult) (pediatric): Secondary | ICD-10-CM | POA: Diagnosis not present

## 2014-03-14 DIAGNOSIS — E785 Hyperlipidemia, unspecified: Secondary | ICD-10-CM

## 2014-03-14 DIAGNOSIS — I10 Essential (primary) hypertension: Secondary | ICD-10-CM | POA: Diagnosis not present

## 2014-03-14 DIAGNOSIS — M48061 Spinal stenosis, lumbar region without neurogenic claudication: Secondary | ICD-10-CM

## 2014-03-14 DIAGNOSIS — J45909 Unspecified asthma, uncomplicated: Secondary | ICD-10-CM | POA: Diagnosis not present

## 2014-03-14 DIAGNOSIS — Z862 Personal history of diseases of the blood and blood-forming organs and certain disorders involving the immune mechanism: Secondary | ICD-10-CM

## 2014-03-14 DIAGNOSIS — M4806 Spinal stenosis, lumbar region: Secondary | ICD-10-CM

## 2014-03-14 LAB — COMPLETE METABOLIC PANEL WITH GFR
ALT: 41 U/L (ref 0–53)
AST: 31 U/L (ref 0–37)
Albumin: 3.9 g/dL (ref 3.5–5.2)
Alkaline Phosphatase: 83 U/L (ref 39–117)
BILIRUBIN TOTAL: 0.7 mg/dL (ref 0.2–1.2)
BUN: 14 mg/dL (ref 6–23)
CO2: 26 mEq/L (ref 19–32)
CREATININE: 1.09 mg/dL (ref 0.50–1.35)
Calcium: 9.4 mg/dL (ref 8.4–10.5)
Chloride: 104 mEq/L (ref 96–112)
GFR, EST NON AFRICAN AMERICAN: 76 mL/min
GFR, Est African American: 88 mL/min
GLUCOSE: 91 mg/dL (ref 70–99)
Potassium: 4.3 mEq/L (ref 3.5–5.3)
Sodium: 142 mEq/L (ref 135–145)
Total Protein: 6.8 g/dL (ref 6.0–8.3)

## 2014-03-14 LAB — CBC WITH DIFFERENTIAL/PLATELET
Basophils Absolute: 0.1 10*3/uL (ref 0.0–0.1)
Basophils Relative: 1 % (ref 0–1)
Eosinophils Absolute: 0.3 10*3/uL (ref 0.0–0.7)
Eosinophils Relative: 5 % (ref 0–5)
HEMATOCRIT: 48.7 % (ref 39.0–52.0)
HEMOGLOBIN: 16.4 g/dL (ref 13.0–17.0)
LYMPHS ABS: 2.4 10*3/uL (ref 0.7–4.0)
LYMPHS PCT: 37 % (ref 12–46)
MCH: 29.5 pg (ref 26.0–34.0)
MCHC: 33.7 g/dL (ref 30.0–36.0)
MCV: 87.6 fL (ref 78.0–100.0)
MPV: 10.2 fL (ref 8.6–12.4)
Monocytes Absolute: 0.5 10*3/uL (ref 0.1–1.0)
Monocytes Relative: 8 % (ref 3–12)
NEUTROS ABS: 3.2 10*3/uL (ref 1.7–7.7)
NEUTROS PCT: 49 % (ref 43–77)
Platelets: 220 10*3/uL (ref 150–400)
RBC: 5.56 MIL/uL (ref 4.22–5.81)
RDW: 15.1 % (ref 11.5–15.5)
WBC: 6.6 10*3/uL (ref 4.0–10.5)

## 2014-03-14 LAB — LIPID PANEL
CHOL/HDL RATIO: 4.1 ratio
CHOLESTEROL: 171 mg/dL (ref 0–200)
HDL: 42 mg/dL (ref >=40)
LDL Cholesterol: 84 mg/dL (ref 0–99)
Triglycerides: 223 mg/dL — ABNORMAL HIGH (ref <150)
VLDL: 45 mg/dL — ABNORMAL HIGH (ref 0–40)

## 2014-03-14 MED ORDER — LOSARTAN POTASSIUM 25 MG PO TABS: 50.0000 mg | ORAL_TABLET | Freq: Every day | ORAL | Status: DC

## 2014-03-14 NOTE — Progress Notes (Signed)
   Subjective:    Patient ID: Eduardo Duncan, male    DOB: 09/02/1960, 54 y.o.   MRN: [id number]  HPI Patient returns for management of his hypertension, hyperlipidemia, asthma, and other chronic medical problems.  He reports a weight gain of about 60 pounds over the past year.  His chronic complaints include a burning sensation in his left lower extremity which is chronic and intermittent.  He also reports chronic intermittent right foot numbness and mild intermittent weakness.  He denies any bowel or bladder problems.  He reports that he is compliant with his CPAP.  He has followed up regularly with his pain clinic physician Dr. Letta Pate.  He reports that Dr. Letta Pate added gabapentin to his pain regimen in February, but that he has a history of prior intolerance of the gabapentin due to a sensation of facial numbness; he says that he developed the same symptom of facial numbness after starting gabapentin in February, so he stopped gabapentin on his own.      Review of Systems  Constitutional: Negative for fever, chills and diaphoresis.  Respiratory: Positive for shortness of breath (Exertional dyspnea) and wheezing (Occasional).   Cardiovascular: Positive for leg swelling (Occasional slight swelling). Negative for chest pain.  Gastrointestinal: Negative for nausea, vomiting, abdominal pain, blood in stool and anal bleeding.  Genitourinary: Negative for dysuria, frequency and difficulty urinating.  Musculoskeletal: Positive for back pain (Chronic).  Neurological: Positive for numbness (Intermittent right foot). Negative for dizziness, syncope and weakness.    I reviewed and updated the medication list, allergies, past medical history, past surgical history, family history, and social history.     Objective:   Physical Exam  Constitutional: No distress.  Cardiovascular: Normal rate and regular rhythm.  Exam reveals no gallop and no friction rub.   No murmur heard. Trace bilateral ankle  edema  Pulmonary/Chest: Effort normal and breath sounds normal. No respiratory distress. He has no wheezes. He has no rales. He exhibits no tenderness.  Abdominal: Soft. Bowel sounds are normal. He exhibits no distension. There is no tenderness. There is no rebound and no guarding.  Neurological:  5-/5 right lower extremity strength; 5/5 left lower extremity strength.        Assessment & Plan:

## 2014-03-14 NOTE — Assessment & Plan Note (Signed)
Lipids:    Component Value Date/Time   CHOL 144 02/13/2013 1126   TRIG 105 02/13/2013 1126   HDL 42 02/13/2013 1126   LDLCALC 81 02/13/2013 1126   VLDL 21 02/13/2013 1126   CHOLHDL 3.4 02/13/2013 1126    Assessment: Patient is doing well on simvastatin 40 mg daily, with no apparent side effects.  Plan: Check a lipid panel and comprehensive metabolic panel today; continue simvastatin 40 mg daily pending the results.

## 2014-03-14 NOTE — Assessment & Plan Note (Signed)
Assessment: Patient is having some symptoms on Zyrtec 10 mg daily.  However, he cannot tolerate steroids because of psychological side effects, which he says have occurred even with nasal steroids.  Plan: Continue Zyrtec 10 mg daily.

## 2014-03-14 NOTE — Assessment & Plan Note (Addendum)
Assessment: Patient has followed in the pain clinic by Dr. Letta Pate, whom he last saw in February.  Dr. Letta Pate felt that there may have been some progression of his symptoms and that an MRI although not urgent would be helpful; however, patient does not want to pursue a follow-up MRI scan at this time because of his inability to cover the co-pay.    Plan: Patient will follow-up with Dr. Letta Pate as scheduled.

## 2014-03-14 NOTE — Assessment & Plan Note (Signed)
BP Readings from Last 3 Encounters:  03/14/14 152/103  02/18/14 155/83  11/19/13 164/76    Lab Results  Component Value Date   NA 140 03/20/2013   K 4.1 03/20/2013   CREATININE 0.97 03/20/2013    Assessment: Blood pressure control: moderately elevated Progress toward BP goal:  deteriorated Comments: blood pressure is above goal on losartan 25 mg daily and carvedilol 12.5 mg twice a day.  I discussed the option of adding a low-dose thiazide diuretic, but patient was previously on HCTZ and reports that this may have precipitated gout attacks.  Plan: Medications:  increase losartan to a dose of 50 mg daily; continue carvedilol 12.5 mg twice a day Educational resources provided: brochure Self management tools provided: home blood pressure logbook

## 2014-03-14 NOTE — Assessment & Plan Note (Addendum)
Assessment: Patient is doing reasonably well with use of his albuterol inhaler on an as-needed basis.Eduardo Duncan  He has no shortness of breath or wheezing today.  He is followed by Dr. Melvyn Novas, but has not seen Dr. Melvyn Novas recently.  He has been intolerant of inhaled steroids in the past.  Plan: Continue albuterol inhaler 2 puffs every 6 hours as needed; follow-up with Dr. Melvyn Novas.

## 2014-03-14 NOTE — Assessment & Plan Note (Signed)
Assessment: Patient reports that he is compliant with his CPAP.  Plan: Continue CPAP.

## 2014-03-14 NOTE — Assessment & Plan Note (Signed)
Lab Results  Component Value Date   HGB 16.7 03/20/2013   HGB 16.9 02/13/2013   HGB 17.3* 07/28/2009   HGB 16.9 05/13/2009   HGB 17.2* 05/01/2008    Lab Results  Component Value Date   HCT 47.2 03/20/2013   HCT 47.1 02/13/2013   HCT 50.3 07/28/2009   HCT 49.1 05/13/2009   HCT 50.6 05/01/2008   RBC  Date Value Ref Range Status  03/20/2013 5.50 4.22 - 5.81 MIL/uL Final  02/13/2013 5.60 4.22 - 5.81 MIL/uL Final  07/28/2009 5.69 4.22 - 5.81 MIL/uL Final  05/13/2009 5.70 (4.22-5.81 M/uL Final  05/01/2008 5.77 4.22 - 5.81 MIL/uL Final     Assessment: Patient's problem list includes polycythemia rubra vera, but I do not see documentation of a diagnostic workup that would establish that diagnosis.  Recent labs have not been consistent with polycythemia.  Plan: Check a CBC with differential today.

## 2014-03-14 NOTE — Patient Instructions (Signed)
Increase losartan 25 mg to a dose of 2 tablets (50 mg total) once daily.

## 2014-03-27 ENCOUNTER — Encounter: Payer: Self-pay | Admitting: *Deleted

## 2014-04-04 ENCOUNTER — Telehealth: Payer: Self-pay | Admitting: Internal Medicine

## 2014-04-04 NOTE — Telephone Encounter (Signed)
Call to patient to confirm appointment for 04/08/14 at 10:15 lmtcb

## 2014-04-08 ENCOUNTER — Ambulatory Visit (INDEPENDENT_AMBULATORY_CARE_PROVIDER_SITE_OTHER): Payer: Medicare Other | Admitting: Internal Medicine

## 2014-04-08 ENCOUNTER — Encounter: Payer: Self-pay | Admitting: Internal Medicine

## 2014-04-08 VITALS — BP 141/79 | HR 80 | Temp 97.9°F | Wt 383.7 lb

## 2014-04-08 DIAGNOSIS — I10 Essential (primary) hypertension: Secondary | ICD-10-CM

## 2014-04-08 DIAGNOSIS — E785 Hyperlipidemia, unspecified: Secondary | ICD-10-CM

## 2014-04-08 DIAGNOSIS — E669 Obesity, unspecified: Secondary | ICD-10-CM

## 2014-04-08 LAB — BASIC METABOLIC PANEL WITH GFR
BUN: 10 mg/dL (ref 6–23)
CALCIUM: 8.9 mg/dL (ref 8.4–10.5)
CO2: 27 mEq/L (ref 19–32)
Chloride: 105 mEq/L (ref 96–112)
Creat: 1.05 mg/dL (ref 0.50–1.35)
GFR, EST NON AFRICAN AMERICAN: 80 mL/min
GFR, Est African American: >89 mL/min
GLUCOSE: 92 mg/dL (ref 70–99)
Potassium: 4.2 mEq/L (ref 3.5–5.3)
SODIUM: 141 meq/L (ref 135–145)

## 2014-04-08 LAB — TSH: TSH: 0.364 u[IU]/mL (ref 0.350–4.500)

## 2014-04-08 MED ORDER — CARVEDILOL 12.5 MG PO TABS: 12.5000 mg | ORAL_TABLET | Freq: Two times a day (BID) | ORAL | Status: DC

## 2014-04-08 MED ORDER — ALBUTEROL SULFATE HFA 108 (90 BASE) MCG/ACT IN AERS: 2.0000 | INHALATION_SPRAY | Freq: Four times a day (QID) | RESPIRATORY_TRACT | Status: DC | PRN

## 2014-04-08 MED ORDER — LOSARTAN POTASSIUM 50 MG PO TABS: 50.0000 mg | ORAL_TABLET | Freq: Every day | ORAL | Status: DC

## 2014-04-08 MED ORDER — SIMVASTATIN 40 MG PO TABS: 40.0000 mg | ORAL_TABLET | Freq: Every day | ORAL | Status: DC

## 2014-04-08 MED ORDER — FAMOTIDINE 20 MG PO TABS: 10.0000 mg | ORAL_TABLET | Freq: Two times a day (BID) | ORAL | Status: DC

## 2014-04-08 NOTE — Assessment & Plan Note (Signed)
BP Readings from Last 3 Encounters:  04/08/14 141/79  03/14/14 152/103  02/18/14 155/83    Lab Results  Component Value Date   NA 141 04/08/2014   K 4.2 04/08/2014   CREATININE 1.05 04/08/2014    Assessment: Blood pressure control: controlled Progress toward BP goal:  at goal Comments: Blood pressure is essentially at goal on carvedilol 12.5 mg twice a day and losartan 50 mg daily  Plan: Medications:  continue current medications Other plans: Check a basic metabolic panel

## 2014-04-08 NOTE — Progress Notes (Signed)
   Subjective:    Patient ID: Eduardo Duncan, male    DOB: 1960-10-03, 54 y.o.   MRN: [id number]  HPI Patient presents for management of his hypertension, hyperlipidemia, and obesity.  At the time of his last visit on 03/14/2014, I increased his losartan to a dose of 50 mg daily.  He reports no problems on the higher dose of losartan.  He has no acute complaints today.  He reports that he has been compliant with his medications.   Review of Systems  Respiratory: Negative for shortness of breath.   Cardiovascular: Negative for chest pain and leg swelling.  Gastrointestinal: Negative for nausea, vomiting and abdominal pain.  Neurological: Negative for dizziness and light-headedness.       Objective:   Physical Exam  Constitutional: No distress.  Cardiovascular: Normal rate, regular rhythm and normal heart sounds.  Exam reveals no gallop and no friction rub.   No murmur heard. Pulmonary/Chest: Effort normal and breath sounds normal. No respiratory distress. He has no wheezes. He has no rales.  Musculoskeletal: He exhibits no edema.        Assessment & Plan:

## 2014-04-08 NOTE — Patient Instructions (Signed)
Continue current medications. 

## 2014-04-08 NOTE — Assessment & Plan Note (Signed)
Body mass index is 49.93 kg/(m^2).   Wt Readings from Last 3 Encounters:  04/08/14 383 lb 11.2 oz (174.045 kg)  03/14/14 381 lb 9.6 oz (173.093 kg)  11/19/13 362 lb (164.202 kg)     Assessment: Patient has gained about 21 pounds over the past 5 months.  I offered referral to our nutritionist for counseling on weight loss, but patient declined.  Plan: Check TSH today; patient will let me know if he would like a nutrition referral.

## 2014-04-08 NOTE — Assessment & Plan Note (Signed)
Lipids:    Component Value Date/Time   CHOL 171 03/14/2014 1154   TRIG 223* 03/14/2014 1154   HDL 42 03/14/2014 1154   LDLCALC 84 03/14/2014 1154   VLDL 45* 03/14/2014 1154   CHOLHDL 4.1 03/14/2014 1154    Assessment: LDL is at goal on simvastatin 40 mg daily.  Patient is doing well, with no apparent side effects.  Triglycerides are somewhat elevated, possibly due to weight gain.  Plan: Continue simvastatin 40 mg daily.

## 2014-04-09 LAB — T4, FREE: Free T4: 1.18 ng/dL (ref 0.80–1.80)

## 2014-04-10 ENCOUNTER — Ambulatory Visit: Payer: Medicare Other | Admitting: Internal Medicine

## 2014-05-20 ENCOUNTER — Ambulatory Visit: Payer: Medicare Other | Admitting: Physical Medicine & Rehabilitation

## 2014-08-22 ENCOUNTER — Encounter: Payer: Self-pay | Admitting: Internal Medicine

## 2014-10-05 ENCOUNTER — Other Ambulatory Visit: Payer: Self-pay | Admitting: Internal Medicine

## 2014-11-05 ENCOUNTER — Other Ambulatory Visit: Payer: Self-pay | Admitting: Internal Medicine

## 2014-11-18 ENCOUNTER — Other Ambulatory Visit: Payer: Self-pay | Admitting: Internal Medicine

## 2014-11-28 ENCOUNTER — Other Ambulatory Visit: Payer: Self-pay | Admitting: Internal Medicine

## 2015-02-03 ENCOUNTER — Other Ambulatory Visit: Payer: Self-pay | Admitting: Student in an Organized Health Care Education/Training Program

## 2015-02-04 NOTE — Telephone Encounter (Signed)
Pt not seen since March 2016, will try to have pt scheduled ASAP

## 2015-02-17 ENCOUNTER — Ambulatory Visit: Payer: Self-pay | Admitting: Student in an Organized Health Care Education/Training Program

## 2015-04-03 ENCOUNTER — Other Ambulatory Visit: Payer: Self-pay | Admitting: Student in an Organized Health Care Education/Training Program

## 2015-05-06 ENCOUNTER — Other Ambulatory Visit: Payer: Self-pay | Admitting: Student in an Organized Health Care Education/Training Program

## 2015-05-06 NOTE — Telephone Encounter (Signed)
Pt needs appt. asap

## 2015-05-06 NOTE — Telephone Encounter (Signed)
Please call pt back.

## 2015-05-06 NOTE — Telephone Encounter (Signed)
This patient needs appointment ASAP with any provider.  Last seen over 1 year ago.  PCP is currently out on leave.  Thanks

## 2015-05-15 DIAGNOSIS — G4733 Obstructive sleep apnea (adult) (pediatric): Secondary | ICD-10-CM | POA: Diagnosis not present

## 2015-05-18 ENCOUNTER — Other Ambulatory Visit: Payer: Self-pay | Admitting: Student in an Organized Health Care Education/Training Program

## 2015-05-19 NOTE — Telephone Encounter (Signed)
Last appointment 04/08/2014. 05/27/2015 next appointment.

## 2015-05-27 ENCOUNTER — Ambulatory Visit (INDEPENDENT_AMBULATORY_CARE_PROVIDER_SITE_OTHER): Payer: Medicare Other | Admitting: Internal Medicine

## 2015-05-27 ENCOUNTER — Encounter: Payer: Self-pay | Admitting: Internal Medicine

## 2015-05-27 VITALS — BP 136/77 | HR 81 | Temp 98.0°F | Ht 75.4 in | Wt 387.8 lb

## 2015-05-27 DIAGNOSIS — M4812 Ankylosing hyperostosis [Forestier], cervical region: Secondary | ICD-10-CM | POA: Diagnosis not present

## 2015-05-27 DIAGNOSIS — Z6841 Body Mass Index (BMI) 40.0 and over, adult: Secondary | ICD-10-CM

## 2015-05-27 DIAGNOSIS — I10 Essential (primary) hypertension: Secondary | ICD-10-CM | POA: Diagnosis not present

## 2015-05-27 DIAGNOSIS — J45909 Unspecified asthma, uncomplicated: Secondary | ICD-10-CM | POA: Diagnosis not present

## 2015-05-27 DIAGNOSIS — E785 Hyperlipidemia, unspecified: Secondary | ICD-10-CM

## 2015-05-27 DIAGNOSIS — Z7951 Long term (current) use of inhaled steroids: Secondary | ICD-10-CM

## 2015-05-27 DIAGNOSIS — M4816 Ankylosing hyperostosis [Forestier], lumbar region: Secondary | ICD-10-CM

## 2015-05-27 DIAGNOSIS — M4810 Ankylosing hyperostosis [Forestier], site unspecified: Secondary | ICD-10-CM

## 2015-05-27 LAB — POCT GLYCOSYLATED HEMOGLOBIN (HGB A1C): Hemoglobin A1C: 5.6

## 2015-05-27 LAB — GLUCOSE, CAPILLARY: GLUCOSE-CAPILLARY: 100 mg/dL — AB (ref 65–99)

## 2015-05-27 MED ORDER — ALBUTEROL SULFATE HFA 108 (90 BASE) MCG/ACT IN AERS: 2.0000 | INHALATION_SPRAY | Freq: Four times a day (QID) | RESPIRATORY_TRACT | Status: DC | PRN

## 2015-05-27 MED ORDER — CARVEDILOL 12.5 MG PO TABS: 12.5000 mg | ORAL_TABLET | Freq: Two times a day (BID) | ORAL | Status: DC

## 2015-05-27 MED ORDER — SIMVASTATIN 40 MG PO TABS: 40.0000 mg | ORAL_TABLET | Freq: Every day | ORAL | Status: DC

## 2015-05-27 MED ORDER — LOSARTAN POTASSIUM 50 MG PO TABS: 50.0000 mg | ORAL_TABLET | Freq: Every day | ORAL | Status: DC

## 2015-05-27 NOTE — Patient Instructions (Signed)
1. Please schedule a follow up appointment for 6 months.   2. Please take all medications as previously prescribed.  3. If you have worsening of your symptoms or new symptoms arise, please call the clinic [phone] or go to the ER immediately if symptoms are severe.  Exercising to Lose Weight Exercising can help you to lose weight. In order to lose weight through exercise, you need to do vigorous-intensity exercise. You can tell that you are exercising with vigorous intensity if you are breathing very hard and fast and cannot hold a conversation while exercising. Moderate-intensity exercise helps to maintain your current weight. You can tell that you are exercising at a moderate level if you have a higher heart rate and faster breathing, but you are still able to hold a conversation. HOW OFTEN SHOULD I EXERCISE? Choose an activity that you enjoy and set realistic goals. Your health care provider can help you to make an activity plan that works for you. Exercise regularly as directed by your health care provider. This may include:  Doing resistance training twice each week, such as:  Push-ups.  Sit-ups.  Lifting weights.  Using resistance bands.  Doing a given intensity of exercise for a given amount of time. Choose from these options:  150 minutes of moderate-intensity exercise every week.  75 minutes of vigorous-intensity exercise every week.  A mix of moderate-intensity and vigorous-intensity exercise every week. Children, pregnant women, people who are out of shape, people who are overweight, and older adults may need to consult a health care provider for individual recommendations. If you have any sort of medical condition, be sure to consult your health care provider before starting a new exercise program. WHAT ARE SOME ACTIVITIES THAT CAN HELP ME TO LOSE WEIGHT?   Walking at a rate of at least 4.5 miles an hour.  Jogging or running at a rate of 5 miles per hour.  Biking at  a rate of at least 10 miles per hour.  Lap swimming.  Roller-skating or in-line skating.  Cross-country skiing.  Vigorous competitive sports, such as football, basketball, and soccer.  Jumping rope.  Aerobic dancing. HOW CAN I BE MORE ACTIVE IN MY DAY-TO-DAY ACTIVITIES?  Use the stairs instead of the elevator.  Take a walk during your lunch break.  If you drive, park your car farther away from work or school.  If you take public transportation, get off one stop early and walk the rest of the way.  Make all of your phone calls while standing up and walking around.  Get up, stretch, and walk around every 30 minutes throughout the day. WHAT GUIDELINES SHOULD I FOLLOW WHILE EXERCISING?  Do not exercise so much that you hurt yourself, feel dizzy, or get very short of breath.  Consult your health care provider prior to starting a new exercise program.  Wear comfortable clothes and shoes with good support.  Drink plenty of water while you exercise to prevent dehydration or heat stroke. Body water is lost during exercise and must be replaced.  Work out until you breathe faster and your heart beats faster.   This information is not intended to replace advice given to you by your health care provider. Make sure you discuss any questions you have with your health care provider.   Document Released: 01/30/2010 Document Revised: 01/18/2014 Document Reviewed: 05/31/2013 Elsevier Interactive Patient Education 2016 [employer] Calories are energy you get from the things you eat and drink.  Your body uses this energy to keep you going throughout the day. The number of calories you eat affects your weight. When you eat more calories than your body needs, your body stores the extra calories as fat. When you eat fewer calories than your body needs, your body burns fat to get the energy it needs. Calorie counting means keeping track of how many calories you  eat and drink each day. If you make sure to eat fewer calories than your body needs, you should lose weight. In order for calorie counting to work, you will need to eat the number of calories that are right for you in a day to lose a healthy amount of weight per week. A healthy amount of weight to lose per week is usually 1-2 lb (0.5-0.9 kg). A dietitian can determine how many calories you need in a day and give you suggestions on how to reach your calorie goal.  WHAT IS MY MY PLAN? My goal is to have __________ calories per day.  If I have this many calories per day, I should lose around __________ pounds per week. WHAT DO I NEED TO KNOW ABOUT CALORIE COUNTING? In order to meet your daily calorie goal, you will need to:  Find out how many calories are in each food you would like to eat. Try to do this before you eat.  Decide how much of the food you can eat.  Write down what you ate and how many calories it had. Doing this is called keeping a food log. WHERE DO I FIND CALORIE INFORMATION? The number of calories in a food can be found on a Nutrition Facts label. Note that all the information on a label is based on a specific serving of the food. If a food does not have a Nutrition Facts label, try to look up the calories online or ask your dietitian for help. HOW DO I DECIDE HOW MUCH TO EAT? To decide how much of the food you can eat, you will need to consider both the number of calories in one serving and the size of one serving. This information can be found on the Nutrition Facts label. If a food does not have a Nutrition Facts label, look up the information online or ask your dietitian for help. Remember that calories are listed per serving. If you choose to have more than one serving of a food, you will have to multiply the calories per serving by the amount of servings you plan to eat. For example, the label on a package of bread might say that a serving size is 1 slice and that there are 90  calories in a serving. If you eat 1 slice, you will have eaten 90 calories. If you eat 2 slices, you will have eaten 180 calories. HOW DO I KEEP A FOOD LOG? After each meal, record the following information in your food log:  What you ate.  How much of it you ate.  How many calories it had.  Then, add up your calories. Keep your food log near you, such as in a small notebook in your pocket. Another option is to use a mobile app or website. Some programs will calculate calories for you and show you how many calories you have left each time you add an item to the log. WHAT ARE SOME CALORIE COUNTING TIPS?  Use your calories on foods and drinks that will fill you up and not leave you hungry. Some examples of this  include foods like nuts and nut butters, vegetables, lean proteins, and high-fiber foods (more than 5 g fiber per serving).  Eat nutritious foods and avoid empty calories. Empty calories are calories you get from foods or beverages that do not have many nutrients, such as candy and soda. It is better to have a nutritious high-calorie food (such as an avocado) than a food with few nutrients (such as a bag of chips).  Know how many calories are in the foods you eat most often. This way, you do not have to look up how many calories they have each time you eat them.  Look out for foods that may seem like low-calorie foods but are really high-calorie foods, such as baked goods, soda, and fat-free candy.  Pay attention to calories in drinks. Drinks such as sodas, specialty coffee drinks, alcohol, and juices have a lot of calories yet do not fill you up. Choose low-calorie drinks like water and diet drinks.  Focus your calorie counting efforts on higher calorie items. Logging the calories in a garden salad that contains only vegetables is less important than calculating the calories in a milk shake.  Find a way of tracking calories that works for you. Get creative. Most people who are  successful find ways to keep track of how much they eat in a day, even if they do not count every calorie. WHAT ARE SOME PORTION CONTROL TIPS?  Know how many calories are in a serving. This will help you know how many servings of a certain food you can have.  Use a measuring cup to measure serving sizes. This is helpful when you start out. With time, you will be able to estimate serving sizes for some foods.  Take some time to put servings of different foods on your favorite plates, bowls, and cups so you know what a serving looks like.  Try not to eat straight from a bag or box. Doing this can lead to overeating. Put the amount you would like to eat in a cup or on a plate to make sure you are eating the right portion.  Use smaller plates, glasses, and bowls to prevent overeating. This is a quick and easy way to practice portion control. If your plate is smaller, less food can fit on it.  Try not to multitask while eating, such as watching TV or using your computer. If it is time to eat, sit down at a table and enjoy your food. Doing this will help you to start recognizing when you are full. It will also make you more aware of what and how much you are eating. HOW CAN I CALORIE COUNT WHEN EATING OUT?  Ask for smaller portion sizes or child-sized portions.  Consider sharing an entree and sides instead of getting your own entree.  If you get your own entree, eat only half. Ask for a box at the beginning of your meal and put the rest of your entree in it so you are not tempted to eat it.  Look for the calories on the menu. If calories are listed, choose the lower calorie options.  Choose dishes that include vegetables, fruits, whole grains, low-fat dairy products, and lean protein. Focusing on smart food choices from each of the 5 food groups can help you stay on track at restaurants.  Choose items that are boiled, broiled, grilled, or steamed.  Choose water, milk, unsweetened iced tea, or  other drinks without added sugars. If you want an alcoholic  beverage, choose a lower calorie option. For example, a regular margarita can have up to 700 calories and a glass of wine has around 150.  Stay away from items that are buttered, battered, fried, or served with cream sauce. Items labeled "crispy" are usually fried, unless stated otherwise.  Ask for dressings, sauces, and syrups on the side. These are usually very high in calories, so do not eat much of them.  Watch out for salads. Many people think salads are a healthy option, but this is often not the case. Many salads come with bacon, fried chicken, lots of cheese, fried chips, and dressing. All of these items have a lot of calories. If you want a salad, choose a garden salad and ask for grilled meats or steak. Ask for the dressing on the side, or ask for olive oil and vinegar or lemon to use as dressing.  Estimate how many servings of a food you are given. For example, a serving of cooked rice is  cup or about the size of half a tennis ball or one cupcake wrapper. Knowing serving sizes will help you be aware of how much food you are eating at restaurants. The list below tells you how big or small some common portion sizes are based on everyday objects.  1 oz--4 stacked dice.  3 oz--1 deck of cards.  1 tsp--1 dice.  1 Tbsp-- a Ping-Pong ball.  2 Tbsp--1 Ping-Pong ball.   cup--1 tennis ball or 1 cupcake wrapper.  1 cup--1 baseball.   This information is not intended to replace advice given to you by your health care provider. Make sure you discuss any questions you have with your health care provider.   Document Released: 12/28/2004 Document Revised: 01/18/2014 Document Reviewed: 11/02/2012 Elsevier Interactive Patient Education [employer].

## 2015-05-27 NOTE — Progress Notes (Signed)
Subjective:   Patient ID: Eduardo Duncan male   DOB: Sep 27, 1960 55 y.o.   MRN: [id number]  HPI: Eduardo Duncan is a 54 y.o. male w/ PMHx of HTN, HLD, osteoporosis, asthma, and obesity, presents to the clinic today for a follow-up visit regarding his HTN. Patient states he is doing okay these days, still complaining of chronic back pain and neck pain. This has always been a complaint for him, he has previously been diagnosed with DISH. He has continued to gain weight and thinks that this is related to his thyroid and would like to have his thyroid checked. Takes his BP medications every day, does not miss a dose. No LE weakness, no incontinence, no saddle anesthesia. Says he gets some numbness in his fourth and fifth fingers bilaterally, which has been present for years, according to the patient.   He says he attempts to exercise most days, mows the lawn regularly, and says he eats very healthy. Prepares his own food usually, chicken breasts, vegetables, no fried foods or sweets. Is careful about his carbs.   Past Medical History  Diagnosis Date  . Allergy   . Dizziness   . Osteoporosis   . Hypertension   . Depression   . GERD (gastroesophageal reflux disease)   . Hyperlipidemia   . Polycythemia rubra vera   . Obstructive sleep apnea     On CPAP  . Spinal stenosis of lumbar region   . Arthritis   . Asthma   . Cervicogenic headache 12/25/2012  . Obesity    Current Outpatient Prescriptions  Medication Sig Dispense Refill  . acetaminophen (TYLENOL) 500 MG tablet Take 500 mg by mouth every 6 (six) hours as needed.    Marland Kitchen albuterol (PROAIR HFA) 108 (90 BASE) MCG/ACT inhaler Inhale 2 puffs into the lungs every 6 (six) hours as needed for wheezing or shortness of breath. 18 g 5  . aspirin (ASPIRIN ADULT LOW STRENGTH) 81 MG EC tablet Take 81 mg by mouth daily.      . carvedilol (COREG) 12.5 MG tablet TAKE ONE TABLET BY MOUTH TWICE DAILY WITH A MEAL 180 tablet 2  . cetirizine (ZYRTEC)  10 MG tablet Take 10 mg by mouth daily.    . famotidine (PEPCID) 20 MG tablet TAKE ONE-HALF TABLET BY MOUTH TWICE DAILY 90 tablet 0  . ibuprofen (ADVIL,MOTRIN) 200 MG tablet Take 600 mg by mouth 3 (three) times daily as needed.     Marland Kitchen losartan (COZAAR) 50 MG tablet TAKE ONE TABLET BY MOUTH ONCE DAILY 90 tablet 3  . simvastatin (ZOCOR) 40 MG tablet TAKE ONE TABLET BY MOUTH AT BEDTIME 30 tablet 0   No current facility-administered medications for this visit.    Review of Systems: General: Denies fever, chills, diaphoresis, appetite change and fatigue.  Respiratory: Denies SOB, DOE, cough, and wheezing.   Cardiovascular: Denies chest pain and palpitations.  Gastrointestinal: Denies nausea, vomiting, abdominal pain, and diarrhea.  Genitourinary: Denies dysuria, increased frequency, and flank pain. Endocrine: Denies hot or cold intolerance, polyuria, and polydipsia. Musculoskeletal: Positive for chronic back pain. Denies myalgias, joint swelling, arthralgias and gait problem.  Skin: Denies pallor, rash and wounds.  Neurological: Denies dizziness, seizures, syncope, weakness, lightheadedness, numbness and headaches.  Psychiatric/Behavioral: Denies mood changes, and sleep disturbances.  Objective:   Physical Exam: Filed Vitals:   05/27/15 1507 05/27/15 1513  BP: 136/77   Pulse: 81   Temp: 98 F (36.7 C)   TempSrc: Oral   Height:  6' 3.4" (1.915 m)  Weight: 387 lb 12.8 oz (175.905 kg)   SpO2: 98%     General: Morbidly obese male, alert, cooperative, NAD. HEENT: PERRL, EOMI. Moist mucus membranes Neck: Full range of motion without pain, supple, no lymphadenopathy or carotid bruits Lungs: Clear to ascultation bilaterally, normal work of respiration, no wheezes, rales, rhonchi Heart: RRR, no murmurs, gallops, or rubs Abdomen: Soft, non-tender, non-distended, BS + Extremities: No cyanosis, clubbing, or edema Neurologic: Alert & oriented x3, cranial nerves II-XII intact, strength grossly  intact, sensation intact to light touch   Assessment & Plan:   Please see problem based assessment and plan.

## 2015-05-28 LAB — BMP8+ANION GAP
Anion Gap: 21 mmol/L — ABNORMAL HIGH (ref 10.0–18.0)
BUN/Creatinine Ratio: 10 (ref 9–20)
BUN: 11 mg/dL (ref 6–24)
CALCIUM: 9.2 mg/dL (ref 8.7–10.2)
CO2: 19 mmol/L (ref 18–29)
Chloride: 101 mmol/L (ref 96–106)
Creatinine, Ser: 1.05 mg/dL (ref 0.76–1.27)
GFR calc Af Amer: 91 mL/min/{1.73_m2} (ref >59)
GFR, EST NON AFRICAN AMERICAN: 79 mL/min/{1.73_m2} (ref >59)
Glucose: 109 mg/dL — ABNORMAL HIGH (ref 65–99)
POTASSIUM: 4.3 mmol/L (ref 3.5–5.2)
Sodium: 141 mmol/L (ref 134–144)

## 2015-05-28 LAB — LIPID PANEL
CHOL/HDL RATIO: 3.9 ratio (ref 0.0–5.0)
Cholesterol, Total: 150 mg/dL (ref 100–199)
HDL: 38 mg/dL — AB (ref >39)
LDL Calculated: 73 mg/dL (ref 0–99)
TRIGLYCERIDES: 196 mg/dL — AB (ref 0–149)
VLDL CHOLESTEROL CAL: 39 mg/dL (ref 5–40)

## 2015-05-28 LAB — TSH: TSH: 0.811 u[IU]/mL (ref 0.450–4.500)

## 2015-05-28 NOTE — Assessment & Plan Note (Addendum)
Patient has gained 70 lbs since 2014. Says he attempts to eat healthy and feels he is eating the right things. Took a nutrition course long ago that he says guides his eating habits. Tries to exercise regularly, mows his lawn once per week. Convinced his weight gain is related to thyroid dysfunction. Rechecked TSH at patient's request, once again found to be normal. HbA1c 5.6. Discussed weight loss extensively. Given information about calorie counting and healthy foods, as well as weight loss exercises.  -Advised exercise program, diet program -Does not wish to meet with nutritionist today -RTC in 6 months

## 2015-05-28 NOTE — Assessment & Plan Note (Signed)
BP Readings from Last 3 Encounters:  05/27/15 136/77  04/08/14 141/79  03/14/14 152/103    Lab Results  Component Value Date   NA 141 05/27/2015   K 4.3 05/27/2015   CREATININE 1.05 05/27/2015    Assessment: Blood pressure control:  Well controlled Progress toward BP goal:   At goal Comments: Taking Losartan 50 mg daily, Coreg 12.5 mg bid.   Plan: Medications:  continue current medications. Check BMP today.  Educational resources provided: brochure (denies) Self management tools provided:   Other plans: RTC in 6 months

## 2015-05-28 NOTE — Assessment & Plan Note (Signed)
Well controlled. Refill Albuterol inhaler

## 2015-05-28 NOTE — Assessment & Plan Note (Signed)
Check lipids today.  Continue statin. 

## 2015-05-28 NOTE — Assessment & Plan Note (Signed)
Continues to cause him lumbar back pain and neck pain with cervical radiculopathy. His weight is the biggest issue here. He is severely overweight and surely contributes to his lower back pain at the very least. Explained th importance of weight loss with respect to his back problems. He understands.  -Follows with Dr. Dolphus Jenny. Is planning on seeing him in next few months.

## 2015-05-29 NOTE — Progress Notes (Signed)
Case discussed with Dr. Jones at the time of the visit.  We reviewed the resident's history and exam and pertinent patient test results.  I agree with the assessment, diagnosis, and plan of care documented in the resident's note. 

## 2015-05-30 DIAGNOSIS — G4733 Obstructive sleep apnea (adult) (pediatric): Secondary | ICD-10-CM | POA: Diagnosis not present

## 2015-07-04 ENCOUNTER — Ambulatory Visit (HOSPITAL_BASED_OUTPATIENT_CLINIC_OR_DEPARTMENT_OTHER): Payer: Medicare Other | Admitting: Physical Medicine & Rehabilitation

## 2015-07-04 ENCOUNTER — Encounter: Payer: Self-pay | Admitting: Physical Medicine & Rehabilitation

## 2015-07-04 ENCOUNTER — Encounter: Payer: Medicare Other | Attending: Physical Medicine & Rehabilitation

## 2015-07-04 VITALS — BP 166/82 | HR 75 | Resp 17

## 2015-07-04 DIAGNOSIS — Z833 Family history of diabetes mellitus: Secondary | ICD-10-CM | POA: Diagnosis not present

## 2015-07-04 DIAGNOSIS — R200 Anesthesia of skin: Secondary | ICD-10-CM | POA: Insufficient documentation

## 2015-07-04 DIAGNOSIS — I10 Essential (primary) hypertension: Secondary | ICD-10-CM | POA: Diagnosis not present

## 2015-07-04 DIAGNOSIS — E785 Hyperlipidemia, unspecified: Secondary | ICD-10-CM | POA: Diagnosis not present

## 2015-07-04 DIAGNOSIS — R0602 Shortness of breath: Secondary | ICD-10-CM | POA: Diagnosis not present

## 2015-07-04 DIAGNOSIS — Z800 Family history of malignant neoplasm of digestive organs: Secondary | ICD-10-CM | POA: Diagnosis not present

## 2015-07-04 DIAGNOSIS — M4802 Spinal stenosis, cervical region: Secondary | ICD-10-CM

## 2015-07-04 DIAGNOSIS — D45 Polycythemia vera: Secondary | ICD-10-CM | POA: Insufficient documentation

## 2015-07-04 DIAGNOSIS — Z9049 Acquired absence of other specified parts of digestive tract: Secondary | ICD-10-CM | POA: Diagnosis not present

## 2015-07-04 DIAGNOSIS — R51 Headache: Secondary | ICD-10-CM | POA: Insufficient documentation

## 2015-07-04 DIAGNOSIS — K219 Gastro-esophageal reflux disease without esophagitis: Secondary | ICD-10-CM | POA: Diagnosis not present

## 2015-07-04 DIAGNOSIS — R531 Weakness: Secondary | ICD-10-CM | POA: Insufficient documentation

## 2015-07-04 DIAGNOSIS — M810 Age-related osteoporosis without current pathological fracture: Secondary | ICD-10-CM | POA: Insufficient documentation

## 2015-07-04 DIAGNOSIS — R251 Tremor, unspecified: Secondary | ICD-10-CM | POA: Insufficient documentation

## 2015-07-04 DIAGNOSIS — M4810 Ankylosing hyperostosis [Forestier], site unspecified: Secondary | ICD-10-CM

## 2015-07-04 DIAGNOSIS — Z823 Family history of stroke: Secondary | ICD-10-CM | POA: Diagnosis not present

## 2015-07-04 DIAGNOSIS — F1021 Alcohol dependence, in remission: Secondary | ICD-10-CM | POA: Insufficient documentation

## 2015-07-04 DIAGNOSIS — M7989 Other specified soft tissue disorders: Secondary | ICD-10-CM | POA: Diagnosis not present

## 2015-07-04 DIAGNOSIS — Z8249 Family history of ischemic heart disease and other diseases of the circulatory system: Secondary | ICD-10-CM | POA: Diagnosis not present

## 2015-07-04 DIAGNOSIS — R42 Dizziness and giddiness: Secondary | ICD-10-CM | POA: Diagnosis not present

## 2015-07-04 DIAGNOSIS — Z8041 Family history of malignant neoplasm of ovary: Secondary | ICD-10-CM | POA: Diagnosis not present

## 2015-07-04 DIAGNOSIS — Z87891 Personal history of nicotine dependence: Secondary | ICD-10-CM | POA: Diagnosis not present

## 2015-07-04 DIAGNOSIS — R062 Wheezing: Secondary | ICD-10-CM | POA: Insufficient documentation

## 2015-07-04 NOTE — Progress Notes (Signed)
Subjective:    Patient ID: Eduardo Duncan, male    DOB: 05-04-1960, 55 y.o.   MRN: [id number]  HPI Moved to [place]. Has to mow 2 acres with push mower No falls   Pain Inventory Average Pain 7 Pain Right Now NA My pain is sharp, burning, stabbing, tingling and aching  In the last 24 hours, has pain interfered with the following? General activity 6 Relation with others 5 Enjoyment of life 5 What TIME of day is your pain at its worst? NA Sleep (in general) Fair  Pain is worse with: other Pain improves with: rest, heat/ice and TENS Relief from Meds: 6  Mobility walk without assistance how many minutes can you walk? varies day to day ability to climb steps?  yes do you drive?  yes  Function disabled: date disabled 2003  Neuro/Psych weakness numbness tremor tingling spasms depression anxiety  Prior Studies Any changes since last visit?  no  Physicians involved in your care Any changes since last visit?  no   Family History  Problem Relation Age of Onset  . Diabetes Mother   . Hypertension Mother   . Alzheimer's disease Mother   . Heart disease Mother   . Heart disease Father   . Colon cancer Brother 37  . Coronary artery disease Brother 47  . Stroke Brother   . Cancer Maternal Grandmother   . Ovarian cancer Paternal Grandmother    Social History   Social History  . Marital Status: Single    Spouse Name: N/A  . Number of Children: 0  . Years of Education: trade   Occupational History  . disability      10/2006--has not worked since 2004   Social History Main Topics  . Smoking status: Former Smoker -- 1.00 packs/day for 12 years    Types: Cigarettes    Quit date: 03/13/1986  . Smokeless tobacco: Never Used  . 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 Drug Use: No  . Sexual Activity: Not Asked   Other Topics Concern  . None   Social History Narrative   Registration notes: Red bag  given 05/10/08.         Past Surgical History  Procedure Laterality Date  . Cholecystectomy  07/2009    Lap  . Colonoscopy  01/2009  . Cholecystectomy  07/11/2009   Past Medical History  Diagnosis Date  . Allergy   . Dizziness   . Osteoporosis   . Hypertension   . Depression   . GERD (gastroesophageal reflux disease)   . Hyperlipidemia   . Polycythemia rubra vera ([place])   . Obstructive sleep apnea     On CPAP  . Spinal stenosis of lumbar region   . Arthritis   . Asthma   . Cervicogenic headache 12/25/2012  . Obesity    BP 166/82 mmHg  Pulse 75  Resp 17  SpO2 96%  Opioid Risk Score:   Fall Risk Score:  `1  Depression screen PHQ 2/9  Depression screen [facility] 2/9 05/27/2015 04/08/2014 03/14/2014 05/16/2013 03/20/2013 02/13/2013 05/12/2012  Decreased Interest 0 0 0 0 0 0 0  Down, Depressed, Hopeless 0 1 1 0 0 0 0  PHQ - 2 Score 0 1 1 0 0 0 0     Review of Systems  Constitutional: Positive for unexpected weight change.  Respiratory: Positive for apnea, shortness of breath and wheezing.   Cardiovascular:  Limb swelling   Neurological: Positive for tremors, weakness and numbness.       Tingling  Spasms   Psychiatric/Behavioral: Positive for decreased concentration. The patient is nervous/anxious.   All other systems reviewed and are negative.      Objective:   Physical Exam  Constitutional: He is oriented to person, place, and time.  Musculoskeletal:       Right hip: He exhibits decreased range of motion. He exhibits no bony tenderness.       Left hip: He exhibits decreased range of motion. He exhibits no bony tenderness.       Lumbar back: He exhibits decreased range of motion and tenderness.  There is 75% flexion extension lateral bending and rotation at the lumbar spine. Tenderness palpation lumbar paraspinal muscles bilaterally.  Decreased bilateral hip internal greater than external rotation  Neurological: He is alert and oriented to person, place, and time.    Psychiatric: He has a normal mood and affect.    Cervical range of motion 25% flexion and extension, 50% rotation right and left, 25% lateral bending right and left Decreased right L5 decreased light L3 pinprick Decreased median nerve distribution pinprick left Deep tendon reflexes are normal In bilateral biceps triceps brachioradialis, patellar and Achilles Strength is normal bilateral deltoid, biceps, triceps, grip, hip flexor, knee extensor, ankle dorsiflexor. Negative straight leg raising  Has good range of motion at bilateral shoulders  No signs of intrinsic atrophy in the hands or feet.    Assessment & Plan:  1. History of congenital spinal stenosis cervical Greatest at C3-4 and Thoracic at T9-T10, No evidence of  myelopathy.Last thoracic MRI 2014, last cervical MRI 2012 Patient has some symptoms of subjective weakness.  2. Lumbar facet arthropathy no evidence of radiculopathy or sciatic symptoms. Last MRI 2012showed no compressive lesions. Only neurologic deficit today was decreased pinprick sensation right L5 right L3. 3. Subjective hand numbness on the left side, does have some decreased sensation in the median nerve distribution. I suspect this is more of a carpal tunnel type situation given that he mows his grass 4 hours using a push mower  Return to clinic in 3 months, If patient has progressive numbness or develops some weakness of the upper extremity would get cervical MRI. At this point I do not see any lower extremity symptomatology to warrant repeat thoracic or lumbar MRI  We discussed his symptoms as well as his examination. We discussed that if his sensory symptoms become more constant or spread to a larger area, he would call this office. Also we discussed that if he develops increased Upper or lower extremity weakness he should call this office.

## 2015-08-15 ENCOUNTER — Other Ambulatory Visit: Payer: Self-pay | Admitting: *Deleted

## 2015-08-16 MED ORDER — FAMOTIDINE 20 MG PO TABS: 10.0000 mg | ORAL_TABLET | Freq: Two times a day (BID) | ORAL | 3 refills | Status: DC

## 2015-09-22 DIAGNOSIS — G4733 Obstructive sleep apnea (adult) (pediatric): Secondary | ICD-10-CM | POA: Diagnosis not present

## 2015-10-03 ENCOUNTER — Ambulatory Visit: Payer: Self-pay | Admitting: Physical Medicine & Rehabilitation

## 2015-10-06 ENCOUNTER — Ambulatory Visit: Payer: Self-pay | Admitting: Physical Medicine & Rehabilitation

## 2015-11-03 ENCOUNTER — Ambulatory Visit: Payer: Self-pay | Admitting: Physical Medicine & Rehabilitation

## 2015-11-04 ENCOUNTER — Ambulatory Visit (HOSPITAL_BASED_OUTPATIENT_CLINIC_OR_DEPARTMENT_OTHER): Payer: Medicare Other | Admitting: Physical Medicine & Rehabilitation

## 2015-11-04 ENCOUNTER — Encounter: Payer: Self-pay | Admitting: Physical Medicine & Rehabilitation

## 2015-11-04 ENCOUNTER — Encounter: Payer: Medicare Other | Attending: Physical Medicine & Rehabilitation

## 2015-11-04 VITALS — BP 151/81 | HR 74 | Resp 14

## 2015-11-04 DIAGNOSIS — G8929 Other chronic pain: Secondary | ICD-10-CM | POA: Insufficient documentation

## 2015-11-04 DIAGNOSIS — Z8041 Family history of malignant neoplasm of ovary: Secondary | ICD-10-CM | POA: Diagnosis not present

## 2015-11-04 DIAGNOSIS — K219 Gastro-esophageal reflux disease without esophagitis: Secondary | ICD-10-CM | POA: Insufficient documentation

## 2015-11-04 DIAGNOSIS — Z87891 Personal history of nicotine dependence: Secondary | ICD-10-CM | POA: Diagnosis not present

## 2015-11-04 DIAGNOSIS — Z823 Family history of stroke: Secondary | ICD-10-CM | POA: Insufficient documentation

## 2015-11-04 DIAGNOSIS — Z9049 Acquired absence of other specified parts of digestive tract: Secondary | ICD-10-CM | POA: Diagnosis not present

## 2015-11-04 DIAGNOSIS — M48061 Spinal stenosis, lumbar region without neurogenic claudication: Secondary | ICD-10-CM

## 2015-11-04 DIAGNOSIS — M4810 Ankylosing hyperostosis [Forestier], site unspecified: Secondary | ICD-10-CM | POA: Diagnosis not present

## 2015-11-04 DIAGNOSIS — M47814 Spondylosis without myelopathy or radiculopathy, thoracic region: Secondary | ICD-10-CM | POA: Diagnosis not present

## 2015-11-04 DIAGNOSIS — D45 Polycythemia vera: Secondary | ICD-10-CM | POA: Diagnosis not present

## 2015-11-04 DIAGNOSIS — Z8249 Family history of ischemic heart disease and other diseases of the circulatory system: Secondary | ICD-10-CM | POA: Insufficient documentation

## 2015-11-04 DIAGNOSIS — G4733 Obstructive sleep apnea (adult) (pediatric): Secondary | ICD-10-CM | POA: Insufficient documentation

## 2015-11-04 DIAGNOSIS — E669 Obesity, unspecified: Secondary | ICD-10-CM | POA: Diagnosis not present

## 2015-11-04 DIAGNOSIS — H2513 Age-related nuclear cataract, bilateral: Secondary | ICD-10-CM | POA: Diagnosis not present

## 2015-11-04 DIAGNOSIS — Z800 Family history of malignant neoplasm of digestive organs: Secondary | ICD-10-CM | POA: Diagnosis not present

## 2015-11-04 DIAGNOSIS — Z809 Family history of malignant neoplasm, unspecified: Secondary | ICD-10-CM | POA: Insufficient documentation

## 2015-11-04 DIAGNOSIS — I10 Essential (primary) hypertension: Secondary | ICD-10-CM | POA: Diagnosis not present

## 2015-11-04 DIAGNOSIS — H43392 Other vitreous opacities, left eye: Secondary | ICD-10-CM | POA: Diagnosis not present

## 2015-11-04 DIAGNOSIS — E785 Hyperlipidemia, unspecified: Secondary | ICD-10-CM | POA: Insufficient documentation

## 2015-11-04 DIAGNOSIS — H43811 Vitreous degeneration, right eye: Secondary | ICD-10-CM | POA: Diagnosis not present

## 2015-11-04 NOTE — Patient Instructions (Signed)
We will order MRI of the lumbar spine. They should call you to schedule. We'll follow-up in approximately 1 month to discuss results as well as further treatment options

## 2015-11-04 NOTE — Progress Notes (Signed)
Subjective:    Patient ID: Eduardo Duncan, male    DOB: Sep 24, 1960, 55 y.o.   MRN: [id number]  HPI Patient returns with numerous pain complaints. He has a long history of chronic pain including cervical, thoracic and lumbar areas. Last visit complaining of hand numbness mainly on the left side. Still has this and also has this on the right side when grasping steering wheel. Starting to develop some tearing type sensation in his thighs as well, this occurs when getting out of his lift chair. Complaints of numbness in his legs with prolonged standing. Patient states he gets occasional weakness feeling in his legs but overall he has not had any decline in strength, he has not had any falls Thoracic pain is constant.  No loss of bowel or bladder function.  Pain Inventory Average Pain 8 Pain Right Now 8 My pain is constant, sharp, burning, tingling and aching  In the last 24 hours, has pain interfered with the following? General activity 7 Relation with others 7 Enjoyment of life 7 What TIME of day is your pain at its worst? all Sleep (in general) Poor  Pain is worse with: walking, bending and standing Pain improves with: medication Relief from Meds: 5  Mobility walk without assistance ability to climb steps?  yes do you drive?  yes  Function disabled: date disabled 2003  Neuro/Psych weakness numbness tingling dizziness  Prior Studies Any changes since last visit?  no  Physicians involved in your care Any changes since last visit?  no   Family History  Problem Relation Age of Onset  . Diabetes Mother   . Hypertension Mother   . Alzheimer's disease Mother   . Heart disease Mother   . Heart disease Father   . Colon cancer Brother 61  . Coronary artery disease Brother 69  . Stroke Brother   . Cancer Maternal Grandmother   . Ovarian cancer Paternal Grandmother    Social History   Social History  . Marital status: Single    Spouse name: N/A  . Number of  children: 0  . Years of education: trade   Occupational History  . disability  Disabled    10/2006--has not worked since 2004   Social History Main Topics  . Smoking status: Former Smoker    Packs/day: 1.00    Years: 12.00    Types: Cigarettes    Quit date: 03/13/1986  . Smokeless tobacco: Never Used  . Alcohol use No     Comment: No alcohol for 2 years--used to drink heavily in late teens  . Drug use: No  . Sexual activity: Not Asked   Other Topics Concern  . None   Social History Narrative   Registration notes: Red bag given 05/10/08.         Past Surgical History:  Procedure Laterality Date  . CHOLECYSTECTOMY  07/2009   Lap  . CHOLECYSTECTOMY  07/11/2009  . COLONOSCOPY  01/2009   Past Medical History:  Diagnosis Date  . Allergy   . Arthritis   . Asthma   . Cervicogenic headache 12/25/2012  . Depression   . Dizziness   . GERD (gastroesophageal reflux disease)   . Hyperlipidemia   . Hypertension   . Obesity   . Obstructive sleep apnea    On CPAP  . Osteoporosis   . 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 Spinal stenosis of lumbar region    BP (!) 151/81   Pulse 74   Resp 14  SpO2 96%   Opioid Risk Score:   Fall Risk Score:  `1  Depression screen PHQ 2/9  Depression screen [facility] 2/9 11/04/2015 05/27/2015 04/08/2014 03/14/2014 05/16/2013 03/20/2013 02/13/2013  Decreased Interest 0 0 0 0 0 0 0  Down, Depressed, Hopeless 0 0 1 1 0 0 0  PHQ - 2 Score 0 0 1 1 0 0 0   Review of Systems  Respiratory: Positive for apnea.   All other systems reviewed and are negative.      Objective:   Physical Exam  Constitutional: He is oriented to person, place, and time. He appears well-developed and well-nourished.  HENT:  Head: Normocephalic and atraumatic.  Eyes: Conjunctivae and EOM are normal. Pupils are equal, round, and reactive to light.  Neurological: He is alert and oriented to person, place, and time. He has normal strength. Gait normal.  Reflex Scores:      Patellar  reflexes are 1+ on the right side and 1+ on the left side.      Achilles reflexes are 0 on the right side and 0 on the left side. Psychiatric: He has a normal mood and affect.  Nursing note and vitals reviewed.   Decreased sensation bilateral lateral thigh as well as left medial thigh Decreased bilateral L 5 , intact. S1 intact, L4 Strength is 5/5 bilateral hip flexion, knee extension, ankle dorsiflexion, plantarflexion. There is mild tenderness palpation in the cervical, thoracic and lumbar paraspinal muscles. No pain over the greater trochanters of the hip. No pain over the PSIS area    Assessment & Plan:  1. History of congenital spinal stenosis cervical Greatest at C3-4 and Thoracic at T9-T10, No evidence of  myelopathy.Last thoracic MRI 2014, last cervical MRI 2012 Patient has some symptoms of subjective weakness.  His hand symptoms are most likely related to carpal tunnel syndrome as they mainly relate to numbness with certain positions such as driving. Some improvements. Now that he quit weight lifting. If this persists, may consider EMG/NCV  2. Lumbar facet arthropathy no evidence of radiculopathy or sciatic symptoms. Last MRI 2012showed no compressive lesions. neurologic deficit today was decreased pinprick sensation in bilateral  L5 , and this progressed from right-sided L5 last month Would recommend repeat MRI of the lumbar spine to further assess his symptomatology. Further treatments may include epidural steroid injection verses neurosurgical referral.  We discussed his pain complaints that are in his upper and lower body. We discussed potential etiologies Over half of the 25 min visit was spent counseling and coordinating care.

## 2015-11-26 ENCOUNTER — Other Ambulatory Visit: Payer: Self-pay | Admitting: Internal Medicine

## 2015-11-26 DIAGNOSIS — E785 Hyperlipidemia, unspecified: Secondary | ICD-10-CM

## 2015-11-27 ENCOUNTER — Encounter: Payer: Self-pay | Admitting: Internal Medicine

## 2015-11-29 ENCOUNTER — Ambulatory Visit (HOSPITAL_BASED_OUTPATIENT_CLINIC_OR_DEPARTMENT_OTHER)
Admission: RE | Admit: 2015-11-29 | Discharge: 2015-11-29 | Disposition: A | Discharge status: Home / Self Care | Payer: Medicare Other | Source: Ambulatory Visit | Attending: Physical Medicine & Rehabilitation | Admitting: Physical Medicine & Rehabilitation

## 2015-11-29 DIAGNOSIS — M48061 Spinal stenosis, lumbar region without neurogenic claudication: Secondary | ICD-10-CM | POA: Insufficient documentation

## 2015-11-29 DIAGNOSIS — M4810 Ankylosing hyperostosis [Forestier], site unspecified: Secondary | ICD-10-CM | POA: Insufficient documentation

## 2015-11-29 DIAGNOSIS — M5136 Other intervertebral disc degeneration, lumbar region: Secondary | ICD-10-CM | POA: Insufficient documentation

## 2015-11-29 DIAGNOSIS — M545 Low back pain: Secondary | ICD-10-CM | POA: Diagnosis not present

## 2015-12-02 ENCOUNTER — Ambulatory Visit: Payer: Self-pay | Admitting: Physical Medicine & Rehabilitation

## 2015-12-16 ENCOUNTER — Ambulatory Visit: Payer: Medicare Other

## 2015-12-16 ENCOUNTER — Ambulatory Visit: Payer: Self-pay | Admitting: Physical Medicine & Rehabilitation

## 2015-12-22 ENCOUNTER — Ambulatory Visit (INDEPENDENT_AMBULATORY_CARE_PROVIDER_SITE_OTHER): Payer: Medicare Other | Admitting: Student in an Organized Health Care Education/Training Program

## 2015-12-22 ENCOUNTER — Encounter: Payer: Self-pay | Admitting: Student in an Organized Health Care Education/Training Program

## 2015-12-22 DIAGNOSIS — J3089 Other allergic rhinitis: Secondary | ICD-10-CM

## 2015-12-22 DIAGNOSIS — Z87891 Personal history of nicotine dependence: Secondary | ICD-10-CM

## 2015-12-22 DIAGNOSIS — Z6841 Body Mass Index (BMI) 40.0 and over, adult: Secondary | ICD-10-CM

## 2015-12-22 DIAGNOSIS — Z9989 Dependence on other enabling machines and devices: Secondary | ICD-10-CM

## 2015-12-22 DIAGNOSIS — I10 Essential (primary) hypertension: Secondary | ICD-10-CM

## 2015-12-22 DIAGNOSIS — G4733 Obstructive sleep apnea (adult) (pediatric): Secondary | ICD-10-CM | POA: Diagnosis not present

## 2015-12-22 DIAGNOSIS — Z79899 Other long term (current) drug therapy: Secondary | ICD-10-CM

## 2015-12-22 DIAGNOSIS — J309 Allergic rhinitis, unspecified: Secondary | ICD-10-CM

## 2015-12-22 DIAGNOSIS — K219 Gastro-esophageal reflux disease without esophagitis: Secondary | ICD-10-CM

## 2015-12-22 DIAGNOSIS — M48061 Spinal stenosis, lumbar region without neurogenic claudication: Secondary | ICD-10-CM

## 2015-12-22 MED ORDER — LOSARTAN POTASSIUM 100 MG PO TABS: 100.0000 mg | ORAL_TABLET | Freq: Every day | ORAL | 3 refills | Status: DC

## 2015-12-22 NOTE — Assessment & Plan Note (Signed)
Good compliance with CPAP. Having difficulty with humidification through the mask. I think if he does better sinus irrigation and hygiene he will have better results with humidifying his nocturnal oxygen.

## 2015-12-22 NOTE — Patient Instructions (Signed)
BUFFERED ISOTONIC SALINE NASAL IRRIGATION  The Benefits:  1. When you irrigate, the isotonic saline (salt water) acts as a solvent and washes the mucus crusts and other debris from your nose.  2. This decongests and improves the airflow into your nose. The sinus passages begin to open.  3. Studies have also shown that a salt water and an alkaline (baking soda) irrigation solution improves nasal membrane cell function (mucociliary flow of mucus debris).  The Recipe:  1. Choose a 1-quart glass jar that is thoroughly cleansed.  2. Fill with sterile or distilled water, or you can boil water from the tap.  3. Add 1 to 2 heaping teaspoons of "pickling/canning/sea" salt (NOT table salt as it contains a large number of additives). This salt is available at the grocery store in the food canning section.  4. Add 1 teaspoon of Arm [employer] Hammer Baking Soda (pure bicarbonate).  5. Mix ingredients together and store at room temperature. Discard after one week. If you find this solution too strong, you may decrease the amount of salt added to 1 to 1  teaspoons. With children it is often best to start with a milder solution and advance slowly. Irrigate with 240 ml (8 oz) twice daily.  The Instructions:  You should plan to irrigate your nose with buffered isotonic saline 2 times per day. Many people prefer to warm the solution slightly in the microwave - but be sure that the solution is NOT HOT. Stand over the sink (some do this in the shower) and squirt the solution into each side of your nose, keeping your mouth open. This allows you to spit the saltwater out of your mouth. It will not harm you if you swallow a little.  If you have been told to use a nasal steroid such as Flonase, Nasonex, or Nasacort, you should always use isortonic saline solution first, then use your nasal steroid product. The nasal steroid is much more effective when sprayed onto clean nasal membranes and the steroid medicine will reach  deeper into the nose.  Most people experience a little burning sensation the first few times they use a isotonic saline solution, but this usually goes away within a few days.    

## 2015-12-22 NOTE — Progress Notes (Signed)
Assessment and Plan:  See Encounters tab for problem-based medical decision making.   __________________________________________________________  HPI:  55 year old man here for follow-up of hypertension. The patient reports good compliance with his medications at home without adverse side effect. Denies lightheadedness upon standing. He is to exercise frequently but has been unable to exercise recently because of pain associated with diffuse osteoarthritis, particularly pain in his lower back. He follows with [place] [facility] for this pain and uses NSAIDs every day. Has not had interventions like steroid injections to his vertebral facets. He reports that his weight continues to fluctuate's, between 320 pounds to 400 pounds. He reports eating a good diet with high quality calories. 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 Denies any chest pain or dyspnea with exertion. Good compliance with CPAP machine at night. Continues to endorse chronic sinus pain and pressure. He has chronic sinus congestion. He does saline sprays into his nose but does not do sinus irrigation, has never tried before.   __________________________________________________________  Problem List: Patient Active Problem List   Diagnosis Date Noted  . Morbid obesity ([place]) 11/22/2005    Priority: High  . Essential hypertension 11/22/2005    Priority: High    Class: Chronic  . Allergic rhinitis 12/23/2010    Priority: Medium  . Obstructive sleep apnea 04/03/2007    Priority: Medium  . Asthma 11/22/2005    Priority: Medium  . Spinal stenosis in cervical region 11/19/2013    Priority: Low  . Spinal stenosis in lumbar region 10/17/2008    Priority: Low  . GERD 06/23/2006    Priority: Low    Medications: Reconciled today in Epic __________________________________________________________  Physical Exam:  Vital  Signs: Vitals:   12/22/15 1035  BP: (!) 147/78  Pulse: 72  Temp: 97.9 F (36.6 C)  TempSrc: Oral  SpO2: 98%  Weight: (!) 391 lb 9.6 oz (177.6 kg)  Height: 6' 3.4" (1.915 m)    Gen: Obese man, sitting in a chair, appears comfortable ENT: OP clear without erythema or exudate.  CV: RRR, no murmurs Pulm: Normal effort, CTA throughout, no wheezing Ext: Warm, no edema, normal joints Skin: No atypical appearing moles. No rashes

## 2015-12-22 NOTE — Assessment & Plan Note (Signed)
Symptomatically poorly controlled. He uses a decongestant with cetirizine and reports limited benefit. I think his problem is that his sinus hygiene is inadequate. He is only using saline nasal sprays. I spent a lot of time counseling him to do full sinus irrigation twice daily. I gave him clear instructions and his after visit summary about conducting sinus irrigation. He does that I think we'll be able to use an intranasal steroid. But, I'm not sure he is going to be able to tolerate it. I'll follow-up at our asked visit

## 2015-12-22 NOTE — Assessment & Plan Note (Signed)
Weight today 391 pounds with a BMI of 48.5. Continues to struggle, he reports that his nutrition is overall high quality with very little sugar or saturated fat. He thinks biggest problem is lack of exercise caused by pain due to spinal stenosis and diffuse arthritis. I counseled him to really consider his nutrition as the main driver, I advised reduced calorie dieting.

## 2015-12-22 NOTE — Assessment & Plan Note (Signed)
Blood pressure is above goal today. He is very sensitive to medication adjustments, but I'm going to increase losartan from 50 mg to 100 mg once daily. Continue carvedilol 12.5 mg twice a day.

## 2016-01-01 DIAGNOSIS — G4733 Obstructive sleep apnea (adult) (pediatric): Secondary | ICD-10-CM | POA: Diagnosis not present

## 2016-02-28 ENCOUNTER — Other Ambulatory Visit: Payer: Self-pay | Admitting: Student in an Organized Health Care Education/Training Program

## 2016-03-01 ENCOUNTER — Other Ambulatory Visit: Payer: Self-pay | Admitting: Student in an Organized Health Care Education/Training Program
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
## 2016-03-12 ENCOUNTER — Ambulatory Visit: Payer: Self-pay | Admitting: Physical Medicine & Rehabilitation

## 2016-03-22 ENCOUNTER — Ambulatory Visit (HOSPITAL_BASED_OUTPATIENT_CLINIC_OR_DEPARTMENT_OTHER): Payer: Medicare Other | Admitting: Physical Medicine & Rehabilitation

## 2016-03-22 ENCOUNTER — Ambulatory Visit: Payer: Self-pay | Admitting: Physical Medicine & Rehabilitation

## 2016-03-22 ENCOUNTER — Encounter: Payer: Self-pay | Admitting: Physical Medicine & Rehabilitation

## 2016-03-22 ENCOUNTER — Encounter: Payer: Medicare Other | Attending: Physical Medicine & Rehabilitation

## 2016-03-22 VITALS — BP 169/94 | HR 85

## 2016-03-22 DIAGNOSIS — G8929 Other chronic pain: Secondary | ICD-10-CM | POA: Insufficient documentation

## 2016-03-22 DIAGNOSIS — K219 Gastro-esophageal reflux disease without esophagitis: Secondary | ICD-10-CM | POA: Insufficient documentation

## 2016-03-22 DIAGNOSIS — Z8041 Family history of malignant neoplasm of ovary: Secondary | ICD-10-CM | POA: Diagnosis not present

## 2016-03-22 DIAGNOSIS — Z800 Family history of malignant neoplasm of digestive organs: Secondary | ICD-10-CM | POA: Diagnosis not present

## 2016-03-22 DIAGNOSIS — Z809 Family history of malignant neoplasm, unspecified: Secondary | ICD-10-CM | POA: Diagnosis not present

## 2016-03-22 DIAGNOSIS — E785 Hyperlipidemia, unspecified: Secondary | ICD-10-CM | POA: Insufficient documentation

## 2016-03-22 DIAGNOSIS — Z833 Family history of diabetes mellitus: Secondary | ICD-10-CM | POA: Diagnosis not present

## 2016-03-22 DIAGNOSIS — Z8673 Personal history of transient ischemic attack (TIA), and cerebral infarction without residual deficits: Secondary | ICD-10-CM | POA: Insufficient documentation

## 2016-03-22 DIAGNOSIS — M48061 Spinal stenosis, lumbar region without neurogenic claudication: Secondary | ICD-10-CM | POA: Diagnosis not present

## 2016-03-22 DIAGNOSIS — I10 Essential (primary) hypertension: Secondary | ICD-10-CM | POA: Insufficient documentation

## 2016-03-22 DIAGNOSIS — Z820 Family history of epilepsy and other diseases of the nervous system: Secondary | ICD-10-CM | POA: Diagnosis not present

## 2016-03-22 DIAGNOSIS — G4733 Obstructive sleep apnea (adult) (pediatric): Secondary | ICD-10-CM | POA: Insufficient documentation

## 2016-03-22 DIAGNOSIS — Z9049 Acquired absence of other specified parts of digestive tract: Secondary | ICD-10-CM | POA: Diagnosis not present

## 2016-03-22 DIAGNOSIS — Z87891 Personal history of nicotine dependence: Secondary | ICD-10-CM | POA: Diagnosis not present

## 2016-03-22 DIAGNOSIS — E669 Obesity, unspecified: Secondary | ICD-10-CM | POA: Insufficient documentation

## 2016-03-22 DIAGNOSIS — Z823 Family history of stroke: Secondary | ICD-10-CM | POA: Diagnosis not present

## 2016-03-22 DIAGNOSIS — Z8249 Family history of ischemic heart disease and other diseases of the circulatory system: Secondary | ICD-10-CM | POA: Insufficient documentation

## 2016-03-22 DIAGNOSIS — M810 Age-related osteoporosis without current pathological fracture: Secondary | ICD-10-CM | POA: Diagnosis not present

## 2016-03-22 DIAGNOSIS — R51 Headache: Secondary | ICD-10-CM | POA: Insufficient documentation

## 2016-03-22 MED ORDER — MELOXICAM 15 MG PO TABS: 15.0000 mg | ORAL_TABLET | Freq: Every day | ORAL | 0 refills | Status: DC

## 2016-03-22 NOTE — Progress Notes (Signed)
Subjective:    Patient ID: Eduardo Duncan, male    DOB: 02/02/1960, 56 y.o.   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 Seen on MRI 11/29/2015 Also, C3-4, C4-5 central stenosis noted on MRI performed 01/20/2010.  Interval history: Episode of right-sided weakness and numbness including facial numbness onset while he was walking in his home. This lasted just a couple minutes. He called EMS and at the time they arrived, neuro exam was negative  Patient had previous workup for possible CVA in 2014 by neurology. MRI of the brain was negative. 2-D echo in 2010 was normal Patient reported normal, carotid Dopplers, but not seen in the [facility]  system, these may have been performed at [facility] neurology office Pain Inventory Average Pain 8 Pain Right Now 8 My pain is sharp, burning, tingling and aching  In the last 24 hours, has pain interfered with the following? General activity 7 Relation with others 7 Enjoyment of life 7 What TIME of day is your pain at its worst? . Sleep (in general) Poor  Pain is worse with: walking, bending, sitting, inactivity, standing and some activites Pain improves with: rest, heat/ice and medication Relief from Meds: 5  Mobility walk without assistance do you drive?  yes Do you have any goals in this area?  yes  Function disabled: date disabled 2003  Neuro/Psych weakness numbness tingling spasms dizziness depression anxiety  Prior Studies Any changes since last visit?  yes  Physicians involved in your care Any changes since last visit?  no   Family History  Problem Relation Age of Onset  . Diabetes Mother   . Hypertension Mother   . Alzheimer's disease Mother   . Heart disease Mother   . Heart disease Father   . Colon cancer Brother 14  . Stroke Brother     . Cancer Maternal Grandmother   . Ovarian cancer Paternal Grandmother   . Coronary artery disease Brother 41   Social History   Social History  . Marital status: Single    Spouse name: N/A  . Number of children: 0  . Years of education: trade   Occupational History  . disability  Disabled    10/2006--has not worked since 2004   Social History Main Topics  . Smoking status: Former Smoker    Packs/day: 1.00    Years: 12.00    Types: Cigarettes    Quit date: 03/13/1986  . Smokeless tobacco: Never Used  . Alcohol use No     Comment: No alcohol for 2 years--used to drink heavily in late teens  . Drug use: No  . Sexual activity: Not Asked   Other Topics Concern  . None   Social History Narrative   Registration notes: Red bag given 05/10/08.         Past Surgical History:  Procedure Laterality Date  . CHOLECYSTECTOMY  07/2009   Lap  . CHOLECYSTECTOMY  07/11/2009  . COLONOSCOPY  01/2009   Past Medical History:  Diagnosis Date  . Allergy   . Arthritis   . Asthma   . Cervicogenic headache 12/25/2012  . Depression   . Dizziness   . GERD (gastroesophageal reflux disease)   . Hyperlipidemia   . Hypertension   . Obesity   . Obstructive sleep apnea    On CPAP  . Osteoporosis   .  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 Spinal stenosis of lumbar region    BP (!) 169/94   Pulse 85   SpO2 97%   Opioid Risk Score:   Fall Risk Score:  `1  Depression screen PHQ 2/9  Depression screen [facility] 2/9 11/04/2015 05/27/2015 04/08/2014 03/14/2014 05/16/2013 03/20/2013 02/13/2013  Decreased Interest 0 0 0 0 0 0 0  Down, Depressed, Hopeless 0 0 1 1 0 0 0  PHQ - 2 Score 0 0 1 1 0 0 0    Review of Systems  HENT: Negative.   Eyes: Negative.   Respiratory: Negative.   Cardiovascular: Negative.   Gastrointestinal: Negative.   Endocrine: Negative.   Genitourinary: Negative.   Musculoskeletal: Positive for back pain.  Skin: Negative.   Allergic/Immunologic: Negative.   Neurological:  Positive for dizziness, weakness and numbness.  Hematological: Negative.   Psychiatric/Behavioral: Positive for dysphoric mood. The patient is nervous/anxious.        Objective:   Physical Exam  Constitutional: He is oriented to person, place, and time. He appears well-developed and well-nourished.  HENT:  Head: Normocephalic and atraumatic.  Eyes: Conjunctivae and EOM are normal. Pupils are equal, round, and reactive to light.  Neck:  Neck with reduced flexion, extension, lateral bending and rotation.  Neurological: He is alert and oriented to person, place, and time.  Skin: Skin is warm and dry.  Small pimple left-sided upper back, this appears to have been scratched, no surrounding erythema or drainage.  Psychiatric: He has a normal mood and affect.  Nursing note and vitals reviewed. Neuro:  Eyes without evidence of nystagmus  Tone is normal without evidence of spasticity Cerebellar exam shows no evidence of ataxia on finger nose finger or heel to shin testing No evidence of trunkal ataxia  Motor strength is 5/5 in bilateral deltoid, biceps, triceps, finger flexors and extensors, wrist flexors and extensors, hip flexors, knee flexors and extensors, ankle dorsiflexors, plantar flexors, invertors and evertors, toe flexors and extensors  Sensory exam is normal to pinprick,and light touch in the upper and lower limbs   Cranial nerves II- Visual fields are intact to confrontation testing, no blurring of vision III- no evidence of ptosis, upward, downward and medial gaze intact IV- no vertical diplopia or head tilt V- no facial numbness or masseter weakness VI- no pupil abduction weakness VII- no facial droop, good lid closure VII- normal auditory acuity IX- no pharygeal weakness, gag nl X- no pharyngeal weakness, no hoarseness XI- no trap or SCM weakness XII- no glossal weakness          Assessment & Plan:  1. Transient episode of right hemiparesis including facial  numbness. This occurred a couple weeks ago. He has a history of hypertension and morbid obesity. Prior stroke workup performed more than 3 years ago was negative. I would recommend he undergoes neurology reevaluation, sent referral to neurology to evaluate for TIA.  2. History of lumbar stenosis, mild, do not expect any lower extremity weakness as result of this diagnosis. History of cervical stenosis. Last MRI shows mild stenosis, does not appear to be myelopathic. Neuro exam is normal at this time  3. Chronic back pain. 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 He gets good relief from 600 mg of Motrin but is afraid he might develop some stomach problems. Will trial meloxicam 15 mg in place of Motrin  Patient can follow-up with [place] once  neuro evaluation is completed

## 2016-03-22 NOTE — Patient Instructions (Signed)
Try meloxicam 1 tablet per day in place of ibuprofen.  The neurology office should call you to schedule an appointment in a couple weeks

## 2016-04-20 ENCOUNTER — Telehealth: Payer: Self-pay | Admitting: Neurology

## 2016-04-20 NOTE — Telephone Encounter (Signed)
error 

## 2016-04-21 ENCOUNTER — Institutional Professional Consult (permissible substitution): Payer: Self-pay | Admitting: Neurology

## 2016-04-26 ENCOUNTER — Ambulatory Visit (INDEPENDENT_AMBULATORY_CARE_PROVIDER_SITE_OTHER): Payer: Medicare Other | Admitting: Student in an Organized Health Care Education/Training Program

## 2016-04-26 ENCOUNTER — Encounter: Payer: Self-pay | Admitting: Student in an Organized Health Care Education/Training Program

## 2016-04-26 VITALS — BP 140/70 | Temp 97.8°F | Ht 75.4 in | Wt 387.4 lb

## 2016-04-26 DIAGNOSIS — Z0000 Encounter for general adult medical examination without abnormal findings: Secondary | ICD-10-CM | POA: Insufficient documentation

## 2016-04-26 DIAGNOSIS — Z800 Family history of malignant neoplasm of digestive organs: Secondary | ICD-10-CM | POA: Diagnosis not present

## 2016-04-26 DIAGNOSIS — J309 Allergic rhinitis, unspecified: Secondary | ICD-10-CM

## 2016-04-26 DIAGNOSIS — D751 Secondary polycythemia: Secondary | ICD-10-CM | POA: Insufficient documentation

## 2016-04-26 DIAGNOSIS — Z862 Personal history of diseases of the blood and blood-forming organs and certain disorders involving the immune mechanism: Secondary | ICD-10-CM | POA: Diagnosis not present

## 2016-04-26 DIAGNOSIS — M4692 Unspecified inflammatory spondylopathy, cervical region: Secondary | ICD-10-CM

## 2016-04-26 DIAGNOSIS — I10 Essential (primary) hypertension: Secondary | ICD-10-CM

## 2016-04-26 DIAGNOSIS — M4696 Unspecified inflammatory spondylopathy, lumbar region: Secondary | ICD-10-CM | POA: Diagnosis not present

## 2016-04-26 DIAGNOSIS — Z87891 Personal history of nicotine dependence: Secondary | ICD-10-CM

## 2016-04-26 DIAGNOSIS — Z6841 Body Mass Index (BMI) 40.0 and over, adult: Secondary | ICD-10-CM

## 2016-04-26 DIAGNOSIS — K219 Gastro-esophageal reflux disease without esophagitis: Secondary | ICD-10-CM

## 2016-04-26 DIAGNOSIS — J3089 Other allergic rhinitis: Secondary | ICD-10-CM

## 2016-04-26 DIAGNOSIS — Z9181 History of falling: Secondary | ICD-10-CM

## 2016-04-26 DIAGNOSIS — Z23 Encounter for immunization: Secondary | ICD-10-CM

## 2016-04-26 NOTE — Progress Notes (Signed)
Assessment and Plan:  See Encounters tab for problem-based medical decision making.   __________________________________________________________  HPI:  56 year old man with severe lumbar and cervical arthritis here for follow-up of hypertension. Patient reports good compliance with antihypertensive medications with no adverse side effects. No lightheadedness, dizziness, angina, dyspnea with exertion, orthopnea, PND, fevers, or chills. He lives at home alone. He follows with several subspecialists, most recently saw Dr. Letta Pate for chronic back pain. He tried meloxicam for pain control but reported that it caused him to have lightheadedness and flushing. Now he is back to just using ibuprofen as needed. He describes one episode of sudden onset right sided weakness in his upper and lower extremity. Says this happened after he was having significant flare of his lower back pain. He had a fall at home around that same time. He said the weakness lasted for only a few minutes. He was evaluated by EMS at his house but then chose not to go to the emergency department. Dr. Dianna Limbo referred him to neurology for an evaluation for TIA. Patient does not think he had a TIA and canceled his initial appointment because of transportation difficulties. Currently reporting no further episodes of sudden onset weakness, paresthesias, or changes in his mentation.  Otherwise the patient is doing fairly well. He continues to have significant allergy drainage, says it is due to the recent pollen. He uses only sinus sprays and Flonase, did not initiate sinus irrigation as we discussed at her last visit.  __________________________________________________________  Problem List: Patient Active Problem List   Diagnosis Date Noted  . Morbid obesity ([place]) 11/22/2005    Priority: High  . Essential hypertension 11/22/2005    Priority: High    Class: Chronic  . Polycythemia 04/26/2016    Priority: Medium  . Allergic  rhinitis 12/23/2010    Priority: Medium  . Obstructive sleep apnea 04/03/2007    Priority: Medium  . Asthma 11/22/2005    Priority: Medium  . Spinal stenosis in cervical region 11/19/2013    Priority: Low  . Spinal stenosis in lumbar region 10/17/2008    Priority: Low  . GERD 06/23/2006    Priority: Low  . Encounter for preventive care 04/26/2016    Medications: Reconciled today in Epic __________________________________________________________  Physical Exam:  Vital Signs: Vitals:   04/26/16 1039  BP: 140/70  Temp: 97.8 F (36.6 C)  TempSrc: Oral  SpO2: 98%  Weight: (!) 387 lb 6.4 oz (175.7 kg)  Height: 6' 3.4" (1.915 m)    Gen: Well appearing, obese man, NAD CV: RRR, distant heart sounds, no murmurs Pulm: Normal effort, CTA throughout, no wheezing Abd: Soft, NT, ND, normal BS.  Ext: Warm, 1+ pitting bilateral edema Skin: No atypical appearing moles. No rashes

## 2016-04-26 NOTE — Patient Instructions (Signed)
1. Your blood pressure is well controlled. Continue current medications and I will check your blood work today.  2. I placed a new referral for you to obtain a colonoscopy given your family history of colorectal cancer. Let our office know if you have any difficulty.  3. For your sinuses please start taking Zyrtec again. Also please start using saline irrigation in her sinuses (Neti Pot) at least daily.

## 2016-04-26 NOTE — Assessment & Plan Note (Signed)
Weight is down from 391 to 387. BMI still well above 45. Patient reports that he eats a healthy diet, and that his difficulty is with inability to exercise because of his diffuse osteoarthritis and back pain. Given his sensitivity medications I don't think he would tolerate weight loss agents. We have not talked about weight reduction surgery yet which may be his only option.

## 2016-04-26 NOTE — Assessment & Plan Note (Signed)
I noted that the patient had a diagnosis of polycythemia vera in the chart since at least 2009. He has consistently had hemoglobin above 17 g with hematocrits around 48%. Never had JAK 2 testing done. Given that he had this recent possible TIA event, I think it's more important to make a definitive diagnosis of polycythemia or not. Plan to recheck CBC with differential today and check JAK2. If the JAK2 is positive, will refer to hematology for consideration of hydrea.

## 2016-04-26 NOTE — Assessment & Plan Note (Signed)
Reflux is symptomatically well controlled currently. Plan to continue famotidine.

## 2016-04-26 NOTE — Assessment & Plan Note (Signed)
Somatically stable. He uses saline nasal sprays, has been reluctant to do full saline sinus irrigation. I encouraged him to try irrigation especially now that the pollen is starting. I think the Flonase can be much more effective if he doesn't after an irrigation treatment than what is currently doing. Also advised that he restart cetirizine for antihistamine.

## 2016-04-26 NOTE — Assessment & Plan Note (Signed)
Blood pressure is well-controlled today. Plan to continue carvedilol and losartan. Check renal function today.

## 2016-04-26 NOTE — Assessment & Plan Note (Signed)
Due for hepatitis C screening. His last colonoscopy was in 2011 which was normal. His brother had colorectal cancer diagnosed around the age of 22. This places Eduardo Duncan at a high risk group, not appropriate for stool-based screening, should have colonoscopies every 5 years. I put in for referral to GI for repeat colonoscopy this year.

## 2016-04-27 ENCOUNTER — Encounter: Payer: Self-pay | Admitting: Student in an Organized Health Care Education/Training Program

## 2016-04-27 LAB — CBC WITH DIFFERENTIAL/PLATELET
Basophils Absolute: 0 10*3/uL (ref 0.0–0.2)
Basos: 0 %
EOS (ABSOLUTE): 0.3 10*3/uL (ref 0.0–0.4)
EOS: 4 %
HEMATOCRIT: 47.9 % (ref 37.5–51.0)
Hemoglobin: 16.3 g/dL (ref 13.0–17.7)
IMMATURE GRANULOCYTES: 0 %
Immature Grans (Abs): 0 10*3/uL (ref 0.0–0.1)
LYMPHS ABS: 2.7 10*3/uL (ref 0.7–3.1)
Lymphs: 38 %
MCH: 29.5 pg (ref 26.6–33.0)
MCHC: 34 g/dL (ref 31.5–35.7)
MCV: 87 fL (ref 79–97)
MONOS ABS: 0.5 10*3/uL (ref 0.1–0.9)
Monocytes: 7 %
NEUTROS PCT: 51 %
Neutrophils Absolute: 3.5 10*3/uL (ref 1.4–7.0)
PLATELETS: 218 10*3/uL (ref 150–379)
RBC: 5.53 x10E6/uL (ref 4.14–5.80)
RDW: 14.2 % (ref 12.3–15.4)
WBC: 7 10*3/uL (ref 3.4–10.8)

## 2016-04-27 LAB — BMP8+ANION GAP
ANION GAP: 17 mmol/L (ref 10.0–18.0)
BUN / CREAT RATIO: 12 (ref 9–20)
BUN: 12 mg/dL (ref 6–24)
CO2: 21 mmol/L (ref 18–29)
Calcium: 9.1 mg/dL (ref 8.7–10.2)
Chloride: 104 mmol/L (ref 96–106)
Creatinine, Ser: 1 mg/dL (ref 0.76–1.27)
GFR calc Af Amer: 96 mL/min/{1.73_m2} (ref >59)
GFR, EST NON AFRICAN AMERICAN: 83 mL/min/{1.73_m2} (ref >59)
Glucose: 100 mg/dL — ABNORMAL HIGH (ref 65–99)
POTASSIUM: 3.8 mmol/L (ref 3.5–5.2)
SODIUM: 142 mmol/L (ref 134–144)

## 2016-04-27 LAB — HEPATITIS C ANTIBODY: Hep C Virus Ab: <0.1 s/co ratio (ref 0.0–0.9)

## 2016-04-28 DIAGNOSIS — G4733 Obstructive sleep apnea (adult) (pediatric): Secondary | ICD-10-CM | POA: Diagnosis not present

## 2016-05-06 ENCOUNTER — Encounter: Payer: Self-pay | Admitting: Neurology

## 2016-05-06 ENCOUNTER — Ambulatory Visit (INDEPENDENT_AMBULATORY_CARE_PROVIDER_SITE_OTHER): Payer: Medicare Other | Admitting: Neurology

## 2016-05-06 VITALS — BP 150/89 | HR 69 | Ht 75.4 in | Wt 383.0 lb

## 2016-05-06 DIAGNOSIS — G451 Carotid artery syndrome (hemispheric): Secondary | ICD-10-CM

## 2016-05-06 MED ORDER — ALPRAZOLAM 0.5 MG PO TABS: ORAL_TABLET | ORAL | 0 refills | Status: DC

## 2016-05-06 NOTE — Patient Instructions (Signed)
   We will get MRI of the brain and get 2D echo of the heart and a carotid doppler of the neck. Stay on the aspirin for now.

## 2016-05-06 NOTE — Progress Notes (Signed)
Reason for visit: Possible TIA  Referring physician: Dr. Lu Duffel is a 56 y.o. male  History of present illness:  Mr. Winker is a 56 year old right-handed white male with a history of obesity, hypertension, and dyslipidemia. The patient had an event in the first week of March 2018 while he was at home. The patient had a very sudden onset of right leg and right arm weakness that came on while standing, his right leg collapsed and he had to hold onto a counter top to remain standing. The patient indicates that the event lasted only about 30-45 seconds. He does not recall having any visual field changes or any changes in speech. He called EMS, but by the time EMS arrived the entire episode was over. The patient denied any palpitations of the heart or chest pain. He had no headache or dizziness with the event. The patient has not had any recurrence of the same events since that time. He has been taking low-dose aspirin for several years. He is sent to this office for an evaluation of a possible TIA episode.   Past Medical History:  Diagnosis Date  . Asthma   . Cervicogenic headache 12/25/2012  . Depression   . Hyperlipidemia   . Hypertension   . Obesity   . Obstructive sleep apnea    On CPAP  . 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 Spinal stenosis of lumbar region     Past Surgical History:  Procedure Laterality Date  . CHOLECYSTECTOMY  07/2009   Lap  . CHOLECYSTECTOMY  07/11/2009  . COLONOSCOPY  01/2009    Family History  Problem Relation Age of Onset  . Diabetes Mother   . Hypertension Mother   . Alzheimer's disease Mother   . Heart disease Mother   . Heart disease Father   . Colon cancer Brother 74  . Stroke Brother   . Cancer Maternal Grandmother   . Ovarian cancer Paternal Grandmother   . Coronary artery disease Brother 3    Social history:  reports that he quit smoking about 30 years ago. His smoking use included Cigarettes. He has a 12.00  pack-year smoking history. He has never used smokeless tobacco. He reports that he does not drink alcohol or use drugs.  Medications:  Prior to Admission medications   Medication Sig Start Date End Date Taking? Authorizing Provider  acetaminophen (TYLENOL) 500 MG tablet Take 500 mg by mouth every 6 (six) hours as needed.   Yes Historical Provider, MD  albuterol (PROAIR HFA) 108 (90 Base) MCG/ACT inhaler Inhale 2 puffs into the lungs every 6 (six) hours as needed for wheezing or shortness of breath. 05/27/15  Yes Corky Sox, MD  aspirin (ASPIRIN ADULT LOW STRENGTH) 81 MG EC tablet Take 81 mg by mouth daily.     Yes Historical Provider, MD  carvedilol (COREG) 12.5 MG tablet Take 1 tablet (12.5 mg total) by mouth 2 (two) times daily with a meal. 05/27/15  Yes Corky Sox, MD  cetirizine (ZYRTEC) 10 MG tablet Take 10 mg by mouth daily.   Yes Historical Provider, MD  famotidine (PEPCID) 20 MG tablet Take 0.5 tablets (10 mg total) by mouth 2 (two) times daily. 08/16/15  Yes Axel Filler, MD  ibuprofen (ADVIL,MOTRIN) 200 MG tablet Take 600 mg by mouth 3 (three) times daily as needed.    Yes Historical Provider, MD  losartan (COZAAR) 100 MG tablet Take 1 tablet (100 mg total) by mouth  daily. 12/22/15  Yes Axel Filler, MD  simvastatin (ZOCOR) 40 MG tablet TAKE ONE TABLET BY MOUTH AT BEDTIME 11/27/15  Yes Axel Filler, MD      Allergies  Allergen Reactions  . Ace Inhibitors Cough  . Advair Diskus [Fluticasone-Salmeterol] Other (See Comments)    Confusion and paranoid  . Codeine Nausea Only    REACTION: nausea  . Gabapentin Other (See Comments)    Facial and left shoulder numbness  . Montelukast Sodium Other (See Comments)    Confusion and paranoid  . Prednisone Other (See Comments)    Confusion and paranoid  . Prednisolone Anxiety    ROS:  Out of a complete 14 system review of symptoms, the patient complains only of the following symptoms, and all other reviewed  systems are negative.  Transient weakness Low back pain  Blood pressure (!) 150/89, pulse 69, height 6' 3.4" (1.915 m), weight (!) 383 lb (173.7 kg).  Physical Exam  General: The patient is alert and cooperative at the time of the examination. The patient is markedly obese.  Eyes: Pupils are equal, round, and reactive to light. Discs are flat bilaterally.  Neck: The neck is supple, no carotid bruits are noted.  Respiratory: The respiratory examination is clear.  Cardiovascular: The cardiovascular examination reveals a regular rate and rhythm, no obvious murmurs or rubs are noted.  Skin: Extremities are with 2+ edema below the knees bilaterally.  Neurologic Exam  Mental status: The patient is alert and oriented x 3 at the time of the examination. The patient has apparent normal recent and remote memory, with an apparently normal attention span and concentration ability.  Cranial nerves: Facial symmetry is present. There is good sensation of the face to pinprick and soft touch bilaterally. The strength of the facial muscles and the muscles to head turning and shoulder shrug are normal bilaterally. Speech is well enunciated, no aphasia or dysarthria is noted. Extraocular movements are full. Visual fields are full. The tongue is midline, and the patient has symmetric elevation of the soft palate. No obvious hearing deficits are noted.  Motor: The motor testing reveals 5 over 5 strength of all 4 extremities. Good symmetric motor tone is noted throughout.  Sensory: Sensory testing is intact to pinprick, soft touch, vibration sensation, and position sense on all 4 extremities, with exception of some decreased pinprick sensation on the right leg as compared to the left. No evidence of extinction is noted.  Coordination: Cerebellar testing reveals good finger-nose-finger and heel-to-shin bilaterally.  Gait and station: Gait is wide-based. Tandem gait is unsteady. Romberg is negative. No drift  is seen.  Reflexes: Deep tendon reflexes are symmetric, but are depressed bilaterally. Toes are downgoing bilaterally.   Assessment/Plan:  1. Transient right-sided weakness, possible TIA  The patient does have risk factors for stroke including hypertension, dyslipidemia, obesity, and sleep apnea. The patient is on low-dose aspirin, he will remain on this. The patient will be set up for MRI of the brain, carotid Doppler study, and a 2-D echocardiogram. I will contact him concerning the results of the above.  Jill Alexanders MD 05/06/2016 2:10 PM  [facility] [address] [place], [place] [address]  Phone [phone] Fax [phone]

## 2016-05-07 LAB — JAK2  V617F QUAL. WITH REFLEX TO EXON 12

## 2016-05-07 LAB — JAK2 EXONS 12-15

## 2016-05-21 ENCOUNTER — Ambulatory Visit
Admission: RE | Admit: 2016-05-21 | Discharge: 2016-05-21 | Disposition: A | Discharge status: Home / Self Care | Payer: Medicare Other | Source: Ambulatory Visit | Attending: Neurology | Admitting: Neurology

## 2016-05-21 DIAGNOSIS — G459 Transient cerebral ischemic attack, unspecified: Secondary | ICD-10-CM | POA: Diagnosis not present

## 2016-05-21 DIAGNOSIS — G451 Carotid artery syndrome (hemispheric): Secondary | ICD-10-CM | POA: Diagnosis not present

## 2016-05-23 ENCOUNTER — Telehealth: Payer: Self-pay | Admitting: Neurology

## 2016-05-23 NOTE — Telephone Encounter (Signed)
  I called the patient. The MRI of the brain is normal. Carotid doppler is pending.  MRI brain 05/21/16:  IMPRESSION:  Unremarkable MRI scan the brain without contrast. Incidental mild chronic paranasal sinus inflammatory changes and noted.

## 2016-05-26 ENCOUNTER — Ambulatory Visit (HOSPITAL_COMMUNITY): Payer: Medicare Other | Attending: Cardiovascular Disease

## 2016-05-26 ENCOUNTER — Ambulatory Visit (HOSPITAL_COMMUNITY)
Admission: RE | Admit: 2016-05-26 | Discharge: 2016-05-26 | Disposition: A | Discharge status: Home / Self Care | Payer: Medicare Other | Source: Ambulatory Visit | Attending: Cardiovascular Disease | Admitting: Cardiovascular Disease

## 2016-05-26 ENCOUNTER — Other Ambulatory Visit: Payer: Self-pay

## 2016-05-26 ENCOUNTER — Telehealth: Payer: Self-pay | Admitting: Neurology

## 2016-05-26 DIAGNOSIS — E785 Hyperlipidemia, unspecified: Secondary | ICD-10-CM | POA: Insufficient documentation

## 2016-05-26 DIAGNOSIS — I10 Essential (primary) hypertension: Secondary | ICD-10-CM | POA: Diagnosis not present

## 2016-05-26 DIAGNOSIS — G451 Carotid artery syndrome (hemispheric): Secondary | ICD-10-CM | POA: Insufficient documentation

## 2016-05-26 NOTE — Telephone Encounter (Signed)
I called the patient. The doppler study is normal, etiology of the TIA event is not clear. Work up is completely negative.

## 2016-05-26 NOTE — Telephone Encounter (Signed)
  I called patient. The 2-D echocardiogram was unremarkable, carotid Doppler study is pending.  2D echo 05/26/16:  Study Conclusions  - Left ventricle: The cavity size was normal. Systolic function was normal. The estimated ejection fraction was in the range of 55% to 60%. Wall motion was normal; there were no regional wall motion abnormalities. Left ventricular diastolic function parameters were normal. - Left atrium: The atrium was mildly dilated. - Atrial septum: No defect or patent foramen ovale was identified.

## 2016-08-05 ENCOUNTER — Other Ambulatory Visit: Payer: Self-pay | Admitting: Student in an Organized Health Care Education/Training Program

## 2016-08-25 DIAGNOSIS — G4733 Obstructive sleep apnea (adult) (pediatric): Secondary | ICD-10-CM | POA: Diagnosis not present

## 2016-09-06 DIAGNOSIS — G4733 Obstructive sleep apnea (adult) (pediatric): Secondary | ICD-10-CM | POA: Diagnosis not present

## 2016-11-22 ENCOUNTER — Ambulatory Visit (INDEPENDENT_AMBULATORY_CARE_PROVIDER_SITE_OTHER): Payer: Medicare Other | Admitting: Student in an Organized Health Care Education/Training Program

## 2016-11-22 ENCOUNTER — Other Ambulatory Visit: Payer: Self-pay

## 2016-11-22 ENCOUNTER — Encounter: Payer: Self-pay | Admitting: Student in an Organized Health Care Education/Training Program

## 2016-11-22 VITALS — BP 144/90 | HR 79 | Temp 97.6°F | Wt 377.0 lb

## 2016-11-22 DIAGNOSIS — E785 Hyperlipidemia, unspecified: Secondary | ICD-10-CM

## 2016-11-22 DIAGNOSIS — Z6841 Body Mass Index (BMI) 40.0 and over, adult: Secondary | ICD-10-CM | POA: Diagnosis not present

## 2016-11-22 DIAGNOSIS — J45909 Unspecified asthma, uncomplicated: Secondary | ICD-10-CM | POA: Diagnosis not present

## 2016-11-22 DIAGNOSIS — J3089 Other allergic rhinitis: Secondary | ICD-10-CM

## 2016-11-22 DIAGNOSIS — I10 Essential (primary) hypertension: Secondary | ICD-10-CM | POA: Diagnosis not present

## 2016-11-22 DIAGNOSIS — G4733 Obstructive sleep apnea (adult) (pediatric): Secondary | ICD-10-CM

## 2016-11-22 DIAGNOSIS — Z0000 Encounter for general adult medical examination without abnormal findings: Secondary | ICD-10-CM

## 2016-11-22 MED ORDER — CARVEDILOL 12.5 MG PO TABS: 12.5000 mg | ORAL_TABLET | Freq: Two times a day (BID) | ORAL | 1 refills | Status: DC

## 2016-11-22 MED ORDER — LOSARTAN POTASSIUM 100 MG PO TABS: 100.0000 mg | ORAL_TABLET | Freq: Every day | ORAL | 3 refills | Status: DC

## 2016-11-22 MED ORDER — SIMVASTATIN 40 MG PO TABS: 40.0000 mg | ORAL_TABLET | Freq: Every day | ORAL | 3 refills | Status: DC

## 2016-11-22 MED ORDER — ALBUTEROL SULFATE HFA 108 (90 BASE) MCG/ACT IN AERS: 2.0000 | INHALATION_SPRAY | Freq: Four times a day (QID) | RESPIRATORY_TRACT | 5 refills | Status: DC | PRN

## 2016-11-22 NOTE — Assessment & Plan Note (Signed)
Patient with a history of reactive airway disease, probably asthma, previously followed by pulmonology.  Using albuterol inhaler 3-4 times weekly.  This probably moved him up into persistent moderate asthma.  I offered him an inhaled corticosteroid as stepup therapy, but he declined saying that in the past inhaled steroids have made him agitated.  Plan to continue with albuterol as needed for now.  Follow-up with allergy testing referral.

## 2016-11-22 NOTE — Assessment & Plan Note (Addendum)
Patient with obstructive sleep apnea, last sleep study February 2009.  He is doing well with CPAP at home, 13 mm of water pressure and no supplemental oxygen.  He asked for a new prescription for a CPAP machine so he can get an updated machine which I think is fine.

## 2016-11-22 NOTE — Assessment & Plan Note (Signed)
Weight of 377 today, BMI 46.  Down 10 pounds in 6 months.  I congratulated him on this and encouraged further diet and exercise as tolerated.

## 2016-11-22 NOTE — Patient Instructions (Signed)
1. Keep taking your medications as prescribed.   2. I am referring you to Allergy and Immunology for allergy testing and to talk with you about new options for managing your asthma and sinus problems.

## 2016-11-22 NOTE — Assessment & Plan Note (Signed)
Blood pressure is mildly above goal today.  He tells me that his home readings are all within goal, consistently below systolic 390.  Potentially he has an element of whitecoat hypertension.  He is also very sensitive to medication changes.  Plan is to continue with losartan 100 mg and carvedilol 12.5 mg twice daily.

## 2016-11-22 NOTE — Progress Notes (Signed)
   Assessment and Plan:  See Encounters tab for problem-based medical decision making.   __________________________________________________________  HPI:   56 year old man here for follow-up of hypertension.  He is doing fairly well at home, good compliance with medications.  He checks his blood pressure several times a day and says that they all range between 938 and 182 systolic.  He has had no problems with hypotension, lightheadedness, or dizziness.  He reports trying to improve his diet recently, drinking lots of water for appetite suppression.  Tries to walk around daily, having some increased right knee pain as a result.  Says that his back pain is also hurting more because of the cold weather.  Having some cramping in his left hand which also happens intermittently with cold weather and he thinks is related to his cervical spine disease.  Reports that his allergies are getting worse.  He says he is using his albuterol more frequently, about every 2-3 days now.  He thinks it is because of mold in his house.  Says that his basement often floods when it rains.  He reports good compliance with sinus irrigation, intranasal steroids, and antihistamines.  Denies any chest pain, fevers, chills, dyspnea on exertion.  Reports good compliance with CPAP at night.  Lives by himself, is independent in all activities of daily living.  __________________________________________________________  Problem List: Patient Active Problem List   Diagnosis Date Noted  . Morbid obesity ([place]) 11/22/2005    Priority: High  . Essential hypertension 11/22/2005    Priority: High    Class: Chronic  . Allergic rhinitis 12/23/2010    Priority: Medium  . Obstructive sleep apnea 04/03/2007    Priority: Medium  . Asthma 11/22/2005    Priority: Medium  . Encounter for preventive care 04/26/2016    Priority: Low  . Spinal stenosis in cervical region 11/19/2013    Priority: Low  . Spinal stenosis in lumbar region  10/17/2008    Priority: Low  . GERD 06/23/2006    Priority: Low    Medications: Reconciled today in Epic __________________________________________________________  Physical Exam:  Vital Signs: Vitals:   11/22/16 1129 11/22/16 1157  BP: (!) 151/87 (!) 144/90  Pulse: 75 79  Temp: 97.6 F (36.4 C)   TempSrc: Oral   SpO2: 100%   Weight: (!) 377 lb (171 kg)     Gen: Well appearing, NAD ENT: OP clear without erythema or exudate. Nasal turbinates appear normal to me.  CV: RRR, distant heart sounds. Pulm: Normal effort, CTA throughout, no wheezing Abd: Soft, NT, ND. Ext: Warm, no edema, a few [place] nodes on left hand DIPs.

## 2016-11-22 NOTE — Assessment & Plan Note (Signed)
Patient complains of persistent sinus disease including stuffiness and nasal drainage.  He is compliant with medical therapy including daily sinus irrigation, intranasal steroids, and an antihistamine.  We talked about potential for ENT evaluation, maybe sinus surgery would help his symptoms, and also allergy testing given likely environmental factors.  Plan is for allergy referral, continue with current medical therapy, follow-up results of allergy testing.

## 2016-11-26 ENCOUNTER — Encounter: Payer: Self-pay | Admitting: *Deleted

## 2016-12-13 DIAGNOSIS — G4733 Obstructive sleep apnea (adult) (pediatric): Secondary | ICD-10-CM | POA: Diagnosis not present

## 2016-12-13 DIAGNOSIS — G4730 Sleep apnea, unspecified: Secondary | ICD-10-CM | POA: Diagnosis not present

## 2016-12-14 DIAGNOSIS — Z87891 Personal history of nicotine dependence: Secondary | ICD-10-CM | POA: Diagnosis not present

## 2016-12-14 DIAGNOSIS — J310 Chronic rhinitis: Secondary | ICD-10-CM | POA: Diagnosis not present

## 2016-12-14 DIAGNOSIS — R942 Abnormal results of pulmonary function studies: Secondary | ICD-10-CM | POA: Diagnosis not present

## 2016-12-14 DIAGNOSIS — R062 Wheezing: Secondary | ICD-10-CM | POA: Diagnosis not present

## 2016-12-14 DIAGNOSIS — R05 Cough: Secondary | ICD-10-CM | POA: Diagnosis not present

## 2016-12-17 DIAGNOSIS — G4733 Obstructive sleep apnea (adult) (pediatric): Secondary | ICD-10-CM | POA: Diagnosis not present

## 2017-01-13 DIAGNOSIS — G4733 Obstructive sleep apnea (adult) (pediatric): Secondary | ICD-10-CM | POA: Diagnosis not present

## 2017-02-13 DIAGNOSIS — G4733 Obstructive sleep apnea (adult) (pediatric): Secondary | ICD-10-CM | POA: Diagnosis not present

## 2017-02-15 DIAGNOSIS — J310 Chronic rhinitis: Secondary | ICD-10-CM | POA: Diagnosis not present

## 2017-02-15 DIAGNOSIS — K219 Gastro-esophageal reflux disease without esophagitis: Secondary | ICD-10-CM | POA: Diagnosis not present

## 2017-02-15 DIAGNOSIS — J4520 Mild intermittent asthma, uncomplicated: Secondary | ICD-10-CM | POA: Diagnosis not present

## 2017-02-25 ENCOUNTER — Ambulatory Visit (INDEPENDENT_AMBULATORY_CARE_PROVIDER_SITE_OTHER): Payer: Medicare Other | Admitting: Internal Medicine

## 2017-02-25 ENCOUNTER — Other Ambulatory Visit: Payer: Self-pay

## 2017-02-25 VITALS — BP 158/86 | HR 80 | Temp 98.2°F | Ht 75.0 in | Wt 378.4 lb

## 2017-02-25 DIAGNOSIS — J320 Chronic maxillary sinusitis: Secondary | ICD-10-CM | POA: Diagnosis not present

## 2017-02-25 DIAGNOSIS — Z87891 Personal history of nicotine dependence: Secondary | ICD-10-CM | POA: Diagnosis not present

## 2017-02-25 DIAGNOSIS — R05 Cough: Secondary | ICD-10-CM | POA: Diagnosis not present

## 2017-02-25 DIAGNOSIS — M797 Fibromyalgia: Secondary | ICD-10-CM | POA: Diagnosis not present

## 2017-02-25 DIAGNOSIS — Z79899 Other long term (current) drug therapy: Secondary | ICD-10-CM

## 2017-02-25 DIAGNOSIS — M1990 Unspecified osteoarthritis, unspecified site: Secondary | ICD-10-CM

## 2017-02-25 DIAGNOSIS — R5382 Chronic fatigue, unspecified: Secondary | ICD-10-CM | POA: Diagnosis not present

## 2017-02-25 DIAGNOSIS — R059 Cough, unspecified: Secondary | ICD-10-CM

## 2017-02-25 DIAGNOSIS — K219 Gastro-esophageal reflux disease without esophagitis: Secondary | ICD-10-CM

## 2017-02-25 MED ORDER — PANTOPRAZOLE SODIUM 40 MG PO TBEC: 40.0000 mg | DELAYED_RELEASE_TABLET | Freq: Every day | ORAL | 1 refills | Status: DC

## 2017-02-25 NOTE — Patient Instructions (Signed)
Mr. Markey it was nice meeting you today.  Your cough seems to be due to a viral upper respiratory tract infection and also severe heartburn.  Take Protonix 40 mg once daily.  Continue taking Pepcid twice daily.  FOLLOW-UP INSTRUCTIONS When: March 14, 2017 at 10:45 AM For: Follow-up appointment with Dr. Evette Doffing What to bring: Medications  Please call us if sooner if you start having fevers or chills.

## 2017-02-25 NOTE — Progress Notes (Signed)
   CC: Cough  HPI:  Eduardo Duncan is a 57 y.o. male with a past medical history of conditions listed below presenting to the clinic complaining of a cough. Please see problem based charting for the status of the patient's current and chronic medical conditions.   Past Medical History:  Diagnosis Date  . Asthma   . Cervicogenic headache 12/25/2012  . Depression   . Hyperlipidemia   . Hypertension   . Obesity   . Obstructive sleep apnea    On CPAP  . 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 Spinal stenosis of lumbar region    Review of Systems: Pertinent positives mentioned in HPI. Remainder of all ROS negative.   Physical Exam:  Vitals:   02/25/17 1520  BP: (!) 158/86  Pulse: 80  Temp: 98.2 F (36.8 C)  TempSrc: Oral  SpO2: 98%  Weight: (!) 378 lb 6.4 oz (171.6 kg)  Height: 6\' 3"  (1.905 m)   Physical Exam  Constitutional: He is oriented to person, place, and time. He appears well-developed and well-nourished. No distress.  HENT:  Head: Normocephalic and atraumatic.  Mouth/Throat: Oropharynx is clear and moist. No oropharyngeal exudate.  Right maxillary sinus mildly tender to palpation (chronic per patient).  No tenderness on palpation of remaining sinuses.   Eyes: Right eye exhibits no discharge. Left eye exhibits no discharge.  Neck: Neck supple. No tracheal deviation present.  Cardiovascular: Normal rate, regular rhythm and intact distal pulses.  Pulmonary/Chest: Effort normal and breath sounds normal. No respiratory distress. He has no wheezes. He has no rales.  Abdominal: Soft. Bowel sounds are normal. He exhibits no distension. There is no tenderness.  Neurological: He is alert and oriented to person, place, and time.  Skin: Skin is warm and dry.    Assessment & Plan:   See Encounters Tab for problem based charting.  Patient discussed with Dr. Daryll Drown

## 2017-02-25 NOTE — Assessment & Plan Note (Signed)
Patient is presenting with a one-week history of a cough productive of minimal amounts of yellow colored sputum after being exposed to sick family members.  States the cough was getting better but he experienced 2 episodes of coughing up "pencil eraser" sized mucus mixed with blood last night prompted him to make this appointment.  He has not had any further episodes of blood-tinged sputum since then. Denies having gross hematemesis.  Denies having any fevers, chills, or night sweats.  Denies having any shortness of breath or sore throat.  Denies having any nausea, vomiting, or diarrhea.  States he was wheezing earlier today.  He has not been using his albuterol inhaler much for the past 1 week. He has been experiencing a little bit of chest pain only when coughing.  Reports having chronic sinus congestion and right maxillary sinus tenderness.  Nasal secretions are clear to yellow in color. He was seen by an allergy specialist in [facility] on February 5 and prescribed ipratropium nasal spray.  He is also currently using Flonase and Zyrtec.  States he has chronic fatigue and body aches secondary to arthritis and fibromyalgia; no recent change.  He does report having severe heartburn despite taking Pepcid 10 mg twice daily.  States he coughs whenever he experiences heartburn.  He is a former smoker; quit 30 years ago.  Currently not on an ACE inhibitor.  Exam, he is afebrile and lungs are clear to auscultation. I explained to him that his cough is likely secondary to a viral URI and uncontrolled GERD.  Plan -Symptomatic management for viral URI -Continue Flonase and Zyrtec -Trial of PPI (Protonix 40 mg daily) for 4-6 weeks -Continue Pepcid -Reevaluate symptoms at follow-up visit

## 2017-02-26 NOTE — Progress Notes (Signed)
Internal Medicine Clinic Attending  Case discussed with Dr. Rathoreat the time of the visit. We reviewed the resident's history and exam and pertinent patient test results. I agree with the assessment, diagnosis, and plan of care documented in the resident's note.  

## 2017-03-13 DIAGNOSIS — G4733 Obstructive sleep apnea (adult) (pediatric): Secondary | ICD-10-CM | POA: Diagnosis not present

## 2017-03-14 ENCOUNTER — Encounter: Payer: Medicare Other | Admitting: Student in an Organized Health Care Education/Training Program

## 2017-03-28 ENCOUNTER — Encounter: Payer: Self-pay | Admitting: Student in an Organized Health Care Education/Training Program

## 2017-03-28 ENCOUNTER — Ambulatory Visit (INDEPENDENT_AMBULATORY_CARE_PROVIDER_SITE_OTHER): Payer: Medicare Other | Admitting: Student in an Organized Health Care Education/Training Program

## 2017-03-28 ENCOUNTER — Other Ambulatory Visit: Payer: Self-pay

## 2017-03-28 VITALS — BP 145/93 | HR 73 | Temp 97.9°F | Ht 75.0 in | Wt 380.9 lb

## 2017-03-28 DIAGNOSIS — G4733 Obstructive sleep apnea (adult) (pediatric): Secondary | ICD-10-CM | POA: Diagnosis not present

## 2017-03-28 DIAGNOSIS — I10 Essential (primary) hypertension: Secondary | ICD-10-CM | POA: Diagnosis not present

## 2017-03-28 DIAGNOSIS — M545 Low back pain: Secondary | ICD-10-CM

## 2017-03-28 DIAGNOSIS — Z9989 Dependence on other enabling machines and devices: Secondary | ICD-10-CM | POA: Diagnosis not present

## 2017-03-28 DIAGNOSIS — Z79899 Other long term (current) drug therapy: Secondary | ICD-10-CM | POA: Diagnosis not present

## 2017-03-28 DIAGNOSIS — G729 Myopathy, unspecified: Secondary | ICD-10-CM

## 2017-03-28 DIAGNOSIS — M4802 Spinal stenosis, cervical region: Secondary | ICD-10-CM

## 2017-03-28 DIAGNOSIS — Z6841 Body Mass Index (BMI) 40.0 and over, adult: Secondary | ICD-10-CM | POA: Diagnosis not present

## 2017-03-28 DIAGNOSIS — J45909 Unspecified asthma, uncomplicated: Secondary | ICD-10-CM

## 2017-03-28 NOTE — Assessment & Plan Note (Signed)
Blood pressure overall is near goal, little higher than I would like to see it.  He does ambulatory blood pressure readings that are better, says systolics usually in the 295F when he checks at a pharmacy.  Plan is to continue with losartan and carvedilol for now.  He is very sensitive to medication side effects, if blood pressures elevated in the future we will have to be very careful with selecting a third agent.

## 2017-03-28 NOTE — Progress Notes (Signed)
   Assessment and Plan:  See Encounters tab for problem-based medical decision making.   __________________________________________________________  HPI:   57 year old man here for follow-up of obstructive sleep apnea.  Patient has OSA for about 10 years and has good compliance with CPAP at nights.  We looked over his compliance report which was faxed to our office in December.  They recently upgraded his machine to a newer model and he says that they need our office notes [facility].  I asked him if they wanted a new sleep study and he says no.  He reports sleeping well, denies significant daytime drowsiness.  He has a few other acute complaints today, he is having increasing tenderness and soreness in his bilateral shoulders and biceps.  Says sometimes this causes weakness.  He reports that his neck pain is about stable but he is been having increasing lower back pain recently.  It radiates to his right flank and sometimes down his legs.  As a result he is been very sedentary over the last few months.  He followed up with [facility] about his asthma, he reports that they said his allergy testing was all negative and encouraged him to continue his current regimen.  He had a upper respiratory tract infection a few months ago with a chronic cough and was prescribed pantoprazole by the [facility] resident, however he was unable to tolerate this medication and so has been taking Pepcid but has resolution of the cough.  He reports having a change in urine odor and color, this has resolved on its own, and he denies any dysuria or fevers.  No other recent illnesses, hospitalizations.  No chest pain or pressure, denies dyspnea on exertion or orthopnea.  Lives by himself, independent in his activities of daily living.  Has transportation issues because his truck recently, he thinks is a head gasket issue.  __________________________________________________________  Problem  List: Patient Active Problem List   Diagnosis Date Noted  . Morbid obesity ([place]) 11/22/2005    Priority: High  . Essential hypertension 11/22/2005    Priority: High    Class: Chronic  . Allergic rhinitis 12/23/2010    Priority: Medium  . Obstructive sleep apnea 04/03/2007    Priority: Medium  . Asthma 11/22/2005    Priority: Medium  . Encounter for preventive care 04/26/2016    Priority: Low  . Spinal stenosis in cervical region 11/19/2013    Priority: Low  . Spinal stenosis in lumbar region 10/17/2008    Priority: Low  . GERD 06/23/2006    Priority: Low  . Myopathy 03/28/2017    Medications: Reconciled today in Epic __________________________________________________________  Physical Exam:  Vital Signs: Vitals:   03/28/17 1115  BP: (!) 145/93  Pulse: 73  Temp: 97.9 F (36.6 C)  TempSrc: Oral  SpO2: 97%  Weight: (!) 380 lb 14.4 oz (172.8 kg)  Height: 6\' 3"  (1.905 m)    Gen: Well appearing, NAD CV: RRR, no murmurs Pulm: Normal effort, CTA throughout, no wheezing Ext: Warm, 1+ nonpitting edema bilaterally, no tenderness to palpation of deltoids and biceps Skin: No atypical appearing moles. No rashes

## 2017-03-28 NOTE — Assessment & Plan Note (Signed)
New complaint of myopathy in his upper shoulders and biceps.  Not much different in his thighs.  Could be consistent with his known cervical stenosis.  Also possible would be a statin induced myopathy.  Plan to check CK today, if it is normal I think is probably the cervical spine disease.

## 2017-03-28 NOTE — Assessment & Plan Note (Signed)
Patient with severe obstructive sleep apnea, last sleep study was in 2009.  I reviewed his compliance report with him which was excellent.  He has 100% compliance over 30-day.  And looks like he responds really well to his CPAP machine with current settings, pressure of 12 cm water, he did continue to fight air. We will plan to continue with CPAP at current settings.

## 2017-03-28 NOTE — Assessment & Plan Note (Signed)
Severe obesity continues to be his primary medical problem.  Weight is 380 pounds, up 3 pounds since I last saw him.  BMI 47.  This contributes to significant lower back pain, cervical pain, and makes it difficult for him to exercise.  Has been on disability for 16 years.  Encouraged him to increase exercise especially since it is getting warmer outside and likes to do yard work.  He is not very hopeful because he has been feeling increasing lower back pain which is been making him more sedentary lately.

## 2017-03-29 ENCOUNTER — Encounter: Payer: Self-pay | Admitting: Student in an Organized Health Care Education/Training Program

## 2017-03-29 LAB — BMP8+ANION GAP
ANION GAP: 17 mmol/L (ref 10.0–18.0)
BUN/Creatinine Ratio: 14 (ref 9–20)
BUN: 14 mg/dL (ref 6–24)
CALCIUM: 9 mg/dL (ref 8.7–10.2)
CO2: 22 mmol/L (ref 20–29)
CREATININE: 1.03 mg/dL (ref 0.76–1.27)
Chloride: 102 mmol/L (ref 96–106)
GFR calc Af Amer: 92 mL/min/{1.73_m2} (ref >59)
GFR calc non Af Amer: 80 mL/min/{1.73_m2} (ref >59)
Glucose: 93 mg/dL (ref 65–99)
Potassium: 4.1 mmol/L (ref 3.5–5.2)
SODIUM: 141 mmol/L (ref 134–144)

## 2017-04-12 DIAGNOSIS — K219 Gastro-esophageal reflux disease without esophagitis: Secondary | ICD-10-CM | POA: Diagnosis not present

## 2017-04-12 DIAGNOSIS — R062 Wheezing: Secondary | ICD-10-CM | POA: Diagnosis not present

## 2017-04-12 DIAGNOSIS — J310 Chronic rhinitis: Secondary | ICD-10-CM | POA: Diagnosis not present

## 2017-04-13 DIAGNOSIS — G4733 Obstructive sleep apnea (adult) (pediatric): Secondary | ICD-10-CM | POA: Diagnosis not present

## 2017-05-13 DIAGNOSIS — G4733 Obstructive sleep apnea (adult) (pediatric): Secondary | ICD-10-CM | POA: Diagnosis not present

## 2017-05-18 ENCOUNTER — Other Ambulatory Visit: Payer: Self-pay | Admitting: Student in an Organized Health Care Education/Training Program

## 2017-05-18 DIAGNOSIS — I10 Essential (primary) hypertension: Secondary | ICD-10-CM

## 2017-06-03 DIAGNOSIS — G4733 Obstructive sleep apnea (adult) (pediatric): Secondary | ICD-10-CM | POA: Diagnosis not present

## 2017-06-13 DIAGNOSIS — G4733 Obstructive sleep apnea (adult) (pediatric): Secondary | ICD-10-CM | POA: Diagnosis not present

## 2017-06-16 DIAGNOSIS — J449 Chronic obstructive pulmonary disease, unspecified: Secondary | ICD-10-CM | POA: Diagnosis not present

## 2017-06-16 DIAGNOSIS — J45909 Unspecified asthma, uncomplicated: Secondary | ICD-10-CM | POA: Diagnosis not present

## 2017-07-13 DIAGNOSIS — G4733 Obstructive sleep apnea (adult) (pediatric): Secondary | ICD-10-CM | POA: Diagnosis not present

## 2017-07-18 ENCOUNTER — Encounter: Payer: Medicare Other | Admitting: Student in an Organized Health Care Education/Training Program

## 2017-07-19 DIAGNOSIS — K219 Gastro-esophageal reflux disease without esophagitis: Secondary | ICD-10-CM | POA: Diagnosis not present

## 2017-07-19 DIAGNOSIS — J310 Chronic rhinitis: Secondary | ICD-10-CM | POA: Diagnosis not present

## 2017-07-19 DIAGNOSIS — J4520 Mild intermittent asthma, uncomplicated: Secondary | ICD-10-CM | POA: Diagnosis not present

## 2017-08-13 DIAGNOSIS — G4733 Obstructive sleep apnea (adult) (pediatric): Secondary | ICD-10-CM | POA: Diagnosis not present

## 2017-08-15 ENCOUNTER — Encounter: Payer: Self-pay | Admitting: Student in an Organized Health Care Education/Training Program

## 2017-08-15 ENCOUNTER — Other Ambulatory Visit: Payer: Self-pay

## 2017-08-15 ENCOUNTER — Ambulatory Visit (INDEPENDENT_AMBULATORY_CARE_PROVIDER_SITE_OTHER): Payer: Medicare Other | Admitting: Student in an Organized Health Care Education/Training Program

## 2017-08-15 VITALS — BP 146/76 | HR 59 | Temp 98.2°F | Wt 380.0 lb

## 2017-08-15 DIAGNOSIS — R519 Headache, unspecified: Secondary | ICD-10-CM

## 2017-08-15 DIAGNOSIS — J45909 Unspecified asthma, uncomplicated: Secondary | ICD-10-CM

## 2017-08-15 DIAGNOSIS — H04129 Dry eye syndrome of unspecified lacrimal gland: Secondary | ICD-10-CM | POA: Diagnosis not present

## 2017-08-15 DIAGNOSIS — R5383 Other fatigue: Secondary | ICD-10-CM | POA: Insufficient documentation

## 2017-08-15 DIAGNOSIS — M47816 Spondylosis without myelopathy or radiculopathy, lumbar region: Secondary | ICD-10-CM

## 2017-08-15 DIAGNOSIS — Z6841 Body Mass Index (BMI) 40.0 and over, adult: Secondary | ICD-10-CM

## 2017-08-15 DIAGNOSIS — K08409 Partial loss of teeth, unspecified cause, unspecified class: Secondary | ICD-10-CM

## 2017-08-15 DIAGNOSIS — M47812 Spondylosis without myelopathy or radiculopathy, cervical region: Secondary | ICD-10-CM

## 2017-08-15 DIAGNOSIS — I10 Essential (primary) hypertension: Secondary | ICD-10-CM

## 2017-08-15 DIAGNOSIS — Z9989 Dependence on other enabling machines and devices: Secondary | ICD-10-CM

## 2017-08-15 DIAGNOSIS — R51 Headache: Secondary | ICD-10-CM

## 2017-08-15 DIAGNOSIS — Z79899 Other long term (current) drug therapy: Secondary | ICD-10-CM

## 2017-08-15 DIAGNOSIS — H538 Other visual disturbances: Secondary | ICD-10-CM | POA: Diagnosis not present

## 2017-08-15 DIAGNOSIS — Z791 Long term (current) use of non-steroidal anti-inflammatories (NSAID): Secondary | ICD-10-CM

## 2017-08-15 DIAGNOSIS — G4733 Obstructive sleep apnea (adult) (pediatric): Secondary | ICD-10-CM

## 2017-08-15 NOTE — Assessment & Plan Note (Signed)
New complaint of mild fatigue.  Going to check a CBC today.  Has some risk for peptic ulcer disease given the large amount of NSAIDs he uses on a daily basis.  Otherwise exam is reassuring.

## 2017-08-15 NOTE — Assessment & Plan Note (Signed)
Restarted CPAP in December with a new machine and mask however having difficulty with the fit right now.  Many alert in the morning.  I advised he call the home health company to make sure that the tubing and the mask are working appropriately.

## 2017-08-15 NOTE — Assessment & Plan Note (Signed)
blood pressure elevated above goal in office.  He reports good ambulatory readings at home.   Plan to continue with carvedilol 12.5 twice daily,  Losartan 100 mg daily. Having some side effects like cough, may be some worsening asthma symptoms.  These are relatively mild side effects and I do not think a progress from using these medications further.  We will have to monitor closely.  Check BMP today.

## 2017-08-15 NOTE — Progress Notes (Signed)
   Assessment and Plan:  See Encounters tab for problem-based medical decision making.   __________________________________________________________  HPI:   57 year old man here for follow-up of hypertension.  Has a list of questions and complaints today which we covered.  Mostly he is doing well reports good compliance with his medications.  Having some increased fatigue, feels more tired when he mows the lawn recently.  Denies chest pain, does endorse a little bit of chest pressure but feels that his upper gas.  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  He reports taking 10-12 ibuprofen per day, says that he does not use those that he is going to lock up.  Has been doing this for about 10 years.  Reports that this keeps him functional.  Denies any blood in his stool.  He is complaining of some difficulty with sleeping, compliance with CPAP machine, reporting some increased dry cough.  Reports some dry eyes.  Endorses blurry vision.  Endorses a headache which feels like is all over his head almost on a daily basis.  Reports a occasional sensation of choking.  Reports of burn on the referral in his mouth after eating a homemade burrito.  Reports occasional neck tightness after take losartan, denies lip swelling or tongue swelling.`  __________________________________________________________  Problem List: Patient Active Problem List   Diagnosis Date Noted  . Morbid obesity ([place]) 11/22/2005    Priority: High  . Essential hypertension 11/22/2005    Priority: High    Class: Chronic  . Allergic rhinitis 12/23/2010    Priority: Medium  . Obstructive sleep apnea 04/03/2007    Priority: Medium  . Asthma 11/22/2005    Priority: Medium  . Encounter for preventive care 04/26/2016    Priority: Low  . Spinal stenosis in cervical region 11/19/2013    Priority: Low  . Spinal stenosis in lumbar region 10/17/2008    Priority:  Low  . GERD 06/23/2006    Priority: Low  . Fatigue 08/15/2017  . Headache 12/25/2012    Medications: Reconciled today in Epic __________________________________________________________  Physical Exam:  Vital Signs: Vitals:   08/15/17 1052 08/15/17 1122  BP: (!) 152/83 (!) 146/76  Pulse: 71 (!) 59  Temp: 98.2 F (36.8 C)   TempSrc: Oral   SpO2: 98%   Weight: (!) 380 lb (172.4 kg)     Gen: Well appearing, NAD ENT: OP clear without erythema or exudate. Missing front upper teeth. Small 32mm red lesions on the roof of his mouth that look consistent with healing mild burns.  CV: RRR, no murmurs, distant sounds Pulm: Normal effort, CTA throughout, no wheezing Ext: Warm, 1+ bilateral edema

## 2017-08-15 NOTE — Assessment & Plan Note (Signed)
A little bit worse recently,  pulmonary function testing [facility]. his allergist through do not have full report in care everywhere yet.   Seems like we will have to get back on to long-acting beta agonist or inhaled corticosteroid.  Has had a long history with difficulties with his medications in the past.  We will refer back to pulmonology where he has worked with Dr. Melvyn Novas before.

## 2017-08-15 NOTE — Assessment & Plan Note (Signed)
Almost daily headache for the last several months, previously I have thought this to be cervicogenic due to osteoarthritis of his cervical spine.  Will refer him to neurology, he has talked with Dr. Jannifer Franklin in the past and wants his input going forward.

## 2017-08-16 ENCOUNTER — Encounter: Payer: Self-pay | Admitting: Student in an Organized Health Care Education/Training Program

## 2017-08-16 LAB — CBC
HEMOGLOBIN: 15.9 g/dL (ref 13.0–17.7)
Hematocrit: 46.5 % (ref 37.5–51.0)
MCH: 30.1 pg (ref 26.6–33.0)
MCHC: 34.2 g/dL (ref 31.5–35.7)
MCV: 88 fL (ref 79–97)
Platelets: 213 10*3/uL (ref 150–450)
RBC: 5.28 x10E6/uL (ref 4.14–5.80)
RDW: 14.5 % (ref 12.3–15.4)
WBC: 7.7 10*3/uL (ref 3.4–10.8)

## 2017-08-16 LAB — BMP8+ANION GAP
ANION GAP: 16 mmol/L (ref 10.0–18.0)
BUN/Creatinine Ratio: 11 (ref 9–20)
BUN: 11 mg/dL (ref 6–24)
CALCIUM: 8.9 mg/dL (ref 8.7–10.2)
CO2: 24 mmol/L (ref 20–29)
CREATININE: 0.97 mg/dL (ref 0.76–1.27)
Chloride: 103 mmol/L (ref 96–106)
GFR calc Af Amer: 99 mL/min/{1.73_m2} (ref >59)
GFR, EST NON AFRICAN AMERICAN: 86 mL/min/{1.73_m2} (ref >59)
Glucose: 97 mg/dL (ref 65–99)
Potassium: 3.7 mmol/L (ref 3.5–5.2)
SODIUM: 143 mmol/L (ref 134–144)

## 2017-08-17 ENCOUNTER — Other Ambulatory Visit: Payer: Self-pay | Admitting: Student in an Organized Health Care Education/Training Program

## 2017-08-31 IMAGING — MR MR LUMBAR SPINE W/O CM
4 of 5 series · 25 of 48 positions shown · non-contrast
Comparison: Lumbar spine MRI 12/26/2010

CLINICAL DATA: Low back pain with radiation to the right leg.

EXAM:
MRI LUMBAR SPINE WITHOUT CONTRAST
TECHNIQUE: Multiplanar, multisequence MR imaging of the lumbar spine was
performed. No intravenous contrast was administered.

[Series 2: T1 · sagittal · 4.0mm · 0.51mm/px · 6 of 15 slices shown (1 of 2)]
[im 1/15]
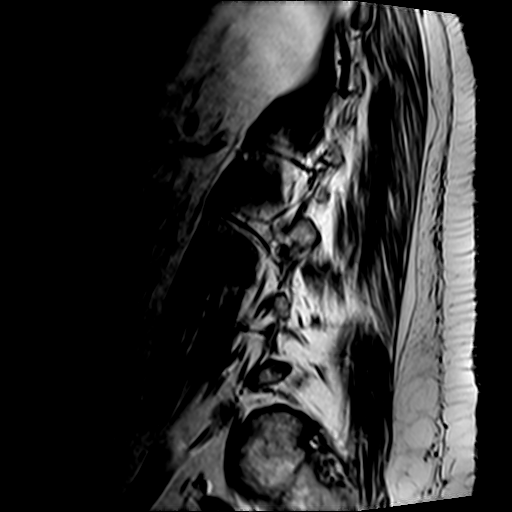
[im 3/15]
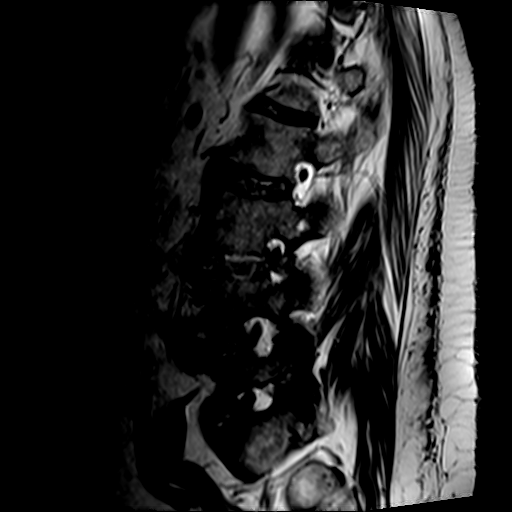
[im 6/15]
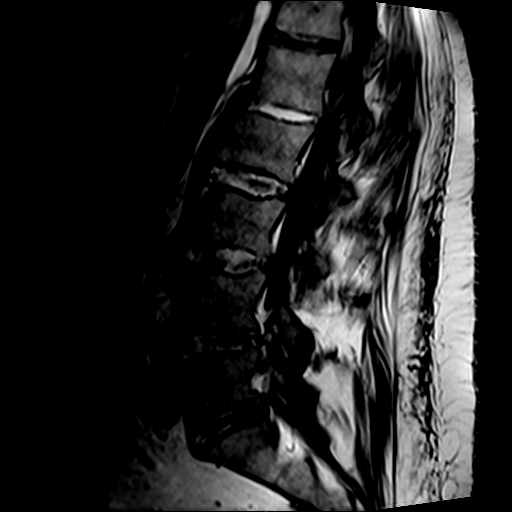
[im 9/15]
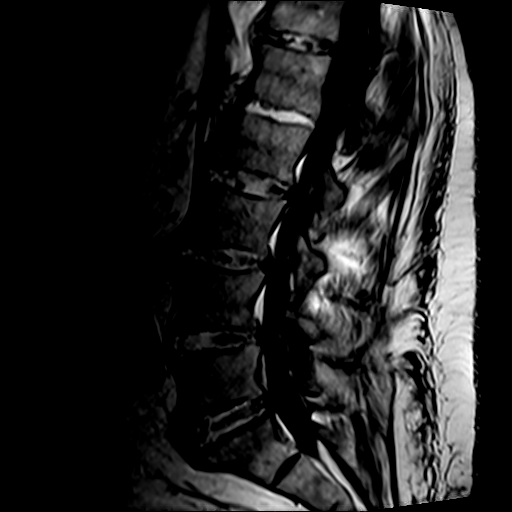
[im 12/15]
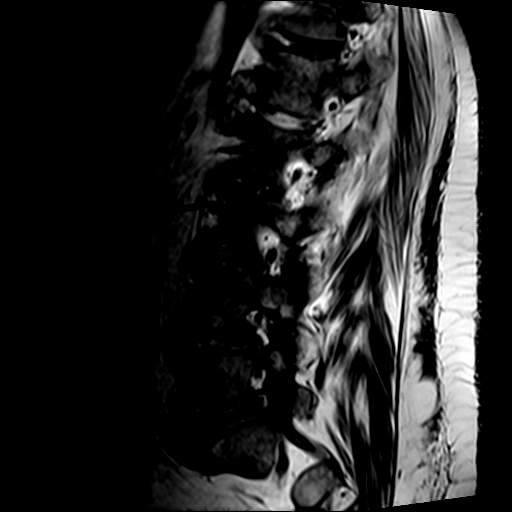
[im 15/15]
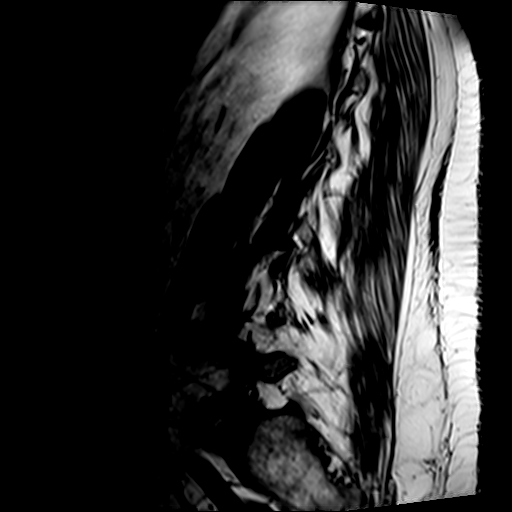

[Series 3: T2 · sagittal · 4.0mm · 0.81mm/px · 6 of 15 slices shown (1 of 2)]
[im 1/15]
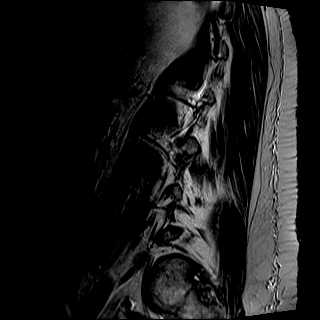
[im 3/15]
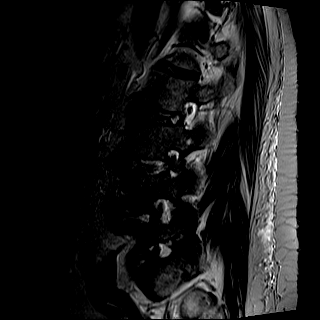
[im 6/15]
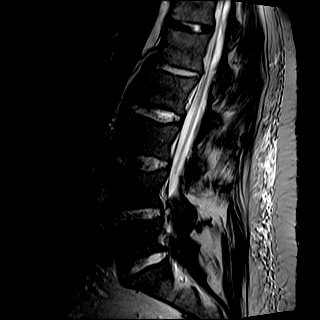
[im 9/15]
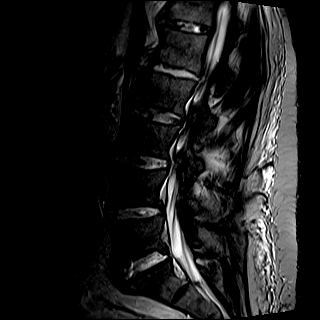
[im 12/15]
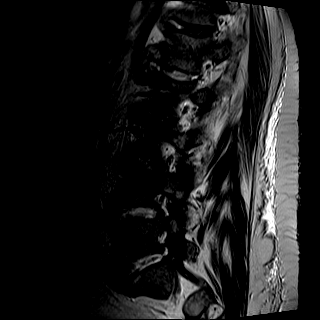
[im 15/15]
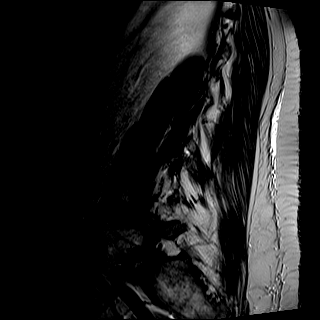

[Series 5: T2 · axial · 4.0mm · 0.39mm/px · z∈[-115,+85]mm · 9 of 35 slices shown (2 of 2)]
[im 1/35]
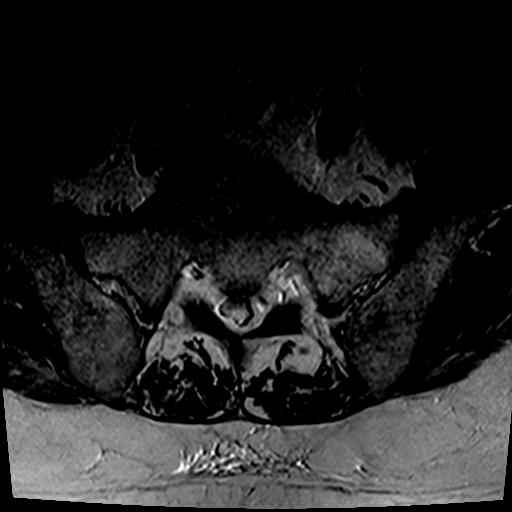
[im 5/35]
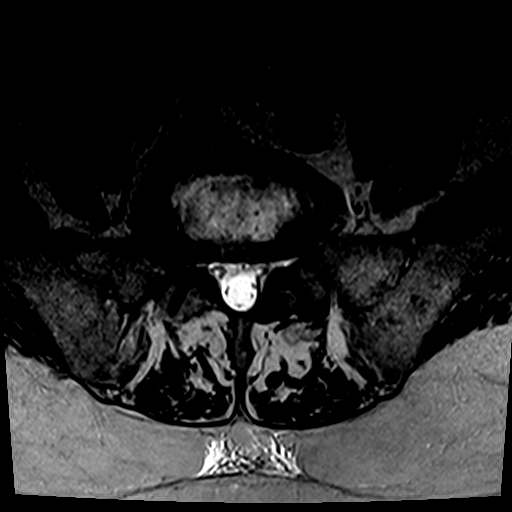
[im 10/35]
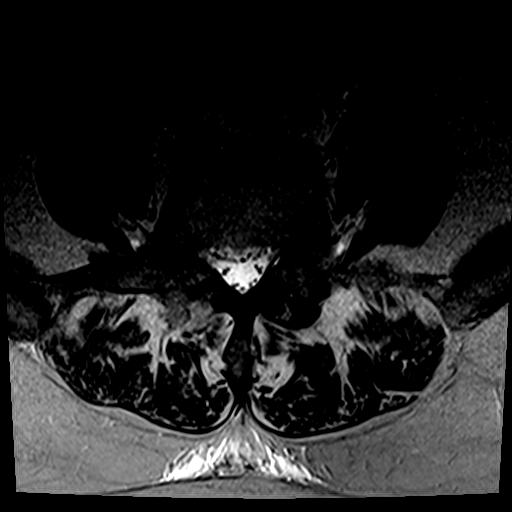
[im 15/35]
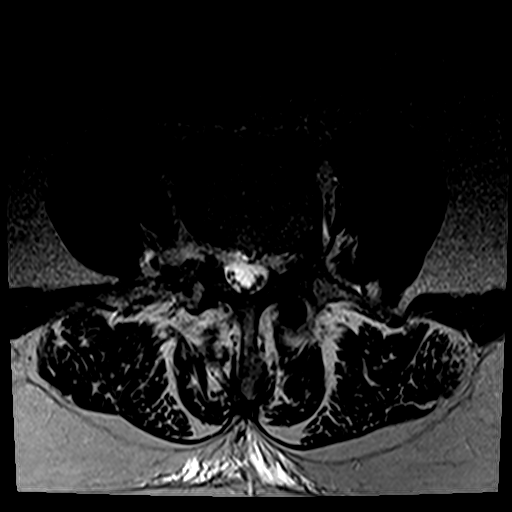
[im 18/35]
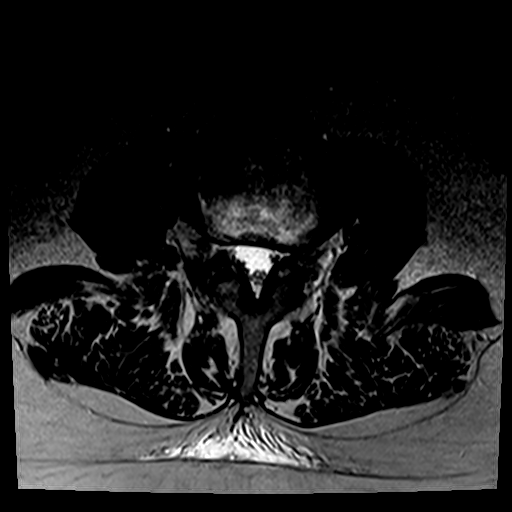
[im 20/35]
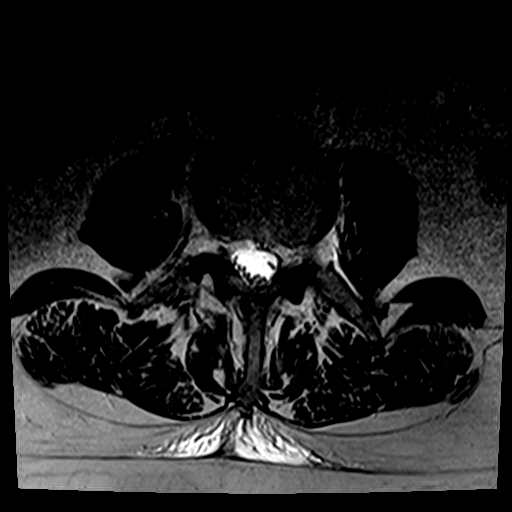
[im 25/35]
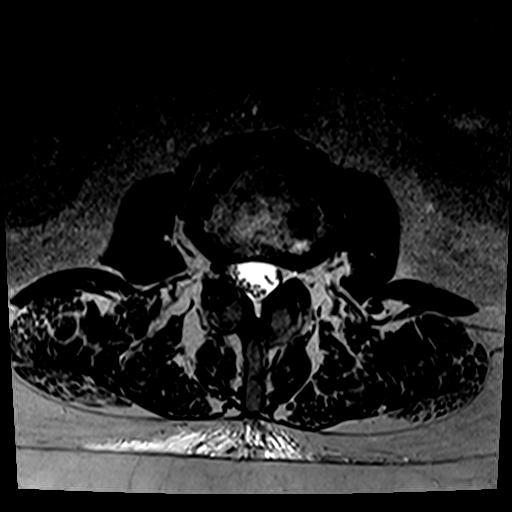
[im 30/35]
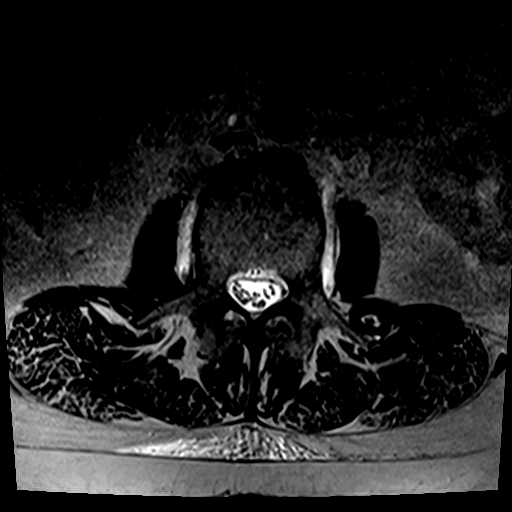
[im 35/35]
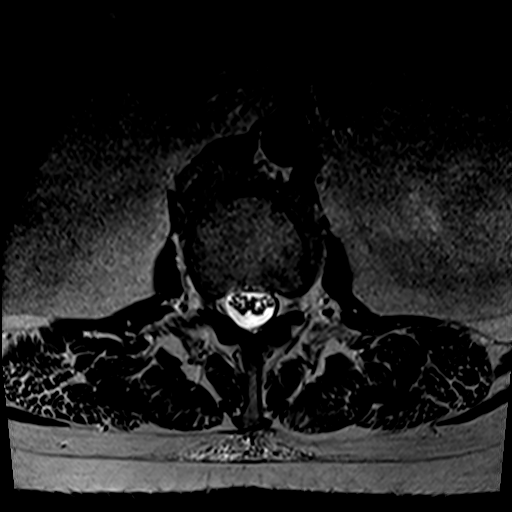

[Series 6: T1 · axial · 4.0mm · 0.78mm/px · z∈[-115,+60]mm · 4 of 35 slices shown (2 of 2)]
[im 1/35]
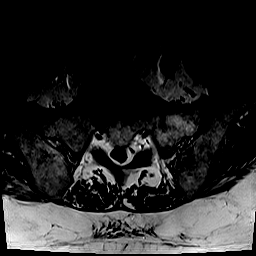
[im 5/35]
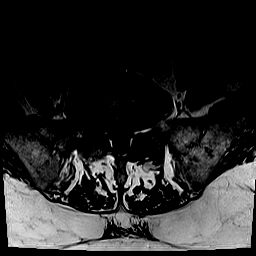
[im 18/35]
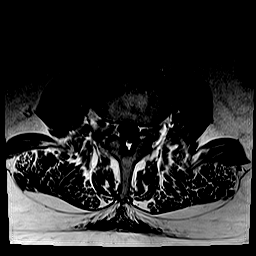
[im 30/35]
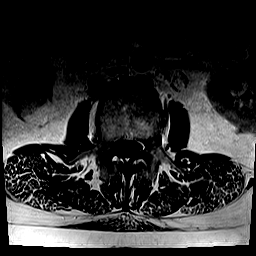

[25 of 48 positions shown; findings below may reference images not displayed]

FINDINGS: Segmentation: Normal. The lowest disc space is considered to be
L5-S1.

Alignment:  Normal

Vertebrae: No acute compression fracture, facet edema or focal
marrow lesion. There is greater T1 and T2 weighted signal within the
disc spaces compared to the prior examination.

Conus medullaris: Extends to the L1 level and appears normal.

Paraspinal and other soft tissues: The visualized aorta, IVC and
iliac vessels are normal. The visualized retroperitoneal organs and
paraspinal soft tissues are normal.

Disc levels:

T12-L1: Evaluated on sagittal images only. No significant disc
herniation, spinal canal stenosis or neuroforaminal narrowing.

L1-L2: Unchanged small central disc protrusion. Mild facet
hypertrophy. No spinal canal stenosis. No neuroforaminal stenosis.

L2-L3: Normal disc space and facet joints. No spinal canal stenosis.
No neuroforaminal stenosis.

L3-L4: Small disc bulge and bilateral facet hypertrophy, unchanged.
No spinal canal stenosis. Unchanged mild-to-moderate left
neuroforaminal stenosis.

L4-L5: Small central disc protrusion and bilateral facet
hypertrophy, unchanged. No spinal canal stenosis. Unchanged mild
bilateral neuroforaminal stenosis.

L5-S1: Small disc bulge and bilateral facet hypertrophy, unchanged.
No spinal canal stenosis. Unchanged mild-to-moderate right and mild
left neuroforaminal stenosis.
IMPRESSION: 1. Unchanged mild lumbar degenerative disc and facet disease without
spinal canal stenosis.
2. Unchanged mild-to-moderate foraminal narrowing at the L3-S1
levels.
3. Continued signal changes within the disc space likely indicates
further calcium deposition. This could be confirmed with
radiography.

## 2017-09-07 DIAGNOSIS — G4733 Obstructive sleep apnea (adult) (pediatric): Secondary | ICD-10-CM | POA: Diagnosis not present

## 2017-09-13 DIAGNOSIS — G4733 Obstructive sleep apnea (adult) (pediatric): Secondary | ICD-10-CM | POA: Diagnosis not present

## 2017-09-19 ENCOUNTER — Ambulatory Visit (INDEPENDENT_AMBULATORY_CARE_PROVIDER_SITE_OTHER): Payer: Medicare Other | Admitting: Internal Medicine

## 2017-09-19 ENCOUNTER — Encounter: Payer: Self-pay | Admitting: Internal Medicine

## 2017-09-19 ENCOUNTER — Other Ambulatory Visit: Payer: Self-pay

## 2017-09-19 VITALS — BP 119/56 | HR 72 | Temp 98.1°F | Ht 75.5 in | Wt 371.7 lb

## 2017-09-19 VITALS — BP 124/78 | HR 85 | Temp 98.1°F | Ht 75.5 in | Wt 370.0 lb

## 2017-09-19 DIAGNOSIS — G4733 Obstructive sleep apnea (adult) (pediatric): Secondary | ICD-10-CM | POA: Diagnosis not present

## 2017-09-19 DIAGNOSIS — J45991 Cough variant asthma: Secondary | ICD-10-CM | POA: Diagnosis not present

## 2017-09-19 DIAGNOSIS — N3000 Acute cystitis without hematuria: Secondary | ICD-10-CM | POA: Diagnosis not present

## 2017-09-19 DIAGNOSIS — J45909 Unspecified asthma, uncomplicated: Secondary | ICD-10-CM

## 2017-09-19 DIAGNOSIS — I10 Essential (primary) hypertension: Secondary | ICD-10-CM

## 2017-09-19 DIAGNOSIS — M48061 Spinal stenosis, lumbar region without neurogenic claudication: Secondary | ICD-10-CM

## 2017-09-19 DIAGNOSIS — E785 Hyperlipidemia, unspecified: Secondary | ICD-10-CM | POA: Diagnosis not present

## 2017-09-19 LAB — POCT URINALYSIS DIPSTICK
Bilirubin, UA: NEGATIVE
Glucose, UA: NEGATIVE
Nitrite, UA: POSITIVE
Protein, UA: POSITIVE — AB
Spec Grav, UA: >=1.03 — AB (ref 1.010–1.025)
Urobilinogen, UA: 1 E.U./dL
pH, UA: 5.5 (ref 5.0–8.0)

## 2017-09-19 LAB — NITRIC OXIDE: NITRIC OXIDE: 24

## 2017-09-19 MED ORDER — CEPHALEXIN 500 MG PO CAPS: 500.0000 mg | ORAL_CAPSULE | Freq: Two times a day (BID) | ORAL | 0 refills | Status: AC

## 2017-09-19 NOTE — Patient Instructions (Addendum)
We will  schedule sleep medicine consult next available   Please schedule a follow up office visit in 4 weeks, sooner if needed with pfts  Add check tsh, bnp/bmet / allergy profile next ov

## 2017-09-19 NOTE — Progress Notes (Signed)
Subjective:     Patient ID: Eduardo Duncan, male   DOB: 23-Feb-1960, 57 y.o.   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 Since that time he said he said symptoms everyday when eval here 03/15/07 with dx: prob acei cough      History of Present Illness  10/19/2010 f/u ov/Maebell Lyvers on ACE cc 6 weeks more short of breath, dry cough, increase need for albuterol. Also tol cpap poorly with waking up choking and also sometimes sense of choking on food.  No assoc cp or recent sign wt gain over baseline rec Stop coreg for now as it interferes with albuterol but once you are better with no need for albuterol it's ok to take it In meantime take samples of bystolic 5 mg twice daily stop lisinopril indefinitely > the symptoms you are having are classic for an ACE effect Start Benicar 20mg  one half daily  > if all better, pulmonary is as needed but you will need to get your doctor to prescribe this or a non-ACE alternative going forward.    09/19/2017  Pulmonary/ "new pt" ov / Jabriel Vanduyne re: sob ? Asthma worse x 07/2017 despite reported 30 lb wt loss  Chief Complaint  Patient presents with  . Pulmonary Consult    Referred by Dr Evette Doffing for eval of asthma. He c/o "mucus in my throat" and increased SOB for the past several months. He is using his proair inhaler 4 x per wk on average.   Dyspnea:  Does push mower for up 63min s stopping / does [employer] shopping / hc parking / slower pace than most shoppers Cough: varies daytime sensation of excess mucus in throat but min production   Sleeping: cpap  2 pillows on autoset trouble with mask staying on but denies excess drowsiness daytime  SABA use:  Up to sev times a day helps some  02: none     No obvious day to day or daytime variability or assoc excess/ purulent sputum or mucus plugs or  hemoptysis or cp or chest tightness, subjective wheeze or overt sinus or hb symptoms.   Sleeping as above  without nocturnal  or early am exacerbation  of respiratory  c/o's or need for noct saba. Also denies any obvious fluctuation of symptoms with weather or environmental changes or other aggravating or alleviating factors except as outlined above   No unusual exposure hx or h/o childhood pna/ asthma or knowledge of premature birth.  Current Allergies, Complete Past Medical History, Past Surgical History, Family History, and Social History were reviewed in [employer] record.  ROS  The following are not active complaints unless bolded Hoarseness, sore throat, dysphagia, dental problems, itching, sneezing,  nasal congestion or discharge of excess mucus or purulent secretions, ear ache,   fever, chills, sweats, unintended wt loss or wt gain, classically pleuritic or exertional cp,  orthopnea pnd or arm/hand swelling  or leg swelling, presyncope, palpitations, abdominal pain, anorexia, nausea, vomiting, diarrhea  or change in bowel habits or change in bladder habits, change in stools or change in urine, dysuria, hematuria,  rash, arthralgias, visual complaints, headache, numbness, weakness or ataxia or problems with walking or coordination,  change in mood or  memory.        Current Meds  Medication Sig  .  acetaminophen (TYLENOL) 500 MG tablet Take 500 mg by mouth every 6 (six) hours as needed.  Marland Kitchen albuterol (PROAIR HFA) 108 (90 Base) MCG/ACT inhaler Inhale 2 puffs every 6 (six) hours as needed into the lungs for wheezing or shortness of breath.  Marland Kitchen aspirin (ASPIRIN ADULT LOW STRENGTH) 81 MG EC tablet Take 81 mg by mouth daily.    . carvedilol (COREG) 12.5 MG tablet TAKE 1 TABLET BY MOUTH TWICE DAILY WITH A MEAL  . cetirizine (ZYRTEC) 10 MG tablet Take 10 mg by mouth daily.  . famotidine (PEPCID) 20 MG tablet TAKE 1/2 (ONE-HALF) TABLET BY MOUTH TWICE DAILY  . ibuprofen  (ADVIL,MOTRIN) 200 MG tablet Take 600 mg by mouth 3 (three) times daily as needed.   Marland Kitchen losartan (COZAAR) 100 MG tablet Take 1 tablet (100 mg total) daily by mouth.  . simvastatin (ZOCOR) 40 MG tablet Take 1 tablet (40 mg total) at bedtime by mouth.  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 Vertell Limber from [facility] brain and spine specialists:  -Recommended that he undergo lumbar spinal epidural injections and pain management treatment as he did not think the patient was a surgical candidate due to asthma and general comorbidities. He is to f/u on as needed basis and recommended seeing Dr. Letta Pate for pain managment and injection therapy (decision to hold off 2/2 psych reactions with steroids).  ??Polycythemia vera  Dizziness (chronic)  Prostatitis 2005, ? UTI in his 25s  ??gout (no synovial fluid analysis)  Cholecystitis >> cleared for sugery by Pulmonary July 28, 2009          Objective:   Physical Exam   Obese wm nad  wt 356 > 353 July 29, 2009 >  353 10/19/2010 > 09/19/2017   370  Vital signs reviewed - Note on arrival 02 sats  96% on RA  Modified Mallampati Score =   1   HEENT: nl dentition, turbinates bilaterally, and oropharynx. Nl external ear canals without cough reflex   NECK :  without JVD/Nodes/TM/ nl carotid upstrokes bilaterally   LUNGS: no acc muscle use,  Nl contour chest with distant bs  bilaterally without cough on insp or exp maneuvers   CV:  RRR  no s3 or murmur or increase in P2, and no edema   ABD:  Massively obese/ nontender with very limited  inspiratory excursion in the supine position. No bruits or organomegaly appreciated, bowel sounds nl  MS:  Nl gait/ ext warm  without deformities, calf tenderness, cyanosis or clubbing No obvious joint restrictions   SKIN: warm and dry without lesions    NEURO:  alert, approp, nl sensorium with  no motor or cerebellar deficits apparent.           Assessment:

## 2017-09-19 NOTE — Progress Notes (Signed)
   CC: dysuria  HPI:  Mr.Eduardo Duncan is a 57 y.o. with a PMH of OSA, spinal stenosis, HTN, HLD, Asthma presenting to clinic for evaluation of dysuria.  Patient endorses 1wk h/o of dysuria, urgency, frequency and 2d h/o fevers and chills. He denies hematuria, suprapubic pain, flank pain, nausea, vomiting, penile discharge or skin changes. He is not sexually active.   Please see problem based Assessment and Plan for status of patients chronic conditions.  Past Medical History:  Diagnosis Date  . Asthma   . Cervicogenic headache 12/25/2012  . Depression   . Hyperlipidemia   . Hypertension   . Obesity   . Obstructive sleep apnea    On CPAP  . 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 Spinal stenosis of lumbar region     Review of Systems:   Per HPI  Physical Exam:  Vitals:   09/19/17 1620  BP: (!) 119/56  Pulse: 72  Temp: 98.1 F (36.7 C)  TempSrc: Oral  SpO2: 97%  Weight: (!) 371 lb 11.2 oz (168.6 kg)  Height: 6' 3.5" (1.918 m)   GENERAL- alert, co-operative, appears as stated age, not in any distress. HEENT- oral mucosa appears moist. CARDIAC- RRR RESP- Moving equal volumes of air, and clear to auscultation bilaterally, no wheezes or crackles. ABDOMEN- Soft, nontender, bowel sounds present, no suprapubic tenderness. SKIN- Warm.  Assessment & Plan:   See Encounters Tab for problem based charting.   Patient discussed with Dr. Moshe Cipro, MD Internal Medicine PGY-3

## 2017-09-19 NOTE — Assessment & Plan Note (Signed)
Patient with s/sx of UTI with POC UA + for blood, nitrites and leukocytes consistent with complicated acute cystitis. He is HD stable and able to tolerate PO intake. No s/x of pyelonephritis or obstruction at this time.  Plan: --f/u complete UA --Urine culture --keflex 500mg  BID x7d; adjust as necessary based on culture results --return precautions discussed

## 2017-09-19 NOTE — Patient Instructions (Signed)
I've sent in keflex 500mg  twice daily for 7 days. I have sent the urine for culture and will call you if the results indicate we need to change the antibiotic. If your symptoms get worse or do not start improving in the next 2-3 days, please call us.

## 2017-09-20 LAB — SPECIMEN STATUS REPORT

## 2017-09-21 LAB — MICROSCOPIC EXAMINATION: WBC, UA: >30 /hpf — AB (ref 0–5)

## 2017-09-21 LAB — URINALYSIS, COMPLETE
Bilirubin, UA: NEGATIVE
Glucose, UA: NEGATIVE
Nitrite, UA: POSITIVE — AB
Specific Gravity, UA: 1.026 (ref 1.005–1.030)
Urobilinogen, Ur: 1 mg/dL (ref 0.2–1.0)
pH, UA: 5 (ref 5.0–7.5)

## 2017-09-21 NOTE — Progress Notes (Addendum)
Medicine attending: Medical history, presenting problems, physical findings, and medications, reviewed with resident physician Dr Alphonzo Grieve on the day of the patient visit and I concur with her evaluation and management plan.  Addendum: I signed this note in error. This resident was supervised by Dr Rebeca Alert.

## 2017-09-22 LAB — URINE CULTURE

## 2017-09-22 NOTE — Progress Notes (Signed)
Internal Medicine Clinic Attending  Case discussed with Dr. Svalina at the time of the visit.  We reviewed the resident's history and exam and pertinent patient test results.  I agree with the assessment, diagnosis, and plan of care documented in the resident's note.  Alexander Raines, M.D., Ph.D.  

## 2017-09-26 ENCOUNTER — Encounter: Payer: Self-pay | Admitting: Internal Medicine

## 2017-09-26 NOTE — Assessment & Plan Note (Signed)
Already on cpap autoset but has issues with mask/ effectiveness > set up sleep medicine consult at his request.  Advised impact of wt on illness

## 2017-09-26 NOTE — Assessment & Plan Note (Addendum)
Body mass index is 45.64 kg/m.  -  trending says trending down but note was about 20 lb lighter at last pulmonary ov in 2012 and now with osa /hbp/ hyperlipidemia Lab Results  Component Value Date   TSH 0.811 05/27/2015     Contributing to gerd risk/ doe/reviewed the need and the process to achieve and maintain neg calorie balance > defer f/u primary care including intermittently monitoring thyroid status    Total time devoted to counseling  > 50 % of initial 60 min office visit:  review case with pt/ discussion of options/alternatives/ personally creating written customized instructions  in presence of pt  then going over those specific  Instructions directly with the pt including how to use all of the meds but in particular covering each new medication in detail and the difference between the maintenance= "automatic" meds and the prns using an action plan format for the latter (If this problem/symptom => do that organization reading Left to right).  Please see AVS from this visit for a full list of these instructions which I personally wrote for this pt and  are unique to this visit.  See device teaching which extended face to face time for this visit

## 2017-09-26 NOTE — Assessment & Plan Note (Addendum)
Spirometry 09/19/2017  FEV1 2.6  (58%)  Ratio 70 min curvature off rx  - FENO 09/19/2017  =   24     DDX of  difficult airways management almost all start with A and  include Adherence, Ace Inhibitors, Acid Reflux, Active Sinus Disease, Alpha 1 Antitripsin deficiency, Anxiety masquerading as Airways dz,  ABPA,  Allergy(esp in young), Aspiration (esp in elderly), Adverse effects of meds,  Active smokers, A bunch of PE's (a small clot burden can't cause this syndrome unless there is already severe underlying pulm or vascular dz with poor reserve) plus two Bs  = Bronchiectasis and Beta blocker use..and one C= CHF  Adherence is always the initial "prime suspect" and is a multilayered concern that requires a "trust but verify" approach in every patient - starting with knowing how to use medications, especially inhalers, correctly, keeping up with refills and understanding the fundamental difference between maintenance and prns vs those medications only taken for a very short course and then stopped and not refilled.  - see hfa teaching - advised to return with all meds in hand using a trust but verify approach to confirm accurate Medication  Reconciliation The principal here is that until we are certain that the  patients are doing what we've asked, it makes no sense to ask them to do more.   ? Acid (or non-acid) GERD > always difficult to exclude as up to 75% of pts in some series report no assoc GI/ Heartburn symptoms> rec consider max (24h)  acid suppression and diet restrictions  ? Allergy > consider testing next ov   ? Adverse effects of cozar: For reasons that may related to vascular permability and nitric oxide pathways but not elevated  bradykinin levels (as seen with  ACEi use) losartan in the generic form has been reported now from mulitple sources  to cause a similar pattern of non-specific  upper airway symptoms as seen with acei.   This has not been reported with exposure to the other ARB's to  date, so may need to consider:  try either generic diovan or avapro if ARB needed or use an alternative class altogether.  See:  Lelon Frohlich Allergy Asthma Immunol  2008: 101: p [phone]      ? BB effects : unlikely on low dose coreg  ? CHF / cardiac asthma > echo ok 05/26/16 may need to repeat bnp next ov    Really nothing to suggest asthma here but needs to return for allergy profile and  full pfts with before and after pfts and if nl consider MCT while on gerd rx to reduce possibility of false positive testing.    - The proper method of use, as well as anticipated side effects, of a metered-dose inhaler are discussed and demonstrated to the patient using teach back method

## 2017-10-12 ENCOUNTER — Ambulatory Visit (INDEPENDENT_AMBULATORY_CARE_PROVIDER_SITE_OTHER): Payer: Medicare Other | Admitting: Neurology

## 2017-10-12 ENCOUNTER — Encounter: Payer: Self-pay | Admitting: Neurology

## 2017-10-12 VITALS — BP 138/78 | HR 60 | Ht 75.5 in | Wt 366.0 lb

## 2017-10-12 DIAGNOSIS — M5441 Lumbago with sciatica, right side: Secondary | ICD-10-CM | POA: Diagnosis not present

## 2017-10-12 DIAGNOSIS — R519 Headache, unspecified: Secondary | ICD-10-CM

## 2017-10-12 DIAGNOSIS — G8929 Other chronic pain: Secondary | ICD-10-CM

## 2017-10-12 DIAGNOSIS — R51 Headache: Secondary | ICD-10-CM

## 2017-10-12 MED ORDER — BACLOFEN 10 MG PO TABS: 5.0000 mg | ORAL_TABLET | Freq: Two times a day (BID) | ORAL | 2 refills | Status: DC

## 2017-10-12 NOTE — Progress Notes (Signed)
Reason for visit: Headache  Referring physician: Dr. Alyse Low is a 57 y.o. male  History of present illness:  Mr. Drone is a 57 year old right-handed white male with a history of chronic neck and low back pain.  The patient has a greater than 10 or 15-year history of chronic neck pain and headaches associated with this.  The headaches come up in the back of the head but may also be in the front and top of the head.  The headaches are daily in nature.  The patient takes 10-12 ibuprofen tablets daily.  If he does not take the medication his back and head pain worsens.  The patient has also had some increase in low back pain and he has some troubles with a sensation of weakness in the right leg, he has some burning sensations across the lower abdomen as well.  This issue with the back and legs has worsened over the last several weeks.  He has had MRI evaluation of the lumbar spine last done in November 2017.  The patient is not sleeping well, he is on CPAP.  He reports no recent falls.  He denies incontinence of the bowels or the bladder.  He comes to the office today for an evaluation of the above symptoms.  Past Medical History:  Diagnosis Date  . Asthma   . Cervicogenic headache 12/25/2012  . Depression   . Hyperlipidemia   . Hypertension   . Obesity   . Obstructive sleep apnea    On CPAP  . 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 Spinal stenosis of lumbar region     Past Surgical History:  Procedure Laterality Date  . CHOLECYSTECTOMY  07/2009   Lap  . CHOLECYSTECTOMY  07/11/2009  . COLONOSCOPY  01/2009    Family History  Problem Relation Age of Onset  . Diabetes Mother   . Hypertension Mother   . Alzheimer's disease Mother   . Heart disease Mother   . Heart disease Father   . Colon cancer Brother 77  . Stroke Brother   . Cancer Maternal Grandmother   . Ovarian cancer Paternal Grandmother   . Coronary artery disease Brother 48    Social history:  reports  that he quit smoking about 31 years ago. His smoking use included cigarettes. He has a 12.00 pack-year smoking history. He has never used smokeless tobacco. He reports that he does not drink alcohol or use drugs.  Medications:  Prior to Admission medications   Medication Sig Start Date End Date Taking? Authorizing Provider  acetaminophen (TYLENOL) 500 MG tablet Take 500 mg by mouth every 6 (six) hours as needed.   Yes [provider]  albuterol (PROAIR HFA) 108 (90 Base) MCG/ACT inhaler Inhale 2 puffs every 6 (six) hours as needed into the lungs for wheezing or shortness of breath. 11/22/16  Yes Axel Filler, MD  aspirin (ASPIRIN ADULT LOW STRENGTH) 81 MG EC tablet Take 81 mg by mouth daily.     Yes [provider]  carvedilol (COREG) 12.5 MG tablet TAKE 1 TABLET BY MOUTH TWICE DAILY WITH A MEAL 05/18/17  Yes Annia Belt, MD  cetirizine (ZYRTEC) 10 MG tablet Take 10 mg by mouth daily.   Yes [provider]  famotidine (PEPCID) 20 MG tablet TAKE 1/2 (ONE-HALF) TABLET BY MOUTH TWICE DAILY 08/17/17  Yes Axel Filler, MD  ibuprofen (ADVIL,MOTRIN) 200 MG tablet Take 600 mg by mouth 3 (three) times daily  as needed.    Yes [provider]  losartan (COZAAR) 100 MG tablet Take 1 tablet (100 mg total) daily by mouth. 11/22/16  Yes Axel Filler, MD  simvastatin (ZOCOR) 40 MG tablet Take 1 tablet (40 mg total) at bedtime by mouth. 11/22/16  Yes Axel Filler, MD  UNABLE TO FIND Med Name: CPAP   Yes [provider]      Allergies  Allergen Reactions  . Ace Inhibitors Cough  . Advair Diskus [Fluticasone-Salmeterol] Other (See Comments)    Confusion and paranoid  . Codeine Nausea Only    REACTION: nausea  . Gabapentin Other (See Comments)    Facial and left shoulder numbness  . Montelukast Sodium Other (See Comments)    Confusion and paranoid  . Prednisone Other (See Comments)    Confusion and paranoid  .  Prednisolone Anxiety    ROS:  Out of a complete 14 system review of symptoms, the patient complains only of the following symptoms, and all other reviewed systems are negative.  Fatigue Ringing in the ears, difficulty swallowing Blurred vision Shortness of breath Joint pain, muscle cramps, aching muscles Allergies, runny nose Headache, numbness, weakness, dizziness, tremor Anxiety, none of sleep, decreased energy  Blood pressure 138/78, pulse 60, height 6' 3.5" (1.918 m), weight (!) 366 lb (166 kg), SpO2 98 %.  Physical Exam  General: The patient is alert and cooperative at the time of the examination.  The patient is markedly obese.  Eyes: Pupils are equal, round, and reactive to light. Discs are flat bilaterally.  Neck: The neck is supple, no carotid bruits are noted.  Respiratory: The respiratory examination is clear.  Cardiovascular: The cardiovascular examination reveals a regular rate and rhythm, no obvious murmurs or rubs are noted.  Neuromuscular: The patient has severe limitation of flexion extension and rotational movement of the cervical spine.  Skin: Extremities are without significant edema.  Neurologic Exam  Mental status: The patient is alert and oriented x 3 at the time of the examination. The patient has apparent normal recent and remote memory, with an apparently normal attention span and concentration ability.  Cranial nerves: Facial symmetry is present. There is good sensation of the face to pinprick and soft touch bilaterally. The strength of the facial muscles and the muscles to head turning and shoulder shrug are normal bilaterally. Speech is well enunciated, no aphasia or dysarthria is noted. Extraocular movements are full. Visual fields are full. The tongue is midline, and the patient has symmetric elevation of the soft palate. No obvious hearing deficits are noted.  Motor: The motor testing reveals 5 over 5 strength of all 4 extremities. Good  symmetric motor tone is noted throughout.  Sensory: Sensory testing is intact to pinprick, soft touch and vibration sensation on all 4 extremities. No evidence of extinction is noted.  Coordination: Cerebellar testing reveals good finger-nose-finger and heel-to-shin bilaterally.  Gait and station: Gait is normal. Tandem gait is normal. Romberg is negative. No drift is seen.  Reflexes: Deep tendon reflexes are symmetric and normal bilaterally. Toes are downgoing bilaterally.   Assessment/Plan:  1.  Chronic neck and low back pain  2.  Cervicogenic headache  The patient is having some issues with increased back pain and bilateral leg discomfort and some weakness of the right leg.  We will set the patient up for nerve conduction studies on both legs, EMG on the right leg.  The patient will be placed on low-dose baclofen for the neck and  shoulder discomfort and for headache.  In the past he has not tolerated muscle relaxant medications well.  The patient will follow-up for the EMG study.  Jill Alexanders MD 10/12/2017 2:28 PM  [facility] [address] [place], [place] [address]  Phone [phone] Fax [phone]

## 2017-10-14 ENCOUNTER — Telehealth: Payer: Self-pay | Admitting: Student in an Organized Health Care Education/Training Program

## 2017-10-14 NOTE — Telephone Encounter (Signed)
Pt calls and states since he started abx 3 weeks ago he has diarrhea and abd bloating, "grumbling", he is now eating white rice, crackers, plain potatoes, plain toast. Now it goes from very soft stools to diarrhea and has a very "bile type smell". States no fevers, vomiting, weakness or severe abd pain. appt offered for mon, refused due to transportation, will not be able to come in before Thursday, offered 1000 Thursday am and refuses because he does not get up til 1230, appt [facility] thurs 10/10 at 1445.

## 2017-10-14 NOTE — Telephone Encounter (Signed)
Pt finished taking antibiotics but his bowel is giving him trouble, pls contact [phone]

## 2017-10-17 NOTE — Telephone Encounter (Signed)
I would keep taking pepcid for now. He is very sensitive to medication changes, and I would want to go over any potential changes carefully in a visit. He can come see me if his reflux symptoms are worsening and we can talk about the options.

## 2017-10-17 NOTE — Telephone Encounter (Signed)
Thanks. This sounds consistent with antibiotic-associated diarrhea. Should expect it to resolve days-weeks after finishing course. Come to clinic if he has abdominal pain or fevers.

## 2017-10-17 NOTE — Telephone Encounter (Signed)
Spoke w/ pt, states he actually feels better, cancelled thurs appt He also asks if dr Evette Doffing wanted to change his pepcid to something else, stated he talked to dr Evette Doffing about it at last appt, states his "stomach feels like it is boiling lots of times" and he thinks maybe the pepcid is not working. Please advise

## 2017-10-17 NOTE — Telephone Encounter (Signed)
Pt rtc, gave him dr vincent's message, offered appt 10/28, states he will wait because he will be too busy with other dr's appts for awhile, ask him to call back if discomfort becomes worse, he is agreeable

## 2017-10-17 NOTE — Telephone Encounter (Signed)
rtc to pt, lm for rtc 

## 2017-10-20 ENCOUNTER — Ambulatory Visit: Payer: Medicare Other

## 2017-10-31 ENCOUNTER — Ambulatory Visit (INDEPENDENT_AMBULATORY_CARE_PROVIDER_SITE_OTHER): Payer: Medicare Other | Admitting: Internal Medicine

## 2017-10-31 ENCOUNTER — Encounter: Payer: Self-pay | Admitting: Internal Medicine

## 2017-10-31 DIAGNOSIS — J45991 Cough variant asthma: Secondary | ICD-10-CM | POA: Diagnosis not present

## 2017-10-31 DIAGNOSIS — I10 Essential (primary) hypertension: Secondary | ICD-10-CM | POA: Diagnosis not present

## 2017-10-31 DIAGNOSIS — G4733 Obstructive sleep apnea (adult) (pediatric): Secondary | ICD-10-CM | POA: Diagnosis not present

## 2017-10-31 LAB — PULMONARY FUNCTION TEST
DL/VA % pred: 103 %
DL/VA: 5.07 ml/min/mmHg/L
DLCO unc % pred: 89 %
DLCO unc: 35.15 ml/min/mmHg
FEF 25-75 Post: 2.72 L/sec
FEF 25-75 Pre: 1.96 L/sec
FEF2575-%CHANGE-POST: 39 %
FEF2575-%Pred-Post: 76 %
FEF2575-%Pred-Pre: 54 %
FEV1-%CHANGE-POST: 9 %
FEV1-%PRED-POST: 70 %
FEV1-%Pred-Pre: 64 %
FEV1-Post: 3.05 L
FEV1-Pre: 2.79 L
FEV1FVC-%CHANGE-POST: 4 %
FEV1FVC-%Pred-Pre: 93 %
FEV6-%Change-Post: 4 %
FEV6-%PRED-POST: 75 %
FEV6-%Pred-Pre: 71 %
FEV6-PRE: 3.92 L
FEV6-Post: 4.11 L
FEV6FVC-%PRED-PRE: 104 %
FEV6FVC-%Pred-Post: 104 %
FVC-%CHANGE-POST: 4 %
FVC-%PRED-PRE: 68 %
FVC-%Pred-Post: 72 %
FVC-POST: 4.11 L
FVC-Pre: 3.92 L
POST FEV1/FVC RATIO: 74 %
PRE FEV6/FVC RATIO: 100 %
Post FEV6/FVC ratio: 100 %
Pre FEV1/FVC ratio: 71 %
RV % PRED: 145 %
RV: 3.59 L
TLC % pred: 97 %
TLC: 7.83 L

## 2017-10-31 MED ORDER — VALSARTAN 160 MG PO TABS: 160.0000 mg | ORAL_TABLET | Freq: Every day | ORAL | 11 refills | Status: DC

## 2017-10-31 NOTE — Progress Notes (Signed)
PFT completed today.  

## 2017-10-31 NOTE — Patient Instructions (Addendum)
Sleep medicine consultation first available   Stop losartan and try diovan 160 mg one daily    For worse breathing > see me or your pulmonary sleep medicine once established

## 2017-10-31 NOTE — Progress Notes (Signed)
Subjective:     Patient ID: Eduardo Duncan, male   DOB: [birth year], 57 y.o.   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 Since that time he said he said symptoms everyday when eval here 03/15/07 with dx: prob acei cough and pfts 10/31/2017 c/w obesity    Asthma/ allergy on [place] 12/14/16 neg RAST in care everywhere       History of Present Illness  10/19/2010 f/u ov/Wert on ACE cc 6 weeks more short of breath, dry cough, increase need for albuterol. Also tol cpap poorly with waking up choking and also sometimes sense of choking on food.  No assoc cp or recent sign wt gain over baseline rec Stop coreg for now as it interferes with albuterol but once you are better with no need for albuterol it's ok to take it In meantime take samples of bystolic 5 mg twice daily stop lisinopril indefinitely > the symptoms you are having are classic for an ACE effect Start Benicar 20mg  one half daily  > if all better, pulmonary is as needed but you will need to get your doctor to prescribe this or a non-ACE alternative going forward.    09/19/2017  Pulmonary/ "new pt" ov / Wert re: sob ? Asthma worse x 07/2017 despite reported 30 lb wt loss  Chief Complaint  Patient presents with  . Pulmonary Consult    Referred by Dr Evette Doffing for eval of asthma. He c/o "mucus in my throat" and increased SOB for the past several months. He is using his proair inhaler 4 x per wk on average.   Dyspnea:  Does push mower for up 49min s stopping / does [employer] shopping / hc parking / slower pace than most shoppers Cough: varies daytime sensation of excess mucus in throat but min production   Sleeping: cpap  2 pillows on autoset trouble with mask staying on but denies excess drowsiness daytime  SABA use:  Up to sev times a day helps some  02: none   rec We  will  schedule sleep medicine consult next available  Please schedule a follow up office visit in 4 weeks, sooner if needed with pfts        10/31/2017  f/u ov/Wert re:  ? Asthma vs uacs ? Losartan related  Chief Complaint  Patient presents with  . Follow-up    cough   Dyspnea:  MMRC1 = can walk nl pace, flat grade, can't hurry or go uphills or steps s sob   Cough: mucus in throat worse first hour or two in am/ better p albuterol/ ? Better p zyrtec  Sleeping: cpap  2 pillows SABA use: 3 x weekly  02: none     No obvious day to day or daytime variability or assoc excess/ purulent sputum or mucus plugs or hemoptysis or cp or chest tightness, subjective wheeze or overt sinus or hb symptoms.   Sleeps on cpap as above  without nocturnal   exacerbation  of respiratory  c/o's or need for noct saba. Also denies any obvious fluctuation of symptoms with weather or environmental changes or other aggravating or alleviating factors except as outlined above   No unusual exposure hx or h/o childhood pna/ asthma or knowledge of premature birth.  Current Allergies, Complete Past Medical History, Past Surgical  History, Family History, and Social History were reviewed in [employer] record.  ROS  The following are not active complaints unless bolded Hoarseness, sore throat, dysphagia, dental problems, itching, sneezing,  nasal congestion or discharge of excess mucus or purulent secretions, ear ache,   fever, chills, sweats, unintended wt loss or wt gain, classically pleuritic or exertional cp,  orthopnea pnd or arm/hand swelling  or leg swelling, presyncope, palpitations, abdominal pain, anorexia, nausea, vomiting, diarrhea  or change in bowel habits or change in bladder habits, change in stools or change in urine, dysuria, hematuria,  rash, arthralgias, visual complaints, headache, numbness, weakness or ataxia or problems with walking or coordination,  change in mood or  memory.         Current Meds  Medication Sig  . acetaminophen (TYLENOL) 500 MG tablet Take 500 mg by mouth every 6 (six) hours as needed.  Marland Kitchen albuterol (PROAIR HFA) 108 (90 Base) MCG/ACT inhaler Inhale 2 puffs every 6 (six) hours as needed into the lungs for wheezing or shortness of breath.  Marland Kitchen aspirin (ASPIRIN ADULT LOW STRENGTH) 81 MG EC tablet Take 81 mg by mouth daily.    . carvedilol (COREG) 12.5 MG tablet TAKE 1 TABLET BY MOUTH TWICE DAILY WITH A MEAL  . cetirizine (ZYRTEC) 10 MG tablet Take 10 mg by mouth daily.  . famotidine (PEPCID) 20 MG tablet TAKE 1/2 (ONE-HALF) TABLET BY MOUTH TWICE DAILY  . ibuprofen (ADVIL,MOTRIN) 200 MG tablet Take 600 mg by mouth 3 (three) times daily as needed.   . simvastatin (ZOCOR) 40 MG tablet Take 1 tablet (40 mg total) at bedtime by mouth.  Marland Kitchen UNABLE TO FIND Med Name: CPAP  .   losartan (COZAAR) 100 MG tablet Take 1 tablet (100 mg total) daily by mouth.            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 Vertell Limber from [facility] brain and spine specialists:  -Recommended that he undergo lumbar spinal epidural injections and pain management treatment as he did not think the patient was a surgical candidate due to asthma and general comorbidities. He is to f/u on as needed basis and recommended seeing Dr. Letta Pate for pain managment and injection therapy (decision to hold off 2/2 psych reactions with steroids).  ??  Polycythemia vera  Dizziness (chronic)  Prostatitis 2005, ? UTI in his 86s  ??gout (no synovial fluid analysis)  Cholecystitis >> cleared for sugery by Pulmonary July 28, 2009          Objective:   Physical Exam   amb obese wm nad   wt 356 > 353 July 29, 2009 >  353 10/19/2010 > 09/19/2017   370 > 10/31/2017   369    Vital signs reviewed - Note on arrival 02 sats  94% on RA  And bp 124/76   HEENT: nl dentition, turbinates bilaterally, and oropharynx. Nl external ear canals without cough reflex  Modified Mallampati Score =   1  NECK :  without JVD/Nodes/TM/ nl carotid upstrokes bilaterally   LUNGS: no acc muscle use,  Nl contour chest which is clear to A and P bilaterally without cough on insp or exp maneuvers   CV:  RRR  no s3 or murmur  or increase in P2, and no edema   ABD:  Quite obese soft and nontender with nl inspiratory excursion in the supine position. No bruits or organomegaly appreciated, bowel sounds nl  MS:  Nl gait/ ext warm without deformities, calf tenderness, cyanosis or clubbing No obvious joint restrictions   SKIN: warm and dry without lesions    NEURO:  alert, approp, nl sensorium with  no motor or cerebellar deficits apparent.             Assessment:

## 2017-11-01 ENCOUNTER — Encounter: Payer: Self-pay | Admitting: Internal Medicine

## 2017-11-01 NOTE — Assessment & Plan Note (Signed)
Referred to sleep medicine for longterm f/u

## 2017-11-01 NOTE — Assessment & Plan Note (Addendum)
Complicated by low erv by pfts 10/31/2017    Body mass index is 46.12 kg/m.  -  trending slt up  Lab Results  Component Value Date   TSH 0.811 05/27/2015     Contributing to gerd risk/ doe/reviewed the need and the process to achieve and maintain neg calorie balance > defer f/u primary care including intermittently monitoring thyroid status

## 2017-11-01 NOTE — Assessment & Plan Note (Signed)
For reasons that may related to vascular permability and nitric oxide pathways but not elevated  bradykinin levels (as seen with  ACEi use) losartan in the generic form has been reported now from mulitple sources  to cause a similar pattern of non-specific  upper airway symptoms as seen with acei.   This has not been reported with exposure to the other ARB's to date, so it seems reasonable for now to try either generic diovan or avapro if ARB needed or use an alternative class altogether.  See:  Lelon Frohlich Allergy Asthma Immunol  2008: 101: p [phone]     Try diovan 160 mg > Follow up per Primary Care planned

## 2017-11-01 NOTE — Assessment & Plan Note (Addendum)
12/14/16 neg RAST in care everywhere  Spirometry 09/19/2017  FEV1 2.6  (58%)  Ratio 70 min curvature off rx  - FENO 09/19/2017  =   24   - PFT's  10/31/2017  FEV1 3.05 (70 % ) ratio 74  p 9 % improvement from saba p nothing prior to study with DLCO  89 % corrects to 103  % for alv volume  - try off losartan 10/31/2017 (see hbp)    No evidence to support asthma or allergy here > f/u pulmonary prn once establishes with pulmonary /sleep medicine

## 2017-11-09 ENCOUNTER — Encounter: Payer: Self-pay | Admitting: Neurology

## 2017-11-09 ENCOUNTER — Ambulatory Visit (INDEPENDENT_AMBULATORY_CARE_PROVIDER_SITE_OTHER): Payer: Medicare Other | Admitting: Neurology

## 2017-11-09 DIAGNOSIS — G8929 Other chronic pain: Secondary | ICD-10-CM

## 2017-11-09 DIAGNOSIS — M5441 Lumbago with sciatica, right side: Secondary | ICD-10-CM

## 2017-11-09 DIAGNOSIS — M48061 Spinal stenosis, lumbar region without neurogenic claudication: Secondary | ICD-10-CM

## 2017-11-09 NOTE — Procedures (Signed)
     HISTORY:  Eduardo Duncan is a 57 year old gentleman with a long-standing history of chronic low back pain dating over 10 years or more.  The patient has some discomfort down the right leg, pain in the right calf muscle.  He is being evaluated for a possible neuropathy or a lumbar radiculopathy.  NERVE CONDUCTION STUDIES:  Nerve conduction studies were performed on both lower extremities. The distal motor latencies and motor amplitudes for the peroneal and posterior tibial nerves were within normal limits. The nerve conduction velocities for these nerves were also normal. The sensory latencies for the peroneal and sural nerves were within normal limits. The F wave latencies for the posterior tibial nerves were within normal limits.   EMG STUDIES:  EMG study was performed on the right lower extremity:  The tibialis anterior muscle reveals 2 to 4K motor units with full recruitment. No fibrillations or positive waves were seen. The peroneus tertius muscle reveals 2 to 4K motor units with full recruitment. No fibrillations or positive waves were seen. The medial gastrocnemius muscle reveals 1 to 3K motor units with full recruitment. No fibrillations or positive waves were seen. The vastus lateralis muscle reveals 2 to 4K motor units with full recruitment. No fibrillations or positive waves were seen. The iliopsoas muscle reveals 2 to 4K motor units with full recruitment. No fibrillations or positive waves were seen. The biceps femoris muscle (long head) reveals 2 to 4K motor units with full recruitment. No fibrillations or positive waves were seen. The lumbosacral paraspinal muscles were tested at 3 levels, and revealed no abnormalities of insertional activity at all 3 levels tested. There was good relaxation.   IMPRESSION:  Nerve conduction studies done on both lower extremities were within normal limits.  EMG evaluation of the right lower extremity is normal, no evidence of an overlying  lumbar radiculopathy was seen.  Jill Alexanders MD 11/09/2017 2:43 PM  [facility] [address] [place],  [address]  Phone [phone] Fax [phone]

## 2017-11-09 NOTE — Progress Notes (Addendum)
The patient comes in today for EMG and nerve conduction study evaluation.  Nerve conductions on both legs are normal, EMG on the right leg is normal.  The patient has multilevel arthritic changes by MRI of the lumbar spine done in 2017.  The patient claims that he cannot tolerate steroids secondary to psychosis.  Epidural steroid injection will not be done therefore.  He has not yet started back in clinic as he fears that this may worsen his asthma, he has sleep apnea and will be going for sleep study seen.  I have suggested trying Cymbalta, the patient does not wish to go on any other medications for now, he is taking Tylenol and Advil for his back pain.    [place]    Nerve / Sites Muscle Latency Ref. Amplitude Ref. Rel Amp Segments Distance Velocity Ref. Area    ms ms mV mV %  cm m/s m/s mVms  L Peroneal - EDB     Ankle EDB 4.4 ?6.5 5.7 ?2.0 100 Ankle - EDB 9   17.5     Fib head EDB 11.7  4.8  83 Fib head - Ankle 32 44 ?44 18.1     Pop fossa EDB 14.0  4.3  91 Pop fossa - Fib head 10 44 ?44 17.0         Pop fossa - Ankle      R Peroneal - EDB     Ankle EDB 4.4 ?6.5 8.4 ?2.0 100 Ankle - EDB 9   27.1     Fib head EDB 11.7  7.9  93.9 Fib head - Ankle 32 44 ?44 27.0     Pop fossa EDB 14.4  7.4  94.7 Pop fossa - Fib head 12 44 ?44 26.8         Pop fossa - Ankle      L Tibial - AH     Ankle AH 3.9 ?5.8 14.7 ?4.0 100 Ankle - AH 9   28.1     Pop fossa AH 13.9  9.9  67.3 Pop fossa - Ankle 41 41 ?41 23.6  R Tibial - AH     Ankle AH 4.3 ?5.8 13.9 ?4.0 100 Ankle - AH 9   24.1     Pop fossa AH 14.0  7.4  53.6 Pop fossa - Ankle 41 42 ?41 19.5             SNC    Nerve / Sites Rec. Site Peak Lat Ref.  Amp Ref. Segments Distance    ms ms V V  cm  L Sural - Ankle (Calf)     Calf Ankle 3.5 ?4.4 6 ?6 Calf - Ankle 14  R Sural - Ankle (Calf)     Calf Ankle 4.3 ?4.4 8 ?6 Calf - Ankle 14  L Superficial peroneal - Ankle     Lat leg Ankle 4.4 ?4.4 8 ?6 Lat leg - Ankle 14  R Superficial peroneal - Ankle      Lat leg Ankle 4.4 ?4.4 8 ?6 Lat leg - Ankle 14             F  Wave    Nerve F Lat Ref.   ms ms  L Tibial - AH 53.6 ?56.0  R Tibial - AH 52.8 ?56.0

## 2017-11-09 NOTE — Progress Notes (Signed)
Please refer to EMG and nerve conduction procedure note.  

## 2017-11-14 ENCOUNTER — Other Ambulatory Visit: Payer: Self-pay | Admitting: Student in an Organized Health Care Education/Training Program

## 2017-11-14 ENCOUNTER — Other Ambulatory Visit: Payer: Self-pay | Admitting: Oncology

## 2017-11-14 DIAGNOSIS — I10 Essential (primary) hypertension: Secondary | ICD-10-CM

## 2017-11-14 DIAGNOSIS — E785 Hyperlipidemia, unspecified: Secondary | ICD-10-CM

## 2017-11-22 ENCOUNTER — Telehealth: Payer: Self-pay | Admitting: *Deleted

## 2017-11-22 NOTE — Telephone Encounter (Signed)
I think that change is a good idea. It may help with his chronic cough.

## 2017-11-22 NOTE — Telephone Encounter (Signed)
rtc to pt, left vmail, encouraged call back

## 2017-11-22 NOTE — Telephone Encounter (Signed)
Pt calls and states dr wert recently changed him from losartan to valsartan, he has not started it yet, would like to know what dr Evette Doffing thinks before starting the valsartan

## 2017-12-14 ENCOUNTER — Ambulatory Visit (INDEPENDENT_AMBULATORY_CARE_PROVIDER_SITE_OTHER): Payer: Medicare Other | Admitting: Pulmonary Disease

## 2017-12-14 ENCOUNTER — Encounter: Payer: Self-pay | Admitting: Pulmonary Disease

## 2017-12-14 VITALS — BP 140/88 | HR 80 | Ht 75.0 in | Wt 378.0 lb

## 2017-12-14 DIAGNOSIS — G4719 Other hypersomnia: Secondary | ICD-10-CM | POA: Diagnosis not present

## 2017-12-14 DIAGNOSIS — Z9989 Dependence on other enabling machines and devices: Secondary | ICD-10-CM | POA: Diagnosis not present

## 2017-12-14 DIAGNOSIS — G4733 Obstructive sleep apnea (adult) (pediatric): Secondary | ICD-10-CM

## 2017-12-14 NOTE — Progress Notes (Signed)
Eduardo Duncan    [id number]    December 03, 1960  Primary Care Physician:Vincent, Mallie Mussel, MD  Referring Physician: Axel Filler, MD [address] [place], [place] [address]  Chief complaint:   Patient with a history of severe obstructive sleep apnea Having difficulty initiating sleep with his pressures  HPI:  He has a new machine about a year old Previous study was reviewed showing severe obstructive sleep apnea was titrated to CPAP of 13  Recently he notes on occasions that he has difficulty tolerating the pressure when it starts prior to ramping up, when he is asleep he does not get woken up with pressures He is aware of the ramp up effect of the machine  Has had sleep apnea for over 10 years Usually falls asleep between 3 and 4 AM, takes about 30 minutes to fall asleep Wakes up between 4 and 6 PM Wakes up feeling like is not a decent rest  His sleep cycle is the current pattern secondary to work-related adjustments in the past which he has not been able to correct  He is morbidly obese Disabled Continues to gain weight  Outpatient Encounter Medications as of 12/14/2017  Medication Sig  . acetaminophen (TYLENOL) 500 MG tablet Take 500 mg by mouth every 6 (six) hours as needed.  Marland Kitchen albuterol (PROAIR HFA) 108 (90 Base) MCG/ACT inhaler Inhale 2 puffs every 6 (six) hours as needed into the lungs for wheezing or shortness of breath.  Marland Kitchen aspirin (ASPIRIN ADULT LOW STRENGTH) 81 MG EC tablet Take 81 mg by mouth daily.    . baclofen (LIORESAL) 10 MG tablet Take 0.5 tablets (5 mg total) by mouth 2 (two) times daily.  . carvedilol (COREG) 12.5 MG tablet TAKE 1 TABLET BY MOUTH TWICE DAILY WITH MEALS  . cetirizine (ZYRTEC) 10 MG tablet Take 10 mg by mouth daily.  . famotidine (PEPCID) 20 MG tablet TAKE 1/2 (ONE-HALF) TABLET BY MOUTH TWICE DAILY  . ibuprofen (ADVIL,MOTRIN) 200 MG tablet Take 600 mg by mouth 3 (three) times daily as needed.   . simvastatin  (ZOCOR) 40 MG tablet TAKE 1 TABLET BY MOUTH AT BEDTIME  . UNABLE TO FIND Med Name: CPAP  . valsartan (DIOVAN) 160 MG tablet Take 1 tablet (160 mg total) by mouth daily.   No facility-administered encounter medications on file as of 12/14/2017.     Allergies as of 12/14/2017 - Review Complete 12/14/2017  Allergen Reaction Noted  . Ace inhibitors Cough 12/09/2010  . Advair diskus [fluticasone-salmeterol] Other (See Comments) 03/13/2010  . Codeine Nausea Only 03/15/2007  . Gabapentin Other (See Comments) 03/14/2014  . Montelukast sodium Other (See Comments) 03/13/2010  . Prednisone Other (See Comments) 03/13/2010  . Prednisolone Anxiety 06/06/2012    Past Medical History:  Diagnosis Date  . Asthma   . Cervicogenic headache 12/25/2012  . Depression   . Hyperlipidemia   . Hypertension   . Obesity   . Obstructive sleep apnea    On CPAP  . 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 Spinal stenosis of lumbar region     Past Surgical History:  Procedure Laterality Date  . CHOLECYSTECTOMY  07/2009   Lap  . CHOLECYSTECTOMY  07/11/2009  . COLONOSCOPY  01/2009    Family History  Problem Relation Age of Onset  . Diabetes Mother   . Hypertension Mother   . Alzheimer's disease Mother   . Heart disease Mother   . Heart disease  Father   . Colon cancer Brother 89  . Stroke Brother   . Cancer Maternal Grandmother   . Ovarian cancer Paternal Grandmother   . Coronary artery disease Brother 2    Social History   Socioeconomic History  . Marital status: Single    Spouse name: Not on file  . Number of children: 0  . Years of education: trade  . Highest education level: Not on file  Occupational History  . Occupation: disability     Employer: DISABLED    Comment: 10/2006--has not worked since 2004  Social Needs  . Financial resource strain: Not on file  . Food insecurity:    Worry: Not on file    Inability: Not on file  . Transportation needs:    Medical: Not on file     Non-medical: Not on file  Tobacco Use  . Smoking status: Former Smoker    Packs/day: 1.00    Years: 12.00    Pack years: 12.00    Types: Cigarettes    Last attempt to quit: 03/13/1986    Years since quitting: 31.7  . Smokeless tobacco: Never Used  Substance and Sexual Activity  . Alcohol use: No    Alcohol/week: 0.0 standard drinks    Comment: No alcohol for 2 years--used to drink heavily in late teens  . Drug use: No  . Sexual activity: Not Currently  Lifestyle  . Physical activity:    Days per week: Not on file    Minutes per session: Not on file  . Stress: Not on file  Relationships  . Social connections:    Talks on phone: Not on file    Gets together: Not on file    Attends religious service: Not on file    Active member of club or organization: Not on file    Attends meetings of clubs or organizations: Not on file    Relationship status: Not on file  . Intimate partner violence:    Fear of current or ex partner: Not on file    Emotionally abused: Not on file    Physically abused: Not on file    Forced sexual activity: Not on file  Other Topics Concern  . Not on file  Social History Narrative   Lives alone   Caffeine use: Drinks decaf unsweet tea   Coffee rare   Right-handed   Registration notes: Red bag given 05/10/08.       Review of Systems  Constitutional: Positive for fatigue.  HENT: Negative.   Respiratory: Positive for shortness of breath.   Cardiovascular: Positive for leg swelling. Negative for chest pain.  Gastrointestinal: Negative.   Musculoskeletal: Positive for arthralgias.  All other systems reviewed and are negative.   Vitals:   12/14/17 1437  BP: 140/88  Pulse: 80  SpO2: 98%     Physical Exam  Constitutional: He appears distressed.  Super obese  HENT:  Head: Normocephalic and atraumatic.  Mallampati 4  Eyes: Pupils are equal, round, and reactive to light. EOM are normal. Right eye exhibits no discharge. Left eye exhibits no  discharge.  Neck: Normal range of motion. Neck supple. No tracheal deviation present. No thyromegaly present.  Cardiovascular: Normal rate and regular rhythm.  Pulmonary/Chest: Effort normal and breath sounds normal. No respiratory distress. He has no wheezes.  Abdominal: Soft. Bowel sounds are normal. He exhibits no distension. There is no tenderness.    Data Reviewed: Pulmonary records reviewed  Assessment:  Morbid obesity  Obstructive sleep  apnea  Prescription chronic musculoskeletal pain  Polycythemia  Plan/Recommendations: We will try and obtain download from the patient's machine  The complaints of not having adequate pressures when he initially put place his CPAP on is likely related to an initial pressure that is too low for him-may need downgoing of his pressures  He will continue to use CPAP at present  Discussed about the need to work on weight loss and increased activity  I will see him back in the office in about 2-3 months  Encouraged to call with any significant concerns      Sherrilyn Rist MD [facility] 12/14/2017, 2:45 PM  CC: Axel Filler,*

## 2017-12-14 NOTE — Patient Instructions (Signed)
History of severe obstructive sleep apnea  Difficulty with sleep initiation, sensation of inadequate pressure  We will obtain a download from your machine and make changes to your CPAP therapy  We will call you as soon as we have download available  I will see you back in the office in about 2 months

## 2017-12-15 DIAGNOSIS — G4733 Obstructive sleep apnea (adult) (pediatric): Secondary | ICD-10-CM | POA: Diagnosis not present

## 2017-12-26 DIAGNOSIS — G4733 Obstructive sleep apnea (adult) (pediatric): Secondary | ICD-10-CM | POA: Diagnosis not present

## 2018-02-02 ENCOUNTER — Encounter: Payer: Self-pay | Admitting: *Deleted

## 2018-02-14 ENCOUNTER — Ambulatory Visit: Payer: Medicare Other | Admitting: Pulmonary Disease

## 2018-03-09 ENCOUNTER — Ambulatory Visit (INDEPENDENT_AMBULATORY_CARE_PROVIDER_SITE_OTHER): Payer: Medicare Other | Admitting: Pulmonary Disease

## 2018-03-09 ENCOUNTER — Encounter: Payer: Self-pay | Admitting: Pulmonary Disease

## 2018-03-09 VITALS — BP 130/70 | HR 68 | Ht 76.0 in | Wt 388.8 lb

## 2018-03-09 DIAGNOSIS — G4733 Obstructive sleep apnea (adult) (pediatric): Secondary | ICD-10-CM

## 2018-03-09 DIAGNOSIS — Z9989 Dependence on other enabling machines and devices: Secondary | ICD-10-CM | POA: Diagnosis not present

## 2018-03-09 NOTE — Patient Instructions (Signed)
DME for new mask evaluation  Decrease pressure from 15/11 to 14/10  Obtain download from new machine in about a month  I will see you back in the office in 3 months  Call with significant concerns

## 2018-03-09 NOTE — Progress Notes (Signed)
Eduardo Duncan    [id number]    Oct 06, 1960  Primary Care Physician:Vincent, Mallie Mussel, MD  Referring Physician: Axel Filler, MD [address] [place], [place] [address]  Chief complaint:   Patient with a history of severe obstructive sleep apnea Still having some issues with his pressures  HPI:  He has a new machine about a year old Pressure of 11-15, still feels good amount of leak Will switch to 10-14 We will try a different mask  Previous study was reviewed showing severe obstructive sleep apnea was titrated to CPAP of 13  Recently he notes on occasions that he has difficulty tolerating the pressure when it starts prior to ramping up, when he is asleep he does not get woken up with pressures He is aware of the ramp up effect of the machine  Has had sleep apnea for over 10 years Usually falls asleep between 3 and 4 AM, takes about 30 minutes to fall asleep Wakes up between 4 and 6 PM Wakes up feeling like is not a decent rest  His sleep cycle is the current pattern secondary to work-related adjustments in the past which he has not been able to correct  He is morbidly obese Disabled Continues to gain weight  Outpatient Encounter Medications as of 03/09/2018  Medication Sig  . acetaminophen (TYLENOL) 500 MG tablet Take 500 mg by mouth every 6 (six) hours as needed.  Marland Kitchen albuterol (PROAIR HFA) 108 (90 Base) MCG/ACT inhaler Inhale 2 puffs every 6 (six) hours as needed into the lungs for wheezing or shortness of breath.  Marland Kitchen aspirin (ASPIRIN ADULT LOW STRENGTH) 81 MG EC tablet Take 81 mg by mouth daily.    . baclofen (LIORESAL) 10 MG tablet Take 0.5 tablets (5 mg total) by mouth 2 (two) times daily.  . carvedilol (COREG) 12.5 MG tablet TAKE 1 TABLET BY MOUTH TWICE DAILY WITH MEALS  . cetirizine (ZYRTEC) 10 MG tablet Take 10 mg by mouth daily.  . famotidine (PEPCID) 20 MG tablet TAKE 1/2 (ONE-HALF) TABLET BY MOUTH TWICE DAILY  . ibuprofen  (ADVIL,MOTRIN) 200 MG tablet Take 600 mg by mouth 3 (three) times daily as needed.   . simvastatin (ZOCOR) 40 MG tablet TAKE 1 TABLET BY MOUTH AT BEDTIME  . UNABLE TO FIND Med Name: CPAP  . valsartan (DIOVAN) 160 MG tablet Take 1 tablet (160 mg total) by mouth daily.   No facility-administered encounter medications on file as of 03/09/2018.     Allergies as of 03/09/2018 - Review Complete 03/09/2018  Allergen Reaction Noted  . Ace inhibitors Cough 12/09/2010  . Advair diskus [fluticasone-salmeterol] Other (See Comments) 03/13/2010  . Codeine Nausea Only 03/15/2007  . Gabapentin Other (See Comments) 03/14/2014  . Montelukast sodium Other (See Comments) 03/13/2010  . Prednisone Other (See Comments) 03/13/2010  . Prednisolone Anxiety 06/06/2012    Past Medical History:  Diagnosis Date  . Asthma   . Cervicogenic headache 12/25/2012  . Depression   . Hyperlipidemia   . Hypertension   . Obesity   . Obstructive sleep apnea    On CPAP  . 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 Spinal stenosis of lumbar region     Past Surgical History:  Procedure Laterality Date  . CHOLECYSTECTOMY  07/2009   Lap  . CHOLECYSTECTOMY  07/11/2009  . COLONOSCOPY  01/2009    Family History  Problem Relation Age of Onset  . Diabetes Mother   .  Hypertension Mother   . Alzheimer's disease Mother   . Heart disease Mother   . Heart disease Father   . Colon cancer Brother 2  . Stroke Brother   . Cancer Maternal Grandmother   . Ovarian cancer Paternal Grandmother   . Coronary artery disease Brother 49    Social History   Socioeconomic History  . Marital status: Single    Spouse name: Not on file  . Number of children: 0  . Years of education: trade  . Highest education level: Not on file  Occupational History  . Occupation: disability     Employer: DISABLED    Comment: 10/2006--has not worked since 2004  Social Needs  . Financial resource strain: Not on file  . Food insecurity:    Worry: Not  on file    Inability: Not on file  . Transportation needs:    Medical: Not on file    Non-medical: Not on file  Tobacco Use  . Smoking status: Former Smoker    Packs/day: 1.00    Years: 12.00    Pack years: 12.00    Types: Cigarettes    Last attempt to quit: 03/13/1986    Years since quitting: 32.0  . Smokeless tobacco: Never Used  Substance and Sexual Activity  . Alcohol use: No    Alcohol/week: 0.0 standard drinks    Comment: No alcohol for 2 years--used to drink heavily in late teens  . Drug use: No  . Sexual activity: Not Currently  Lifestyle  . Physical activity:    Days per week: Not on file    Minutes per session: Not on file  . Stress: Not on file  Relationships  . Social connections:    Talks on phone: Not on file    Gets together: Not on file    Attends religious service: Not on file    Active member of club or organization: Not on file    Attends meetings of clubs or organizations: Not on file    Relationship status: Not on file  . Intimate partner violence:    Fear of current or ex partner: Not on file    Emotionally abused: Not on file    Physically abused: Not on file    Forced sexual activity: Not on file  Other Topics Concern  . Not on file  Social History Narrative   Lives alone   Caffeine use: Drinks decaf unsweet tea   Coffee rare   Right-handed   Registration notes: Red bag given 05/10/08.       Review of Systems  Constitutional: Positive for fatigue.  HENT: Negative.   Respiratory: Positive for shortness of breath.   Cardiovascular: Negative for chest pain and leg swelling.  Gastrointestinal: Negative.   Musculoskeletal: Positive for arthralgias.  All other systems reviewed and are negative.   Vitals:   03/09/18 1641  BP: 130/70  Pulse: 68  SpO2: 97%     Physical Exam  Constitutional: He appears distressed.  Super obese  HENT:  Head: Normocephalic and atraumatic.  Mallampati 4  Eyes: Pupils are equal, round, and reactive to  light. Conjunctivae and EOM are normal. Right eye exhibits no discharge. Left eye exhibits no discharge.  Neck: Normal range of motion. Neck supple. No tracheal deviation present. No thyromegaly present.  Cardiovascular: Normal rate and regular rhythm.  Pulmonary/Chest: Effort normal and breath sounds normal. No respiratory distress. He has no wheezes.  Abdominal: Soft. Bowel sounds are normal. He exhibits no distension.  There is no abdominal tenderness.    Data Reviewed: Pulmonary records reviewed  Compliance data shows an AHI of 7.2 but with significant leaks  Assessment:  Morbid obesity -Unable to lose weight because of chronic pain  Obstructive sleep apnea -Continue use of CPAP -Reduce pressures  Prescription chronic musculoskeletal pain  Polycythemia  Plan/Recommendations: We will try and obtain download from the patient's machine in about a month  Because of the persistence of leaks we will reduce his pressures to 10-14 He will continue to use CPAP at present  DME referral for a new mask fit  Discussed about the need to work on weight loss and increased activity  I will see him back in the office in about 3 months  Encouraged to call with any significant concerns    Sherrilyn Rist MD [facility] 03/09/2018, 5:06 PM  CC: Axel Filler,*

## 2018-03-10 ENCOUNTER — Telehealth: Payer: Self-pay | Admitting: Pulmonary Disease

## 2018-03-10 NOTE — Telephone Encounter (Signed)
I have sent order to [facility].

## 2018-03-10 NOTE — Telephone Encounter (Signed)
Called and spoke to pt.  Pt states his DME company is [employer]. Will route message to [facility] to make them aware, as order did not include DME per documentation.

## 2018-03-30 DIAGNOSIS — G4733 Obstructive sleep apnea (adult) (pediatric): Secondary | ICD-10-CM | POA: Diagnosis not present

## 2018-03-30 DIAGNOSIS — G4730 Sleep apnea, unspecified: Secondary | ICD-10-CM | POA: Diagnosis not present

## 2018-05-10 ENCOUNTER — Other Ambulatory Visit: Payer: Self-pay | Admitting: *Deleted

## 2018-05-10 DIAGNOSIS — I10 Essential (primary) hypertension: Secondary | ICD-10-CM

## 2018-05-10 MED ORDER — CARVEDILOL 12.5 MG PO TABS: 12.5000 mg | ORAL_TABLET | Freq: Two times a day (BID) | ORAL | 1 refills | Status: DC

## 2018-07-04 DIAGNOSIS — G4730 Sleep apnea, unspecified: Secondary | ICD-10-CM | POA: Diagnosis not present

## 2018-07-04 DIAGNOSIS — G4733 Obstructive sleep apnea (adult) (pediatric): Secondary | ICD-10-CM | POA: Diagnosis not present

## 2018-08-06 ENCOUNTER — Other Ambulatory Visit: Payer: Self-pay | Admitting: Student in an Organized Health Care Education/Training Program

## 2018-08-14 ENCOUNTER — Encounter: Payer: Medicare Other | Admitting: Student in an Organized Health Care Education/Training Program

## 2018-08-28 ENCOUNTER — Other Ambulatory Visit: Payer: Self-pay

## 2018-08-28 ENCOUNTER — Ambulatory Visit (INDEPENDENT_AMBULATORY_CARE_PROVIDER_SITE_OTHER): Payer: Medicare Other | Admitting: Student in an Organized Health Care Education/Training Program

## 2018-08-28 ENCOUNTER — Encounter: Payer: Self-pay | Admitting: Student in an Organized Health Care Education/Training Program

## 2018-08-28 VITALS — BP 135/73 | HR 61 | Temp 99.1°F | Ht 76.0 in | Wt 380.7 lb

## 2018-08-28 DIAGNOSIS — G8929 Other chronic pain: Secondary | ICD-10-CM

## 2018-08-28 DIAGNOSIS — M545 Low back pain: Secondary | ICD-10-CM | POA: Diagnosis not present

## 2018-08-28 DIAGNOSIS — Z79899 Other long term (current) drug therapy: Secondary | ICD-10-CM

## 2018-08-28 DIAGNOSIS — R5383 Other fatigue: Secondary | ICD-10-CM

## 2018-08-28 DIAGNOSIS — I10 Essential (primary) hypertension: Secondary | ICD-10-CM | POA: Diagnosis not present

## 2018-08-28 DIAGNOSIS — G4733 Obstructive sleep apnea (adult) (pediatric): Secondary | ICD-10-CM

## 2018-08-28 DIAGNOSIS — Z6841 Body Mass Index (BMI) 40.0 and over, adult: Secondary | ICD-10-CM

## 2018-08-28 DIAGNOSIS — M19072 Primary osteoarthritis, left ankle and foot: Secondary | ICD-10-CM

## 2018-08-28 DIAGNOSIS — M79672 Pain in left foot: Secondary | ICD-10-CM

## 2018-08-28 NOTE — Patient Instructions (Signed)
Today we talked about your high blood pressure.  It was a little high in the office today, but I am encouraged that it is normal at home.  Please continue with valsartan 160 mg daily.  I am going to check your blood work today to make sure your kidney function and electrolytes are stable.  We talked about your chronic sinus inflammation.  I would encourage you to try a Nettie pot for sinus irrigation as below pressure might be more comfortable.  Okay to continue with Flonase.

## 2018-08-28 NOTE — Assessment & Plan Note (Signed)
Patient with a chronic intermittent left foot pain.  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  Other history sounds like intermittent Achilles tendinitis and posterior tibial tendinitis.  This is all complicated by severe obesity.  He wonders if orthotics would be helpful.  I am going to refer him to try a foot and ankle to consider orthotic treatment.

## 2018-08-28 NOTE — Progress Notes (Signed)
   Assessment and Plan:  See Encounters tab for problem-based medical decision making.   __________________________________________________________  HPI:   58 year old man here for follow-up of hypertension.  Reports doing fairly well at home.  Still struggles with fatigue throughout the week.  He reports good compliance with his medications.  Had some trouble obtaining famotidine recently.  Denies any fevers or chills.  No recent illnesses.  Reports compliance with COVID-19 restrictions and physical distancing guidelines.  Reports his blood pressures at home have been in good range.  Denies any chest pain or shortness of breath.  Reports good compliance with CPAP with humidified oxygen.  Reports some sinus irritation and intolerance to sinus irrigation due to burning.  He reports some lower extremity swelling at the end of the day after standing for prolonged period.  __________________________________________________________  Problem List: Patient Active Problem List   Diagnosis Date Noted  . Morbid obesity ([place]) 11/22/2005    Priority: High  . Essential hypertension 11/22/2005    Priority: High    Class: Chronic  . Cough variant asthma vs uacs  09/19/2017    Priority: Medium  . Allergic rhinitis 12/23/2010    Priority: Medium  . Obstructive sleep apnea 04/03/2007    Priority: Medium  . Asthma 11/22/2005    Priority: Medium  . Fatigue 08/15/2017    Priority: Low  . Encounter for preventive care 04/26/2016    Priority: Low  . Spinal stenosis in cervical region 11/19/2013    Priority: Low  . Left foot pain 02/13/2013    Priority: Low  . Headache 12/25/2012    Priority: Low  . Spinal stenosis in lumbar region 10/17/2008    Priority: Low  . GERD 06/23/2006    Priority: Low    Medications: Reconciled today in Epic __________________________________________________________  Physical Exam:  Vital Signs: Vitals:   08/28/18 1058 08/28/18 1129  BP: (!) 157/67 135/73   Pulse: 68 61  Temp: 99.1 F (37.3 C)   TempSrc: Oral   SpO2: 98%   Weight: (!) 380 lb 11.2 oz (172.7 kg)   Height: 6\' 4"  (1.93 m)     Gen: Well appearing, NAD CV: RRR, no murmurs, distant heart sounds Pulm: Distant lung sounds, normal effort, CTA throughout, no wheezing Skin: No atypical appearing moles. No rashes Neuro: Alert, conversational, normal strength in the upper and lower extremities and normal gait

## 2018-08-28 NOTE — Assessment & Plan Note (Signed)
Blood pressure well controlled today in the clinic, home readings also seem appropriate.  Plan to continue with valsartan 160 mg daily.  In the past he has been on lisinopril and losartan, but those were changed due to persistent chronic cough.  Will check BMP today.

## 2018-08-28 NOTE — Assessment & Plan Note (Addendum)
Weight is 380 pounds, down 8 pounds in the last 6 months.  BMI 46.  Patient continues to try to improve nutrition, reduce carbohydrates and processed foods.  Unable to exercise due to chronic low back pain.  He is feeling a little discouraged by the lack of progress in weight loss.  He has hypertension but no history of diabetes.  He is on simvastatin for primary prevention of ischemic vascular disease and I will check his lipids today.

## 2018-08-29 LAB — LIPID PANEL
Chol/HDL Ratio: 3.5 ratio (ref 0.0–5.0)
Cholesterol, Total: 135 mg/dL (ref 100–199)
HDL: 39 mg/dL — ABNORMAL LOW (ref >39)
LDL Calculated: 71 mg/dL (ref 0–99)
Triglycerides: 124 mg/dL (ref 0–149)
VLDL Cholesterol Cal: 25 mg/dL (ref 5–40)

## 2018-08-29 NOTE — Addendum Note (Signed)
Addended by: Lalla Brothers T on: 08/29/2018 02:37 PM   Modules accepted: Orders

## 2018-08-31 ENCOUNTER — Encounter: Payer: Self-pay | Admitting: Student in an Organized Health Care Education/Training Program

## 2018-08-31 LAB — BMP8+ANION GAP
Anion Gap: 18 mmol/L (ref 10.0–18.0)
BUN/Creatinine Ratio: 12 (ref 9–20)
BUN: 14 mg/dL (ref 6–24)
CO2: 18 mmol/L — ABNORMAL LOW (ref 20–29)
Calcium: 9.2 mg/dL (ref 8.7–10.2)
Chloride: 104 mmol/L (ref 96–106)
Creatinine, Ser: 1.13 mg/dL (ref 0.76–1.27)
GFR calc Af Amer: 82 mL/min/{1.73_m2} (ref >59)
GFR calc non Af Amer: 71 mL/min/{1.73_m2} (ref >59)
Glucose: 89 mg/dL (ref 65–99)
Potassium: 4.1 mmol/L (ref 3.5–5.2)
Sodium: 140 mmol/L (ref 134–144)

## 2018-08-31 LAB — SPECIMEN STATUS REPORT

## 2018-09-06 ENCOUNTER — Encounter: Payer: Self-pay | Admitting: *Deleted

## 2018-10-03 DIAGNOSIS — G4730 Sleep apnea, unspecified: Secondary | ICD-10-CM | POA: Diagnosis not present

## 2018-10-03 DIAGNOSIS — G4733 Obstructive sleep apnea (adult) (pediatric): Secondary | ICD-10-CM | POA: Diagnosis not present

## 2018-10-09 NOTE — Addendum Note (Signed)
Addended by: Hulan Fray on: 10/09/2018 06:10 PM   Modules accepted: Orders

## 2018-11-06 ENCOUNTER — Other Ambulatory Visit: Payer: Self-pay | Admitting: Internal Medicine

## 2018-11-06 ENCOUNTER — Other Ambulatory Visit: Payer: Self-pay | Admitting: Student in an Organized Health Care Education/Training Program

## 2018-11-06 DIAGNOSIS — I10 Essential (primary) hypertension: Secondary | ICD-10-CM

## 2018-11-06 DIAGNOSIS — E785 Hyperlipidemia, unspecified: Secondary | ICD-10-CM

## 2018-11-09 ENCOUNTER — Telehealth: Payer: Self-pay | Admitting: *Deleted

## 2018-11-09 DIAGNOSIS — H269 Unspecified cataract: Secondary | ICD-10-CM

## 2018-11-09 NOTE — Telephone Encounter (Signed)
Call from patient stating he has cataracts and needs a referral to Dr Katy Fitch to have them removed.  Pt also requesting an order for a "so clean" cpap sanitizing machine ( DME Other) Will forward message to pcp to for review.  Please advise.Despina Hidden Cassady10/29/20204:13 PM

## 2018-11-10 NOTE — Telephone Encounter (Signed)
[facility] referral is fine.   I am not sure about the cpap cleaning machine. Is this actually DME? Is this something his DME company should already be providing. Do they really need an order from me saying he should have a clean cpap machine?

## 2018-11-13 ENCOUNTER — Telehealth: Payer: Self-pay | Admitting: *Deleted

## 2018-11-13 DIAGNOSIS — G4733 Obstructive sleep apnea (adult) (pediatric): Secondary | ICD-10-CM

## 2018-11-13 NOTE — Telephone Encounter (Signed)
Pt states he has been seeing scott groat and would like the referral to him, he states he does not want to go anywhere else. Also he wants an appt 12/21 at 1045 w/ dr Evette Doffing. Also he states his insurance company told him that if dr Evette Doffing would order a cleaning machine for a cpap and state it would lessen chances of sinus and resp infections that they would pay.

## 2018-11-14 ENCOUNTER — Telehealth: Payer: Self-pay | Admitting: *Deleted

## 2018-11-14 NOTE — Telephone Encounter (Signed)
Ok. I have ordered it. Can someone print the script and send it to Eduardo Duncan please?

## 2018-11-14 NOTE — Telephone Encounter (Signed)
Returned call to patient. No answer. Left message on VM requesting return call. L. [place], RN, BSN     

## 2018-11-14 NOTE — Telephone Encounter (Signed)
Community message sent to Jolee Ewing at [facility] to see if they sell these machines and if insurance will cover. Hubbard Hartshorn, BSN, RN-BC

## 2018-11-14 NOTE — Telephone Encounter (Signed)
I think I addressed these issues in the last telephone note. I was looking for more info on the cpap cleaning, I thought that is something the DME company handles. I have never ordered that before.

## 2018-11-14 NOTE — Telephone Encounter (Signed)
[place], Germain Osgood, Orvis Brill, RN; [place], Stanford Breed, [place]; [place], [place], [place]; [place]on Alas        [place], They are private pay retail store only items. They range from $99 to $350.00. Once he private pays for it, he can file it with his insurance if they are advising they will cover it.   He can take the rx into any one of our retail locations.   Thanks

## 2018-11-14 NOTE — Telephone Encounter (Signed)
Pt called back, gave him message from note, he states he will call [employer]

## 2018-11-20 NOTE — Telephone Encounter (Signed)
Rx for CPAP cleaning machine mailed to patient's home address. Hubbard Hartshorn, BSN, RN-BC

## 2019-01-01 ENCOUNTER — Encounter: Payer: Medicare Other | Admitting: Student in an Organized Health Care Education/Training Program

## 2019-01-03 DIAGNOSIS — G4730 Sleep apnea, unspecified: Secondary | ICD-10-CM | POA: Diagnosis not present

## 2019-01-03 DIAGNOSIS — G4733 Obstructive sleep apnea (adult) (pediatric): Secondary | ICD-10-CM | POA: Diagnosis not present

## 2019-01-23 ENCOUNTER — Encounter: Payer: Self-pay | Admitting: *Deleted

## 2019-02-05 ENCOUNTER — Other Ambulatory Visit: Payer: Self-pay | Admitting: Internal Medicine

## 2019-02-08 ENCOUNTER — Other Ambulatory Visit: Payer: Self-pay | Admitting: Internal Medicine

## 2019-02-08 MED ORDER — VALSARTAN 160 MG PO TABS: 160.0000 mg | ORAL_TABLET | Freq: Every day | ORAL | 3 refills | Status: DC

## 2019-02-08 NOTE — Telephone Encounter (Signed)
Need refill on valsartan (DIOVAN) 160 MG tablet  ;pt contact [facility]

## 2019-02-08 NOTE — Addendum Note (Signed)
Addended by: Lalla Brothers T on: 02/08/2019 03:54 PM   Modules accepted: Orders

## 2019-02-26 ENCOUNTER — Ambulatory Visit: Payer: Medicare Other | Admitting: Student in an Organized Health Care Education/Training Program

## 2019-03-28 DIAGNOSIS — H25813 Combined forms of age-related cataract, bilateral: Secondary | ICD-10-CM | POA: Diagnosis not present

## 2019-04-04 DIAGNOSIS — G4730 Sleep apnea, unspecified: Secondary | ICD-10-CM | POA: Diagnosis not present

## 2019-04-04 DIAGNOSIS — G4733 Obstructive sleep apnea (adult) (pediatric): Secondary | ICD-10-CM | POA: Diagnosis not present

## 2019-04-30 ENCOUNTER — Other Ambulatory Visit: Payer: Self-pay

## 2019-04-30 ENCOUNTER — Ambulatory Visit (INDEPENDENT_AMBULATORY_CARE_PROVIDER_SITE_OTHER): Payer: Medicare Other | Admitting: Student in an Organized Health Care Education/Training Program

## 2019-04-30 ENCOUNTER — Encounter: Payer: Self-pay | Admitting: Student in an Organized Health Care Education/Training Program

## 2019-04-30 VITALS — BP 146/84 | HR 76 | Temp 98.8°F | Ht 76.0 in | Wt 389.3 lb

## 2019-04-30 DIAGNOSIS — R600 Localized edema: Secondary | ICD-10-CM

## 2019-04-30 DIAGNOSIS — M545 Low back pain: Secondary | ICD-10-CM | POA: Diagnosis not present

## 2019-04-30 DIAGNOSIS — M4802 Spinal stenosis, cervical region: Secondary | ICD-10-CM | POA: Diagnosis not present

## 2019-04-30 DIAGNOSIS — G4733 Obstructive sleep apnea (adult) (pediatric): Secondary | ICD-10-CM

## 2019-04-30 DIAGNOSIS — Z79899 Other long term (current) drug therapy: Secondary | ICD-10-CM

## 2019-04-30 DIAGNOSIS — J3089 Other allergic rhinitis: Secondary | ICD-10-CM

## 2019-04-30 DIAGNOSIS — Z6841 Body Mass Index (BMI) 40.0 and over, adult: Secondary | ICD-10-CM

## 2019-04-30 DIAGNOSIS — I10 Essential (primary) hypertension: Secondary | ICD-10-CM | POA: Diagnosis not present

## 2019-04-30 DIAGNOSIS — M48061 Spinal stenosis, lumbar region without neurogenic claudication: Secondary | ICD-10-CM | POA: Diagnosis not present

## 2019-04-30 DIAGNOSIS — J309 Allergic rhinitis, unspecified: Secondary | ICD-10-CM

## 2019-04-30 DIAGNOSIS — Z791 Long term (current) use of non-steroidal anti-inflammatories (NSAID): Secondary | ICD-10-CM

## 2019-04-30 DIAGNOSIS — Z9989 Dependence on other enabling machines and devices: Secondary | ICD-10-CM

## 2019-04-30 NOTE — Assessment & Plan Note (Signed)
Flare of allergic rhinitis which is to be expected this spring due to pollen burden.  Plan to continue cetirizine 10 mg daily.  Can increase to 20 mg daily if needed.  He does experience some sedation even with the selective antihistamines.

## 2019-04-30 NOTE — Progress Notes (Signed)
   Assessment and Plan:  See Encounters tab for problem-based medical decision making.   __________________________________________________________  HPI:   59 year old person living with obesity here for follow-up of hypertension.  Doing fairly well though he is experiencing increasing low back pain and weakness in his left leg.  He lives independently and is very functional.  He has a history of spinal stenosis both in his cervical and lumbar spines.  He is working to follow-up with his neurosurgeon when able.  I offered him physical therapy to improve strength.  Feels like this pain has been exacerbated when he recently remove the head gaskets from his truck's engine.  Other issues have included he is allergic rhinitis which is worse now due to the pollen burden.  He uses cetirizine, occasionally uses Allegra instead.  Not yet had Covid vaccine, he does not have transportation to obtain this easily.  __________________________________________________________  Problem List: Patient Active Problem List   Diagnosis Date Noted  . Morbid obesity ([place]) 11/22/2005    Priority: High  . Essential hypertension 11/22/2005    Priority: High    Class: Chronic  . Bilateral lower extremity edema 04/30/2019    Priority: Medium  . Cough variant asthma vs uacs  09/19/2017    Priority: Medium  . Allergic rhinitis 12/23/2010    Priority: Medium  . Obstructive sleep apnea 04/03/2007    Priority: Medium  . Asthma 11/22/2005    Priority: Medium  . Fatigue 08/15/2017    Priority: Low  . Encounter for preventive care 04/26/2016    Priority: Low  . Spinal stenosis in cervical region 11/19/2013    Priority: Low  . Left foot pain 02/13/2013    Priority: Low  . Headache 12/25/2012    Priority: Low  . Spinal stenosis in lumbar region 10/17/2008    Priority: Low  . GERD 06/23/2006    Priority: Low    Medications: Reconciled today in  Epic __________________________________________________________  Physical Exam:  Vital Signs: Vitals:   04/30/19 1033  BP: (!) 146/84  Pulse: 76  Temp: 98.8 F (37.1 C)  TempSrc: Oral  SpO2: 98%  Weight: (!) 389 lb 4.8 oz (176.6 kg)  Height: 6\' 4"  (1.93 m)    Gen: Well appearing, NAD  CV: RRR, no murmurs, distant heart sounds Pulm: Normal effort, CTA throughout, no wheezing Ext: Warm, 1+ pitting edema bilateral lower legs Skin: Mild stasis dermatitis of the left dorsal foot

## 2019-04-30 NOTE — Assessment & Plan Note (Signed)
Bilateral lower extremity edema today, likely related to low functional status and obesity.  Low suspicion for heart failure.  We talked about supportive care including compression stockings, increase ambulation, and leg elevation.  I want to avoid diuretics if I can as he has a history of orthostatic dizziness.

## 2019-04-30 NOTE — Assessment & Plan Note (Signed)
Good compliance with CPAP.  He is working with his home health company to maintain and clean his device.

## 2019-04-30 NOTE — Assessment & Plan Note (Signed)
Blood pressure okay today.  He reports home blood pressure readings much better controlled, systolics around [id number].  Also a history of orthostatic dizziness.  Plan is to continue valsartan 160 mg daily along with carvedilol 12.5 mg twice daily.  Check BMP today.  He continues to use daily NSAIDs because of osteoarthritis and low back pain.  This puts him at risk for kidney injury which she understands.  We will follow his kidney function closely.

## 2019-04-30 NOTE — Assessment & Plan Note (Signed)
Weight 389 pounds with a BMI of 47.  Fairly stable over the last 1 year.  Okay functional status, lives independently at home.  He continues to work on Occupational psychologist.  Exercise limited by osteoarthritis.

## 2019-05-01 LAB — LIPID PANEL
Chol/HDL Ratio: 3.3 ratio (ref 0.0–5.0)
Cholesterol, Total: 148 mg/dL (ref 100–199)
HDL: 45 mg/dL (ref >39)
LDL Chol Calc (NIH): 83 mg/dL (ref 0–99)
Triglycerides: 112 mg/dL (ref 0–149)
VLDL Cholesterol Cal: 20 mg/dL (ref 5–40)

## 2019-05-01 LAB — BMP8+ANION GAP
Anion Gap: 15 mmol/L (ref 10.0–18.0)
BUN/Creatinine Ratio: 12 (ref 10–24)
BUN: 12 mg/dL (ref 8–27)
CO2: 20 mmol/L (ref 20–29)
Calcium: 9.3 mg/dL (ref 8.6–10.2)
Chloride: 104 mmol/L (ref 96–106)
Creatinine, Ser: 1 mg/dL (ref 0.76–1.27)
GFR calc Af Amer: 94 mL/min/{1.73_m2} (ref >59)
GFR calc non Af Amer: 81 mL/min/{1.73_m2} (ref >59)
Glucose: 96 mg/dL (ref 65–99)
Potassium: 4.4 mmol/L (ref 3.5–5.2)
Sodium: 139 mmol/L (ref 134–144)

## 2019-05-04 ENCOUNTER — Other Ambulatory Visit: Payer: Self-pay | Admitting: Student in an Organized Health Care Education/Training Program

## 2019-05-04 DIAGNOSIS — J45909 Unspecified asthma, uncomplicated: Secondary | ICD-10-CM

## 2019-05-04 MED ORDER — ALBUTEROL SULFATE HFA 108 (90 BASE) MCG/ACT IN AERS: 2.0000 | INHALATION_SPRAY | Freq: Four times a day (QID) | RESPIRATORY_TRACT | 3 refills | Status: DC | PRN

## 2019-05-04 NOTE — Telephone Encounter (Signed)
Need refill on albuterol (PROAIR HFA) 108 (90 Base) MCG/ACT inhaler  pt contact [facility]

## 2019-05-07 ENCOUNTER — Encounter: Payer: Self-pay | Admitting: Student in an Organized Health Care Education/Training Program

## 2019-07-04 DIAGNOSIS — G4730 Sleep apnea, unspecified: Secondary | ICD-10-CM | POA: Diagnosis not present

## 2019-07-04 DIAGNOSIS — G4733 Obstructive sleep apnea (adult) (pediatric): Secondary | ICD-10-CM | POA: Diagnosis not present

## 2019-10-02 DIAGNOSIS — G4733 Obstructive sleep apnea (adult) (pediatric): Secondary | ICD-10-CM | POA: Diagnosis not present

## 2019-10-02 DIAGNOSIS — G4730 Sleep apnea, unspecified: Secondary | ICD-10-CM | POA: Diagnosis not present

## 2019-10-03 DIAGNOSIS — G4730 Sleep apnea, unspecified: Secondary | ICD-10-CM | POA: Diagnosis not present

## 2019-10-03 DIAGNOSIS — G4733 Obstructive sleep apnea (adult) (pediatric): Secondary | ICD-10-CM | POA: Diagnosis not present

## 2019-10-29 ENCOUNTER — Other Ambulatory Visit: Payer: Self-pay | Admitting: Student in an Organized Health Care Education/Training Program

## 2019-10-29 DIAGNOSIS — I10 Essential (primary) hypertension: Secondary | ICD-10-CM

## 2019-10-29 DIAGNOSIS — E785 Hyperlipidemia, unspecified: Secondary | ICD-10-CM

## 2019-10-30 DIAGNOSIS — H25813 Combined forms of age-related cataract, bilateral: Secondary | ICD-10-CM | POA: Diagnosis not present

## 2020-01-02 DIAGNOSIS — G4730 Sleep apnea, unspecified: Secondary | ICD-10-CM | POA: Diagnosis not present

## 2020-01-02 DIAGNOSIS — G4733 Obstructive sleep apnea (adult) (pediatric): Secondary | ICD-10-CM | POA: Diagnosis not present

## 2020-01-28 ENCOUNTER — Other Ambulatory Visit: Payer: Self-pay | Admitting: Student in an Organized Health Care Education/Training Program

## 2020-02-13 DIAGNOSIS — H25813 Combined forms of age-related cataract, bilateral: Secondary | ICD-10-CM | POA: Diagnosis not present

## 2020-02-13 DIAGNOSIS — H43813 Vitreous degeneration, bilateral: Secondary | ICD-10-CM | POA: Diagnosis not present

## 2020-04-02 ENCOUNTER — Encounter: Payer: Self-pay | Admitting: *Deleted

## 2020-04-02 NOTE — Progress Notes (Signed)
Things That May Be Affecting Your Health:  Alcohol  Hearing loss  Pain   x Depression  Home Safety  Sexual Health   Diabetes  Lack of physical activity  Stress   Difficulty with daily activities x Loneliness  Tiredness   Drug use  Medicines  Tobacco use   Falls  Motor Vehicle Safety  Weight   Food choices x Oral Health  Other    YOUR PERSONALIZED HEALTH PLAN : 1. Schedule your next subsequent Medicare Wellness visit in one year 2. Attend all of your regular appointments to address your medical issues 3. Complete the preventative screenings and services   Annual Wellness Visit   Medicare Covered Preventative Screenings and [place] Men and Women Who How Often Need? Date of Last Service Action  Abdominal Aortic Aneurysm Adults with AAA risk factors Once      Alcohol Misuse and Counseling All Adults Screening once a year if no alcohol misuse. Counseling up to 4 face to face sessions.     Bone Density Measurement  Adults at risk for osteoporosis Once every 2 yrs      Lipid Panel Z13.6 All adults without CV disease Once every 5 yrs       Colorectal Cancer   Stool sample or  Colonoscopy All adults 34 and older   Once every year  Every 10 years        Depression All Adults Once a year  Today   Diabetes Screening Blood glucose, post glucose load, or GTT Z13.1  All adults at risk  Pre-diabetics  Once per year  Twice per year      Diabetes  Self-Management Training All adults Diabetics 10 hrs first year; 2 hours subsequent years. Requires Copay     Glaucoma  Diabetics  Family history of glaucoma  African Americans 42 yrs +  Hispanic Americans 70 yrs + Annually - requires coppay      Hepatitis C Z72.89 or F19.20  High Risk for HCV  Born between 1945 and 1965  Annually  Once      HIV Z11.4 All adults based on risk  Annually btw ages 51 & 44 regardless of risk  Annually > 65 yrs if at increased risk      Lung Cancer Screening  Asymptomatic adults aged 30-77 with 30 pack yr history and current smoker OR quit within the last 15 yrs Annually Must have counseling and shared decision making documentation before first screen      Medical Nutrition Therapy Adults with   Diabetes  Renal disease  Kidney transplant within past 3 yrs 3 hours first year; 2 hours subsequent years     Obesity and Counseling All adults Screening once a year Counseling if BMI 30 or higher  Today   Tobacco Use Counseling Adults who use tobacco  Up to 8 visits in one year     Vaccines Z23  Hepatitis B  Influenza   Pneumonia  Adults   Once  Once every flu season  Two different vaccines separated by one year     Next Annual Wellness Visit People with Medicare Every year  Today     Services & Screenings Women Who How Often Need  Date of Last Service Action  Mammogram  Z12.31 Women over 34 One baseline ages 3-39. Annually ager 40 yrs+      Pap tests All women Annually if high risk. Every 2 yrs for normal risk women  Screening for cervical cancer with   Pap (Z01.419 nl or Z01.411abnl) &  HPV Z11.51 Women aged 41 to 29 Once every 5 yrs     Screening pelvic and breast exams All women Annually if high risk. Every 2 yrs for normal risk women     Sexually Transmitted Diseases  Chlamydia  Gonorrhea  Syphilis All at risk adults Annually for non pregnant females at increased risk         [place] Men Who How Ofter Need  Date of Last Service Action  Prostate Cancer - DRE & PSA Men over 50 Annually.  DRE might require a copay.        Sexually Transmitted Diseases  Syphilis All at risk adults Annually for men at increased risk      Health Maintenance List Health Maintenance  Topic Date Due  . COVID-19 Vaccine (1) Never done  . COLONOSCOPY (Pts 45-36yrs Insurance coverage will need to be confirmed)  05/23/2019  . TETANUS/TDAP  04/27/2026  . Hepatitis C Screening  Completed  . HIV Screening  Completed   . HPV VACCINES  Aged Out    Covid vaccine

## 2020-04-02 NOTE — Progress Notes (Signed)

## 2020-04-04 DIAGNOSIS — G4730 Sleep apnea, unspecified: Secondary | ICD-10-CM | POA: Diagnosis not present

## 2020-04-04 DIAGNOSIS — G4733 Obstructive sleep apnea (adult) (pediatric): Secondary | ICD-10-CM | POA: Diagnosis not present

## 2020-04-28 ENCOUNTER — Ambulatory Visit (INDEPENDENT_AMBULATORY_CARE_PROVIDER_SITE_OTHER): Payer: Medicare Other | Admitting: Student in an Organized Health Care Education/Training Program

## 2020-04-28 ENCOUNTER — Encounter: Payer: Self-pay | Admitting: Student in an Organized Health Care Education/Training Program

## 2020-04-28 VITALS — BP 149/67 | HR 78 | Temp 98.3°F | Ht 76.0 in | Wt >= 6400 oz

## 2020-04-28 DIAGNOSIS — Z125 Encounter for screening for malignant neoplasm of prostate: Secondary | ICD-10-CM

## 2020-04-28 DIAGNOSIS — M48061 Spinal stenosis, lumbar region without neurogenic claudication: Secondary | ICD-10-CM | POA: Diagnosis not present

## 2020-04-28 DIAGNOSIS — I10 Essential (primary) hypertension: Secondary | ICD-10-CM | POA: Diagnosis not present

## 2020-04-28 DIAGNOSIS — Z0000 Encounter for general adult medical examination without abnormal findings: Secondary | ICD-10-CM | POA: Diagnosis not present

## 2020-04-28 DIAGNOSIS — Z1211 Encounter for screening for malignant neoplasm of colon: Secondary | ICD-10-CM

## 2020-04-28 NOTE — Assessment & Plan Note (Signed)
About a 10 pound weight gain over the last year, weight is 400 pounds with a BMI of 48.  He has had decreased exercise due to transportation issues.  Nutrition is a big issue as well.  We set some reasonable goals today for improving exercise over the coming months.

## 2020-04-28 NOTE — Progress Notes (Signed)
   Assessment and Plan:  See Encounters tab for problem-based medical decision making.   __________________________________________________________  HPI:   60 year old person here for follow-up of hypertension and obesity.  Reports doing well over the last 1 year.  No hospitalizations, emergency department visits, or surgeries.  Lives at home alone, fairly isolated.  He lacks transportation and needs help from family members for many activities like grocery shopping.  He is disabled due to obesity, currently wearing about 400 pounds.  No longer able to mow his yard or do much activity outside the house, has been limited by osteoarthritis for many years.  Sleeps with the assistance of CPAP.  Reports good adherence with his medications and denies any recent adverse side effects.  No chest pain, stable exertional dyspnea, no orthopnea or PND.  __________________________________________________________  Problem List: Patient Active Problem List   Diagnosis Date Noted  . Morbid obesity ([place]) 11/22/2005    Priority: High  . Essential hypertension 11/22/2005    Priority: High    Class: Chronic  . Bilateral lower extremity edema 04/30/2019    Priority: Medium  . Cough variant asthma vs uacs  09/19/2017    Priority: Medium  . Allergic rhinitis 12/23/2010    Priority: Medium  . Obstructive sleep apnea 04/03/2007    Priority: Medium  . Asthma 11/22/2005    Priority: Medium  . Fatigue 08/15/2017    Priority: Low  . Encounter for preventive care 04/26/2016    Priority: Low  . Spinal stenosis in cervical region 11/19/2013    Priority: Low  . Left foot pain 02/13/2013    Priority: Low  . Headache 12/25/2012    Priority: Low  . Spinal stenosis in lumbar region 10/17/2008    Priority: Low  . GERD 06/23/2006    Priority: Low    Medications: Reconciled today in Epic __________________________________________________________  Physical Exam:  Vital Signs: Vitals:   04/28/20 0950   BP: (!) 149/67  Pulse: 78  Temp: 98.3 F (36.8 C)  TempSrc: Oral  SpO2: 97%  Weight: (!) 400 lb 12.8 oz (181.8 kg)  Height: 6\' 4"  (1.93 m)    Gen: Well appearing, NAD Neck: No cervical LAD, No thyromegaly or nodules, CV: RRR, no murmurs Pulm: Normal effort, CTA throughout, no wheezing Abd: Soft, NT, ND Ext: Warm, 1+ pitting edema bilaterally Skin:  No rashes

## 2020-04-28 NOTE — Assessment & Plan Note (Addendum)
Blood pressure modestly elevated today.  Home readings have been more appropriate.  Has had a high sodium diet recently.  Diastolic a little low at 67 and he is prone to orthostatic symptoms.  For these reasons I am to continue with his current regimen of valsartan 160 mg daily and carvedilol 12.5 mg daily.  Plan to check BMP today.  I am concerned about his use of valsartan with high doses of ibuprofen on a daily basis.  I talked to him about the risk of renal injury and have strongly recommended not using so much ibuprofen.

## 2020-04-28 NOTE — Assessment & Plan Note (Signed)
Patient with chronic pain and osteoarthritis in multiple joints related to his obesity.  He uses 600 mg of ibuprofen 3 or 4 times daily, every day.  We talked for a while today about the risk of using NSAIDs on a daily basis including gastric ulcers and renal insufficiency.  I advised him to stop using aspirin 81 mg daily, this was for primary prevention and likely only increasing his bleeding risk at this point.  He can continue using famotidine as well.  I hope that he is able to decrease his use of ibuprofen in the future.

## 2020-04-28 NOTE — Assessment & Plan Note (Signed)
Last colon cancer screening was 2009-04-10, has a brother who died of colon cancer so may be a little bit higher than average risk.  I have referred him back to GI for repeat colon cancer screening.  We talked about prostate cancer screening today, currently asymptomatic, interested in prostate cancer screening so will obtain PSA today.  He is up-to-date on his COVID-19 vaccination, I enter those into our system.

## 2020-04-29 ENCOUNTER — Encounter: Payer: Self-pay | Admitting: Student in an Organized Health Care Education/Training Program

## 2020-04-29 LAB — LIPID PANEL
Chol/HDL Ratio: 3.1 ratio (ref 0.0–5.0)
Cholesterol, Total: 138 mg/dL (ref 100–199)
HDL: 44 mg/dL (ref >39)
LDL Chol Calc (NIH): 69 mg/dL (ref 0–99)
Triglycerides: 141 mg/dL (ref 0–149)
VLDL Cholesterol Cal: 25 mg/dL (ref 5–40)

## 2020-04-29 LAB — BMP8+ANION GAP
Anion Gap: 20 mmol/L — ABNORMAL HIGH (ref 10.0–18.0)
BUN/Creatinine Ratio: 8 — ABNORMAL LOW (ref 10–24)
BUN: 8 mg/dL (ref 8–27)
CO2: 18 mmol/L — ABNORMAL LOW (ref 20–29)
Calcium: 9.5 mg/dL (ref 8.6–10.2)
Chloride: 102 mmol/L (ref 96–106)
Creatinine, Ser: 1.03 mg/dL (ref 0.76–1.27)
Glucose: 113 mg/dL — ABNORMAL HIGH (ref 65–99)
Potassium: 4.7 mmol/L (ref 3.5–5.2)
Sodium: 140 mmol/L (ref 134–144)
eGFR: 83 mL/min/{1.73_m2} (ref >59)

## 2020-04-29 LAB — PSA: Prostate Specific Ag, Serum: 0.4 ng/mL (ref 0.0–4.0)

## 2020-06-11 DIAGNOSIS — H25813 Combined forms of age-related cataract, bilateral: Secondary | ICD-10-CM | POA: Diagnosis not present

## 2020-06-11 DIAGNOSIS — H43813 Vitreous degeneration, bilateral: Secondary | ICD-10-CM | POA: Diagnosis not present

## 2020-06-20 DIAGNOSIS — H25812 Combined forms of age-related cataract, left eye: Secondary | ICD-10-CM | POA: Diagnosis not present

## 2020-07-15 ENCOUNTER — Encounter: Payer: Self-pay | Admitting: *Deleted

## 2020-07-17 DIAGNOSIS — G4733 Obstructive sleep apnea (adult) (pediatric): Secondary | ICD-10-CM | POA: Diagnosis not present

## 2020-07-17 DIAGNOSIS — G4730 Sleep apnea, unspecified: Secondary | ICD-10-CM | POA: Diagnosis not present

## 2020-09-03 DIAGNOSIS — H2511 Age-related nuclear cataract, right eye: Secondary | ICD-10-CM | POA: Diagnosis not present

## 2020-09-05 DIAGNOSIS — H25811 Combined forms of age-related cataract, right eye: Secondary | ICD-10-CM | POA: Diagnosis not present

## 2020-09-23 DIAGNOSIS — R140 Abdominal distension (gaseous): Secondary | ICD-10-CM | POA: Diagnosis not present

## 2020-09-23 DIAGNOSIS — R12 Heartburn: Secondary | ICD-10-CM | POA: Diagnosis not present

## 2020-10-02 ENCOUNTER — Ambulatory Visit (INDEPENDENT_AMBULATORY_CARE_PROVIDER_SITE_OTHER): Payer: Medicare Other | Admitting: Internal Medicine

## 2020-10-02 ENCOUNTER — Encounter: Payer: Self-pay | Admitting: Internal Medicine

## 2020-10-02 ENCOUNTER — Other Ambulatory Visit: Payer: Self-pay

## 2020-10-02 DIAGNOSIS — G4733 Obstructive sleep apnea (adult) (pediatric): Secondary | ICD-10-CM | POA: Diagnosis not present

## 2020-10-02 DIAGNOSIS — Z0000 Encounter for general adult medical examination without abnormal findings: Secondary | ICD-10-CM

## 2020-10-02 DIAGNOSIS — J45909 Unspecified asthma, uncomplicated: Secondary | ICD-10-CM | POA: Diagnosis not present

## 2020-10-02 MED ORDER — ALBUTEROL SULFATE HFA 108 (90 BASE) MCG/ACT IN AERS: 2.0000 | INHALATION_SPRAY | Freq: Four times a day (QID) | RESPIRATORY_TRACT | 3 refills | Status: DC | PRN

## 2020-10-02 NOTE — Progress Notes (Signed)
I discussed the AWV findings with the provider who conducted the visit. I was present in the office suite and immediately available to provide assistance and direction throughout the time the service was provided.  Albuterol refilled. Placed DME order for CPAP cleaning machine.  Mitzi Hansen, MD Internal Medicine Resident PGY-3 [facility] 10/02/2020 5:15 PM

## 2020-10-02 NOTE — Patient Instructions (Addendum)
All Notes   Progress Notes by Axel Filler, MD at 04/02/2020 11:01 AM  Author: Axel Filler, MD Author Type: Physician Filed: 04/02/2020 11:18 AM  Note Status: Signed Cosign: Cosign Not Required Encounter Date: 04/02/2020  Editor: Axel Filler, MD (Physician)             Things That May Be Affecting Your Health:   Alcohol   Hearing loss   Pain   x Depression   Home Safety   Sexual Health    Diabetes   Lack of physical activity   Stress    Difficulty with daily activities x Loneliness   Tiredness    Drug use   Medicines   Tobacco use    Falls   Motor Vehicle Safety   Weight    Food choices x Oral Health   Other      YOUR PERSONALIZED HEALTH PLAN : 1. Schedule your next subsequent Medicare Wellness visit in one year 2. Attend all of your regular appointments to address your medical issues 3. Complete the preventative screenings and services 4.  Please consider reaching out to Dr. Theodis Shove, [facility] (card enclosed). 5.  Please call your pharmacy and schedule an appointment for your 4th Covid vaccine.     Annual Wellness Visit                       Medicare Covered Preventative Screenings and Services   Services & Screenings Men and Women Who How Often Need? Date of Last Service Action  Abdominal Aortic Aneurysm Adults with AAA risk factors Once        Alcohol Misuse and Counseling All Adults Screening once a year if no alcohol misuse. Counseling up to 4 face to face sessions.        Bone Density Measurement  Adults at risk for osteoporosis Once every 2 yrs        Lipid Panel Z13.6 All adults without CV disease Once every 5 yrs            Colorectal Cancer  Stool sample or Colonoscopy All adults 85 and older   Once every year Every 10 years              Depression All Adults Once a year   Today    Diabetes Screening Blood glucose, post glucose load, or GTT Z13.1 All adults at risk Pre-diabetics Once per year Twice per year          Diabetes   Self-Management Training All adults Diabetics 10 hrs first year; 2 hours subsequent years. Requires Copay        Glaucoma Diabetics Family history of glaucoma African Americans 39 yrs + Hispanic Americans 55 yrs + Annually - requires coppay          Hepatitis C Z72.89 or F19.20 High Risk for HCV Born between 1945 and 1965 Annually Once          HIV Z11.4 All adults based on risk Annually btw ages 10 & 60 regardless of risk Annually > 65 yrs if at increased risk          Lung Cancer Screening Asymptomatic adults aged 67-77 with 30 pack yr history and current smoker OR quit within the last 15 yrs Annually Must have counseling and shared decision making documentation before first screen          Medical Nutrition Therapy Adults with  Diabetes Renal disease Kidney transplant within past 3 yrs  3 hours first year; 2 hours subsequent years        Obesity and Counseling All adults Screening once a year Counseling if BMI 30 or higher   Today    Tobacco Use Counseling Adults who use tobacco  Up to 8 visits in one year        Vaccines Z23 Hepatitis B Influenza  Pneumonia  Adults   Once Once every flu season Two different vaccines separated by one year        Next Annual Wellness Visit People with Medicare Every year   Today        [place] Women Who How Often Need  Date of Last Service Action  Mammogram  Z12.31 Women over 22 One baseline ages 88-39. Annually ager 40 yrs+          Pap tests All women Annually if high risk. Every 2 yrs for normal risk women          Screening for cervical cancer with  Pap (Z01.419 nl or Z01.411abnl) & HPV Z11.51 Women aged 29 to 23 Once every 5 yrs        Screening pelvic and breast exams All women Annually if high risk. Every 2 yrs for normal risk women        Sexually Transmitted Diseases Chlamydia Gonorrhea Syphilis All at risk adults Annually for non pregnant females at increased risk                [place] Men Who How Ofter Need  Date of Last Service Action  Prostate Cancer - DRE & PSA Men over 50 Annually.  DRE might require a copay.              Sexually Transmitted Diseases Syphilis All at risk adults Annually for men at increased risk          Health Maintenance List     Health Maintenance  Topic Date Due   COVID-19 Vaccine (1) Never done   COLONOSCOPY (Pts 45-45yrs Insurance coverage will need to be confirmed)  05/23/2019   TETANUS/TDAP  04/27/2026   Hepatitis C Screening  Completed   HIV Screening  Completed   HPV VACCINES  Aged Out             Fall Prevention in the Home, Adult Falls can cause injuries and can happen to people of all ages. There are many things you can do to make your home safe and to help prevent falls. Ask for help when making these changes. What actions can I take to prevent falls? General Instructions Use good lighting in all rooms. Replace any light bulbs that burn out. Turn on the lights in dark areas. Use night-lights. Keep items that you use often in easy-to-reach places. Lower the shelves around your home if needed. Set up your furniture so you have a clear path. Avoid moving your furniture around. Do not have throw rugs or other things on the floor that can make you trip. Avoid walking on wet floors. If any of your floors are uneven, fix them. Add color or contrast paint or tape to clearly mark and help you see: Grab bars or handrails. First and last steps of staircases. Where the edge of each step is. If you use a stepladder: Make sure that it is fully opened. Do not climb a closed stepladder. Make sure the sides of the stepladder are locked in place. Ask someone to hold the stepladder while you  use it. Know where your pets are when moving through your home. What can I do in the bathroom?   Keep the floor dry. Clean up any water on the floor right away. Remove soap buildup in the tub or shower. Use nonskid mats or decals  on the floor of the tub or shower. Attach bath mats securely with double-sided, nonslip rug tape. If you need to sit down in the shower, use a plastic, nonslip stool. Install grab bars by the toilet and in the tub and shower. Do not use towel bars as grab bars. What can I do in the bedroom? Make sure that you have a light by your bed that is easy to reach. Do not use any sheets or blankets for your bed that hang to the floor. Have a firm chair with side arms that you can use for support when you get dressed. What can I do in the kitchen? Clean up any spills right away. If you need to reach something above you, use a step stool with a grab bar. Keep electrical cords out of the way. Do not use floor polish or wax that makes floors slippery. What can I do with my stairs? Do not leave any items on the stairs. Make sure that you have a light switch at the top and the bottom of the stairs. Make sure that there are handrails on both sides of the stairs. Fix handrails that are broken or loose. Install nonslip stair treads on all your stairs. Avoid having throw rugs at the top or bottom of the stairs. Choose a carpet that does not hide the edge of the steps on the stairs. Check carpeting to make sure that it is firmly attached to the stairs. Fix carpet that is loose or worn. What can I do on the outside of my home? Use bright outdoor lighting. Fix the edges of walkways and driveways and fix any cracks. Remove anything that might make you trip as you walk through a door, such as a raised step or threshold. Trim any bushes or trees on paths to your home. Check to see if handrails are loose or broken and that both sides of all steps have handrails. Install guardrails along the edges of any raised decks and porches. Clear paths of anything that can make you trip, such as tools or rocks. Have leaves, snow, or ice cleared regularly. Use sand or salt on paths during winter. Clean up any spills in your  garage right away. This includes grease or oil spills. What other actions can I take? Wear shoes that: Have a low heel. Do not wear high heels. Have rubber bottoms. Feel good on your feet and fit well. Are closed at the toe. Do not wear open-toe sandals. Use tools that help you move around if needed. These include: Canes. Walkers. Scooters. Crutches. Review your medicines with your doctor. Some medicines can make you feel dizzy. This can increase your chance of falling. Ask your doctor what else you can do to help prevent falls. Where to find more information Centers for Disease Control and Prevention, [employer]AD[employer]: http://www.wolf.info/ National Institute on Aging: http://kim-miller.com/ Contact a doctor if: You are afraid of falling at home. You feel weak, drowsy, or dizzy at home. You fall at home. Summary There are many simple things that you can do to make your home safe and to help prevent falls. Ways to make your home safe include removing things that can make you trip and installing grab bars  in the bathroom. Ask for help when making these changes in your home. This information is not intended to replace advice given to you by your health care provider. Make sure you discuss any questions you have with your health care provider. Document Revised: 08/01/2019 Document Reviewed: 08/01/2019 Elsevier Patient Education  [place] Maintenance, Male Adopting a healthy lifestyle and getting preventive care are important in promoting health and wellness. Ask your health care provider about: The right schedule for you to have regular tests and exams. Things you can do on your own to prevent diseases and keep yourself healthy. What should I know about diet, weight, and exercise? Eat a healthy diet  Eat a diet that includes plenty of vegetables, fruits, low-fat dairy products, and lean protein. Do not eat a lot of foods that are high in solid fats, added sugars, or sodium. Maintain a  healthy weight Body mass index (BMI) is a measurement that can be used to identify possible weight problems. It estimates body fat based on height and weight. Your health care provider can help determine your BMI and help you achieve or maintain a healthy weight. Get regular exercise Get regular exercise. This is one of the most important things you can do for your health. Most adults should: Exercise for at least 150 minutes each week. The exercise should increase your heart rate and make you sweat (moderate-intensity exercise). Do strengthening exercises at least twice a week. This is in addition to the moderate-intensity exercise. Spend less time sitting. Even light physical activity can be beneficial. Watch cholesterol and blood lipids Have your blood tested for lipids and cholesterol at 62 years of age, then have this test every 5 years. You may need to have your cholesterol levels checked more often if: Your lipid or cholesterol levels are high. You are older than 62 years of age. You are at high risk for heart disease. What should I know about cancer screening? Many types of cancers can be detected early and may often be prevented. Depending on your health history and family history, you may need to have cancer screening at various ages. This may include screening for: Colorectal cancer. Prostate cancer. Skin cancer. Lung cancer. What should I know about heart disease, diabetes, and high blood pressure? Blood pressure and heart disease High blood pressure causes heart disease and increases the risk of stroke. This is more likely to develop in people who have high blood pressure readings, are of African descent, or are overweight. Talk with your health care provider about your target blood pressure readings. Have your blood pressure checked: Every 3-5 years if you are 47-19 years of age. Every year if you are 74 years old or older. If you are between the ages of 83 and 39 and are a  current or former smoker, ask your health care provider if you should have a one-time screening for abdominal aortic aneurysm (AAA). Diabetes Have regular diabetes screenings. This checks your fasting blood sugar level. Have the screening done: Once every three years after age 29 if you are at a normal weight and have a low risk for diabetes. More often and at a younger age if you are overweight or have a high risk for diabetes. What should I know about preventing infection? Hepatitis B If you have a higher risk for hepatitis B, you should be screened for this virus. Talk with your health care provider to find out if you are at risk for hepatitis B  infection. Hepatitis C Blood testing is recommended for: Everyone born from [birth year] through 1965. Anyone with known risk factors for hepatitis C. Sexually transmitted infections (STIs) You should be screened each year for STIs, including gonorrhea and chlamydia, if: You are sexually active and are younger than 62 years of age. You are older than 62 years of age and your health care provider tells you that you are at risk for this type of infection. Your sexual activity has changed since you were last screened, and you are at increased risk for chlamydia or gonorrhea. Ask your health care provider if you are at risk. Ask your health care provider about whether you are at high risk for HIV. Your health care provider may recommend a prescription medicine to help prevent HIV infection. If you choose to take medicine to prevent HIV, you should first get tested for HIV. You should then be tested every 3 months for as long as you are taking the medicine. Follow these instructions at home: Lifestyle Do not use any products that contain nicotine or tobacco, such as cigarettes, e-cigarettes, and chewing tobacco. If you need help quitting, ask your health care provider. Do not use street drugs. Do not share needles. Ask your health care provider for help if you  need support or information about quitting drugs. Alcohol use Do not drink alcohol if your health care provider tells you not to drink. If you drink alcohol: Limit how much you have to 0-2 drinks a day. Be aware of how much alcohol is in your drink. In the [place] one drink equals one 12 oz bottle of beer (355 mL), one 5 oz glass of wine (148 mL), or one 1 oz glass of hard liquor (44 mL). General instructions Schedule regular health, dental, and eye exams. Stay current with your vaccines. Tell your health care provider if: You often feel depressed. You have ever been abused or do not feel safe at home. Summary Adopting a healthy lifestyle and getting preventive care are important in promoting health and wellness. Follow your health care provider's instructions about healthy diet, exercising, and getting tested or screened for diseases. Follow your health care provider's instructions on monitoring your cholesterol and blood pressure. This information is not intended to replace advice given to you by your health care provider. Make sure you discuss any questions you have with your health care provider. Document Revised: 03/07/2020 Document Reviewed: 12/21/2017 Elsevier Patient Education  2022 [employer].

## 2020-10-02 NOTE — Progress Notes (Signed)
This AWV is being conducted by [place] only. The patient was located at home and I was located in [facility]. The patient's identity was confirmed using their DOB and current address. The patient or his/her legal guardian has consented to being evaluated through a telephone encounter and understands the associated risks (an examination cannot be done and the patient may need to come in for an appointment) / benefits (allows the patient to remain at home, decreasing exposure to coronavirus). I personally spent 60 minutes conducting the AWV.  Subjective:   Eduardo Duncan is a 60 y.o. male who presents for a Medicare Annual Wellness Visit.  The following items have been reviewed and updated today in the appropriate area in the EMR.   Health Risk Assessment  Height, weight, BMI, and BP Visual acuity if needed Depression screen Fall risk / safety level Advance directive discussion Medical and family history were reviewed and updated Updating list of other providers & suppliers Medication reconciliation, including over the counter medicines Cognitive screen Written screening schedule Risk Factor list Personalized health advice, risky behaviors, and treatment advice  Social History   Social History Narrative   Current Social History 10/02/2020        Patient lives alone in a/an home / condo / townhome which is 1 story/stories. There are not steps up to the entrance the patient uses.       Patient's method of transportation is via family member, but plans to start driving soon      The highest level of education was high school diploma.      The patient currently disabled.      Identified important Relationships are Family       Pets : No       Interests / Fun: "[facility], I would like to get back there soon"       Current Stressors: None          Objective:    Vitals: There were no vitals taken for this visit. Vitals are unable to  obtained due to QQPYP-95 public health emergency  Activities of Daily Living In your present state of health, do you have any difficulty performing the following activities: 10/02/2020 04/28/2020  Hearing? N N  Vision? N Y  Comment recently had cataract surgery -  Difficulty concentrating or making decisions? N N  Walking or climbing stairs? Y N  Dressing or bathing? Y N  Doing errands, shopping? Y N  Some recent data might be hidden    Goals  Goals       Blood Pressure < 140/90      Patient Stated (pt-stated)      "Would [facility]"        Fall Risk Fall Risk  10/02/2020 04/28/2020 04/30/2019 09/19/2017 08/15/2017  Falls in the past year? 0 1 1 No No  Number falls in past yr: - 1 0 - -  Injury with Fall? - 1 0 - -  Risk Factor Category  - - - - -  Risk for fall due to : - - - - -  Risk for fall due to: Comment - - - - -  Follow up Falls evaluation completed - - - -    Depression Screen PHQ 2/9 Scores 10/02/2020 04/28/2020 04/30/2019 09/19/2017  PHQ - 2 Score 3 0 2 2  PHQ- 9 Score 9 - 6 9  Cognitive Testing Six-Item Cognitive Screener   "I would like to ask you some questions that ask you to use your memory. I am going to name three objects. Please wait until I say all three words, then repeat them. Remember what they are  because I am going to ask you to name them again in a few minutes. Please repeat these words for me: APPLE--TABLE--PENNY." (Interviewer may repeat names 3 times if necessary but repetition not scored.)  Did patient correctly repeat all three words? Yes - may proceed with screen  What year is this? Correct What month is this? Correct What day of the week is this? Correct  What were the three objects I asked you to remember? Apple Correct Table Correct Penny Correct  Score one point for each incorrect answer.  A score of 2 or more points warrants additional investigation.  Patient's score 0     Assessment and Plan:    The patient has made a goal to increase his activity level, he states he would like to start going back to the gym this fall and is making an appointment with his cardiologist to discuss an exercise routine.  The patient scored 9 on his PHQ9, RN offered an appointment with Dr. Theodis Shove which he declined.    He will call his pharmacy and schedule an appointment to obtain his 4th covid vaccine.   During the course of the visit the patient was educated and counseled about appropriate screening and preventive services as documented in the assessment and plan.  The printed AVS was given to the patient and included an updated screening schedule, a list of risk factors, and personalized health advice.        Higinio Roger, RN  10/02/2020

## 2020-10-03 NOTE — Progress Notes (Signed)
Internal Medicine Clinic Attending  Case discussed with Dr. Darrick Meigs.  I reviewed the AWV findings.  I agree with the assessment, diagnosis, and plan of care documented in the AWV note.

## 2020-10-16 DIAGNOSIS — H5213 Myopia, bilateral: Secondary | ICD-10-CM | POA: Diagnosis not present

## 2020-10-16 DIAGNOSIS — Z800 Family history of malignant neoplasm of digestive organs: Secondary | ICD-10-CM | POA: Diagnosis not present

## 2020-10-21 DIAGNOSIS — G4733 Obstructive sleep apnea (adult) (pediatric): Secondary | ICD-10-CM | POA: Diagnosis not present

## 2020-10-21 DIAGNOSIS — G4730 Sleep apnea, unspecified: Secondary | ICD-10-CM | POA: Diagnosis not present

## 2020-10-26 ENCOUNTER — Other Ambulatory Visit: Payer: Self-pay | Admitting: Student in an Organized Health Care Education/Training Program

## 2020-10-26 DIAGNOSIS — I10 Essential (primary) hypertension: Secondary | ICD-10-CM

## 2020-10-26 DIAGNOSIS — E785 Hyperlipidemia, unspecified: Secondary | ICD-10-CM

## 2021-01-21 DIAGNOSIS — G4730 Sleep apnea, unspecified: Secondary | ICD-10-CM | POA: Diagnosis not present

## 2021-01-21 DIAGNOSIS — G4733 Obstructive sleep apnea (adult) (pediatric): Secondary | ICD-10-CM | POA: Diagnosis not present

## 2021-01-22 ENCOUNTER — Other Ambulatory Visit: Payer: Self-pay | Admitting: Student in an Organized Health Care Education/Training Program

## 2021-04-14 DIAGNOSIS — H524 Presbyopia: Secondary | ICD-10-CM | POA: Diagnosis not present

## 2021-04-14 DIAGNOSIS — Z961 Presence of intraocular lens: Secondary | ICD-10-CM | POA: Diagnosis not present

## 2021-04-14 DIAGNOSIS — H43813 Vitreous degeneration, bilateral: Secondary | ICD-10-CM | POA: Diagnosis not present

## 2021-05-06 DIAGNOSIS — G4733 Obstructive sleep apnea (adult) (pediatric): Secondary | ICD-10-CM | POA: Diagnosis not present

## 2021-05-06 DIAGNOSIS — G4730 Sleep apnea, unspecified: Secondary | ICD-10-CM | POA: Diagnosis not present

## 2021-06-15 ENCOUNTER — Encounter: Payer: Self-pay | Admitting: Student in an Organized Health Care Education/Training Program

## 2021-06-15 ENCOUNTER — Ambulatory Visit (INDEPENDENT_AMBULATORY_CARE_PROVIDER_SITE_OTHER): Payer: Medicare Other | Admitting: Student in an Organized Health Care Education/Training Program

## 2021-06-15 DIAGNOSIS — G4733 Obstructive sleep apnea (adult) (pediatric): Secondary | ICD-10-CM | POA: Diagnosis not present

## 2021-06-15 DIAGNOSIS — Z6841 Body Mass Index (BMI) 40.0 and over, adult: Secondary | ICD-10-CM

## 2021-06-15 DIAGNOSIS — Z87891 Personal history of nicotine dependence: Secondary | ICD-10-CM | POA: Diagnosis not present

## 2021-06-15 DIAGNOSIS — J45909 Unspecified asthma, uncomplicated: Secondary | ICD-10-CM

## 2021-06-15 DIAGNOSIS — I10 Essential (primary) hypertension: Secondary | ICD-10-CM | POA: Diagnosis not present

## 2021-06-15 LAB — POCT GLYCOSYLATED HEMOGLOBIN (HGB A1C): Hemoglobin A1C: 5.2 % (ref 4.0–5.6)

## 2021-06-15 LAB — GLUCOSE, CAPILLARY: Glucose-Capillary: 97 mg/dL (ref 70–99)

## 2021-06-15 NOTE — Assessment & Plan Note (Signed)
Stable issue, comanaged with pulmonology. He is currently using albuterol rescue a little more than I would like, but he has had bad reactions in the past to daily inhalers like an ICS. Will continue with albuterol alone and good sinus hygiene to help with congestion.

## 2021-06-15 NOTE — Assessment & Plan Note (Signed)
Blood pressure a little above goal here in the office, but he reports daily home readings are within our goal. He has a lot of sensitivities to medicines, so will continue with current valsartan '160mg'$  daily which he is tolerating well. Great to see him losing weight. Will check BMP today.

## 2021-06-15 NOTE — Assessment & Plan Note (Signed)
Patient reports 20 lb weight loss over the last six months. He had changes in his diet, he receives more healthy food deliveries through his insurance plan which is great to see. We talked about increasing exercise today to capitalize on these improvements. Not a good candidate for GLP1 agonist at this time. Will check A1c to screen for diabetes.

## 2021-06-15 NOTE — Patient Instructions (Signed)
It was great seeing you today. I will call you with the results of your blood work. We talked about your hypertension, asthma, and obesity today. Please continue your medications as we discussed.

## 2021-06-15 NOTE — Assessment & Plan Note (Signed)
On CPAP since 2009, doing ok with it, some issues with tolerance. He reports the machine is working well now. Will continue with current CPAP set up and I encouraged good adherence for optimal results.

## 2021-06-15 NOTE — Progress Notes (Signed)
   Established Patient Office Visit  Subjective   Patient ID: Eduardo Duncan, male    DOB: 11-Apr-1960  Age: 61 y.o. MRN: [id number]   HPI  61 year old person here for follow up of hypertension. He has done well this last year since I last saw him. No hospitalizations, no acute illnesses, no changes in his medications. He checks his BP regularly at home and reports readings around 115-135/60-80. Says that some diastolic readings as low as 55, no symptoms of presyncope or orthostasis. No falls in the last year at home, he walks with a cane to help with balance. He has lost weight recently, he changed his diet. He receives a stipend for delivery foods from his insurance, which has led him to eat more canned vegetables, has been a positive change for him. No recent fevers or chills, no chest pain or dyspnea with exertion. He sleeps with a CPAP machine most nights, sometimes sleeps in his chair. He has family in [place], he is independent in all his activities of daily living, often has limited transportation.     Objective:     BP (!) 168/100 (BP Location: Right Arm, Patient Position: Sitting, Cuff Size: Normal)   Pulse 80   Temp 98.2 F (36.8 C) (Oral)   Ht '6\' 4"'$  (1.93 m)   Wt (!) 371 lb 8 oz (168.5 kg)   SpO2 97%   BMI 45.22 kg/m   Gen: well appearing man, no distress Neck: thick, normal thyroid, no adenopathy CV: distant sounds, no murmurs Lungs: clear throughout, no wheezing Ext: trace bilateral pitting edema with mild stasis changes of the skin Psych: appropriate, not depressed or anxious appearing    Assessment & Plan:   Problem List Items Addressed This Visit       High   Morbid obesity ([place]) - Primary (Chronic)    Patient reports 20 lb weight loss over the last six months. He had changes in his diet, he receives more healthy food deliveries through his insurance plan which is great to see. We talked about increasing exercise today to capitalize on these improvements.  Not a good candidate for GLP1 agonist at this time. Will check A1c to screen for diabetes.       Relevant Orders   POC Hbg A1C   Lipid Profile   Essential hypertension (Chronic)    Blood pressure a little above goal here in the office, but he reports daily home readings are within our goal. He has a lot of sensitivities to medicines, so will continue with current valsartan '160mg'$  daily which he is tolerating well. Great to see him losing weight. Will check BMP today.       Relevant Orders   BMP8+Anion Gap     Medium    Obstructive sleep apnea (Chronic)    On CPAP since 2009, doing ok with it, some issues with tolerance. He reports the machine is working well now. Will continue with current CPAP set up and I encouraged good adherence for optimal results.       Asthma (Chronic)    Stable issue, comanaged with pulmonology. He is currently using albuterol rescue a little more than I would like, but he has had bad reactions in the past to daily inhalers like an ICS. Will continue with albuterol alone and good sinus hygiene to help with congestion.        Return in about 6 months (around 12/15/2021).    Axel Filler, MD

## 2021-06-16 LAB — LIPID PANEL
Chol/HDL Ratio: 4.1 ratio (ref 0.0–5.0)
Cholesterol, Total: 115 mg/dL (ref 100–199)
HDL: 28 mg/dL — ABNORMAL LOW (ref >39)
LDL Chol Calc (NIH): 63 mg/dL (ref 0–99)
Triglycerides: 136 mg/dL (ref 0–149)
VLDL Cholesterol Cal: 24 mg/dL (ref 5–40)

## 2021-06-16 LAB — BMP8+ANION GAP
Anion Gap: 18 mmol/L (ref 10.0–18.0)
BUN/Creatinine Ratio: 10 (ref 10–24)
BUN: 10 mg/dL (ref 8–27)
CO2: 20 mmol/L (ref 20–29)
Calcium: 9.3 mg/dL (ref 8.6–10.2)
Chloride: 103 mmol/L (ref 96–106)
Creatinine, Ser: 1.04 mg/dL (ref 0.76–1.27)
Glucose: 91 mg/dL (ref 70–99)
Potassium: 3.7 mmol/L (ref 3.5–5.2)
Sodium: 141 mmol/L (ref 134–144)
eGFR: 81 mL/min/{1.73_m2} (ref >59)

## 2021-08-04 DIAGNOSIS — G4730 Sleep apnea, unspecified: Secondary | ICD-10-CM | POA: Diagnosis not present

## 2021-08-04 DIAGNOSIS — G4733 Obstructive sleep apnea (adult) (pediatric): Secondary | ICD-10-CM | POA: Diagnosis not present

## 2021-10-19 ENCOUNTER — Other Ambulatory Visit: Payer: Self-pay | Admitting: Student in an Organized Health Care Education/Training Program

## 2021-10-19 DIAGNOSIS — I10 Essential (primary) hypertension: Secondary | ICD-10-CM

## 2021-10-19 DIAGNOSIS — E785 Hyperlipidemia, unspecified: Secondary | ICD-10-CM

## 2021-11-08 DIAGNOSIS — G4733 Obstructive sleep apnea (adult) (pediatric): Secondary | ICD-10-CM | POA: Diagnosis not present

## 2021-11-08 DIAGNOSIS — G4730 Sleep apnea, unspecified: Secondary | ICD-10-CM | POA: Diagnosis not present

## 2021-12-21 ENCOUNTER — Encounter: Payer: Self-pay | Admitting: Student in an Organized Health Care Education/Training Program

## 2021-12-21 ENCOUNTER — Ambulatory Visit (INDEPENDENT_AMBULATORY_CARE_PROVIDER_SITE_OTHER): Payer: Medicare Other | Admitting: Student in an Organized Health Care Education/Training Program

## 2021-12-21 VITALS — BP 158/80 | HR 77 | Temp 98.1°F | Ht 76.0 in | Wt 399.1 lb

## 2021-12-21 DIAGNOSIS — J45909 Unspecified asthma, uncomplicated: Secondary | ICD-10-CM | POA: Diagnosis not present

## 2021-12-21 DIAGNOSIS — Z0000 Encounter for general adult medical examination without abnormal findings: Secondary | ICD-10-CM

## 2021-12-21 DIAGNOSIS — Z87891 Personal history of nicotine dependence: Secondary | ICD-10-CM

## 2021-12-21 DIAGNOSIS — Z1211 Encounter for screening for malignant neoplasm of colon: Secondary | ICD-10-CM

## 2021-12-21 DIAGNOSIS — I10 Essential (primary) hypertension: Secondary | ICD-10-CM | POA: Diagnosis not present

## 2021-12-21 NOTE — Assessment & Plan Note (Signed)
-  FIT kit provided for colon cancer screening today

## 2021-12-21 NOTE — Progress Notes (Signed)
Attestation for Student Documentation:  I personally was present and performed or re-performed the history, physical exam and medical decision-making activities of this service and have verified that the service and findings are accurately documented in the student's note.  61 year old person living with severe obesity here for follow up of hypertension. Weight continues to fluctuate between 370lbs and 400lbs. No signs of diabetes on previous A1c. His hypertension is high in the office, but home readings are in the controlled range. He has sensitivities to medication adjustments, so will continue with valsartan and carvedilol for now. His asthma has intermittent symptoms of wheezing and sputum production, which improves with albuterol. He may have enviornmental sensitivities, feels like he does to mold in his house. I will refer him back to allergy who he hasn't seen since 2019, continue with cetirizine, famotidine, and prn albuterol for now. We have tried ICS and LABA in the past but struggled with tolerance.  Eduardo Filler, MD 12/21/2021, 1:22 PM

## 2021-12-21 NOTE — Patient Instructions (Addendum)
Eduardo Duncan,  It was great to see you in clinic today.  Below is what we discussed today:  For your asthma - We will refer you to a new allergist/immunologist  For your weight loss goals - the total fitness machine is a nice idea. Try to ease into it and not overexert past your limits We have provided you with FIT testing today  No changes to your medications today.   It is a pleasure to be a part of your team, thank you for allowing [place] to be a part of your care,  Max and Dr. Evette Doffing  If you need medication refills please notify your pharmacy one week in advance and they will send [place] a request.

## 2021-12-21 NOTE — Progress Notes (Signed)
Subjective:   Patient ID: Eduardo Duncan male   DOB: 03/18/1960 61 y.o.   MRN: [id number]  HPI: [name] is a 61 y.o. man with medical history below who presents for follow-up of hypertension.   Patient reports good adherence to his BP medications with no issues. He has wrist BP cuff at home and monitors his BP regularly. He reports his reading on his home cuff this morning was in the 120s/70s. He has not had chest pain or chest pressure on exertion and he has not had episodes of difficulty breathing on exertion. He denies headache and dizziness.  He discusses that he is discouraged from his weight. He reports that his chronic spine issues unfortunately limit his ability achieve his exercise goals [place]. In the past, he was able to achieve weight loss with aquatics and physical therapy that he hopes to continue in the future. He is also considering getting a total fitness machine for working out at home.  He reports feeling congested and coughing up green sputum every few days and uses his albuterol inhaler during these episodes which helps. He denies wheezing, shortness of breath, and difficulty breathing.  Please see problem-based assessment and plan charting for further details.  Review of Systems: Pertinent items are noted in HPI.  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[place])    Spinal stenosis of lumbar region     Patient Active Problem List   Diagnosis Date Noted   Bilateral lower extremity edema 04/30/2019   Cough variant asthma vs uacs  09/19/2017   Encounter for preventive care 04/26/2016   Spinal stenosis in cervical region 11/19/2013   Allergic rhinitis 12/23/2010   Spinal stenosis in lumbar region 10/17/2008   Obstructive  sleep apnea 04/03/2007   GERD 06/23/2006   Morbid obesity ([place]) 11/22/2005   Essential hypertension 11/22/2005    Class: Chronic   Asthma 11/22/2005     Current Outpatient Medications  Medication Sig Dispense Refill   acetaminophen (TYLENOL) 500 MG tablet Take 500 mg by mouth every 6 (six) hours as needed.     albuterol (PROAIR HFA) 108 (90 Base) MCG/ACT inhaler Inhale 2 puffs into the lungs every 6 (six) hours as needed for wheezing or shortness of breath. 18 g 3   aspirin EC 81 MG tablet Take 81 mg by mouth daily. Swallow whole.     carvedilol (COREG) 12.5 MG tablet TAKE 1 TABLET BY MOUTH TWICE DAILY WITH MEALS 180 tablet 3   cetirizine (ZYRTEC) 10 MG tablet Take 10 mg by mouth daily.     famotidine (PEPCID) 20 MG tablet Take 1/2 (one-half) tablet by mouth twice daily 90 tablet 0   fluticasone (FLONASE) 50 MCG/ACT nasal spray Place 1 spray into both nostrils daily.     ibuprofen (ADVIL,MOTRIN) 200 MG tablet Take 600 mg by mouth 3 (three) times daily as needed.      simvastatin (ZOCOR) 40 MG tablet TAKE 1 TABLET BY MOUTH AT BEDTIME 90 tablet 3   valsartan (DIOVAN) 160 MG tablet Take 1 tablet by mouth once daily 90 tablet 3   No current facility-administered medications for this visit.     Objective:  Physical Exam: Vitals:   12/21/21 1037  BP: (!) 182/88  Pulse: 80  Temp: 98.1 F (36.7 C)  TempSrc: Oral  SpO2: 96%  Weight: (!) 399 lb 1.6 oz (181 kg)  Height: _0  (1.93 m)   Constitutional: well appearing, in no acute distress HENT: mucous membranes moist Cardiovascular: regular rate with normal rhythm, no murmurs Pulmonary/Chest: normal work of breathing on room air, lungs clear to auscultation bilaterally, no wheezing Abdominal: soft, non-tender, non-distended, bowel sounds present MSK: mild pitting edema bilaterally Skin: warm and dry Neurological: alert and answering questions appropriately Psych: appropriate mood and affect   Assessment & Plan:   Essential  hypertension Blood pressure above goal in clinic today at 158/90. Patient reports his home BP reading this morning was in the 120s/70s and reports that his readings are typically around 120s/70s and do not usually read above 150. He denies chest pain or chest pressure on exertion, and he has not had episodes of difficulty breathing on exertion. He denies headache and dizziness.  Plan -Continue valsartan 160 mg and carvedilol 12.5 mg daily -Repeat BMP at next f/u in 6 months  Morbid obesity ([place]) Patient has gained about 28 pounds from his previous visit in June. He reports that his chronic spine issues unfortunately limit his ability achieve his exercise goals. In the past, he was able to achieve weight loss with aquatics and physical therapy that he hopes to continue in the future. He is also considering getting a total fitness machine for working out at home. [place] On review, his weight appears fluctuate in the 370-400 lb range over the past couple of years.  Plan -Encouraged patient to exercise when able. Advised the total fitness machine is a reasonable idea for exercise at home and to ease into it and not overexert  Asthma Patient reports feeling congested and coughing up green sputum every few days and uses his albuterol inhaler during these episodes which helps. He denies wheezing, shortness of breath, and difficulty breathing. Overall, appears stable with albuterol as needed.   Plan -Continue albuterol every 6 hours as needed -Referral to allergy and immunology placed as he no longer sees his previous provider for this  Healthcare maintenance -FIT kit provided for colon cancer screening today    Eduardo Duncan, MS3 12/21/2021, 12:57 PM

## 2021-12-21 NOTE — Assessment & Plan Note (Signed)
Patient reports feeling congested and coughing up green sputum every few days and uses his albuterol inhaler during these episodes which helps. He denies wheezing, shortness of breath, and difficulty breathing. Overall, appears stable with albuterol as needed.   Plan -Continue albuterol every 6 hours as needed -Referral to allergy and immunology placed as he no longer sees his previous provider for this

## 2021-12-21 NOTE — Assessment & Plan Note (Signed)
Patient has gained about 28 pounds from his previous visit in June. He reports that his chronic spine issues unfortunately limit his ability achieve his exercise goals. In the past, he was able to achieve weight loss with aquatics and physical therapy that he hopes to continue in the future. He is also considering getting a total fitness machine for working out at home. [place] On review, his weight appears fluctuate in the 370-400 lb range over the past couple of years.  Plan -Encouraged patient to exercise when able. Advised the total fitness machine is a reasonable idea for exercise at home and to ease into it and not overexert

## 2021-12-21 NOTE — Assessment & Plan Note (Signed)
Blood pressure above goal in clinic today at 158/90. Patient reports his home BP reading this morning was in the 120s/70s and reports that his readings are typically around 120s/70s and do not usually read above 150. He denies chest pain or chest pressure on exertion, and he has not had episodes of difficulty breathing on exertion. He denies headache and dizziness.  Plan -Continue valsartan 160 mg and carvedilol 12.5 mg daily -Repeat BMP at next f/u in 6 months

## 2022-01-13 ENCOUNTER — Other Ambulatory Visit: Payer: Self-pay | Admitting: Student in an Organized Health Care Education/Training Program

## 2022-01-13 ENCOUNTER — Other Ambulatory Visit: Payer: Self-pay | Admitting: Internal Medicine

## 2022-01-13 DIAGNOSIS — J45909 Unspecified asthma, uncomplicated: Secondary | ICD-10-CM

## 2022-02-11 ENCOUNTER — Encounter: Payer: Self-pay | Admitting: Allergy

## 2022-02-11 ENCOUNTER — Ambulatory Visit (INDEPENDENT_AMBULATORY_CARE_PROVIDER_SITE_OTHER): Payer: 59 | Admitting: Allergy

## 2022-02-11 ENCOUNTER — Other Ambulatory Visit: Payer: Self-pay

## 2022-02-11 VITALS — BP 130/80 | HR 70 | Temp 97.8°F | Resp 17 | Ht 74.5 in | Wt 394.2 lb

## 2022-02-11 DIAGNOSIS — J45998 Other asthma: Secondary | ICD-10-CM | POA: Diagnosis not present

## 2022-02-11 DIAGNOSIS — G4730 Sleep apnea, unspecified: Secondary | ICD-10-CM | POA: Diagnosis not present

## 2022-02-11 DIAGNOSIS — J310 Chronic rhinitis: Secondary | ICD-10-CM

## 2022-02-11 DIAGNOSIS — J4540 Moderate persistent asthma, uncomplicated: Secondary | ICD-10-CM | POA: Diagnosis not present

## 2022-02-11 DIAGNOSIS — J683 Other acute and subacute respiratory conditions due to chemicals, gases, fumes and vapors: Secondary | ICD-10-CM

## 2022-02-11 DIAGNOSIS — H1013 Acute atopic conjunctivitis, bilateral: Secondary | ICD-10-CM

## 2022-02-11 DIAGNOSIS — H109 Unspecified conjunctivitis: Secondary | ICD-10-CM

## 2022-02-11 DIAGNOSIS — G4733 Obstructive sleep apnea (adult) (pediatric): Secondary | ICD-10-CM | POA: Diagnosis not present

## 2022-02-11 MED ORDER — LEVALBUTEROL TARTRATE 45 MCG/ACT IN AERO: 2.0000 | INHALATION_SPRAY | RESPIRATORY_TRACT | 1 refills | Status: DC | PRN

## 2022-02-11 MED ORDER — SYMBICORT 160-4.5 MCG/ACT IN AERO: 2.0000 | INHALATION_SPRAY | Freq: Two times a day (BID) | RESPIRATORY_TRACT | 5 refills | Status: DC

## 2022-02-11 MED ORDER — LEVALBUTEROL HCL 0.63 MG/3ML IN NEBU: 0.6300 mg | INHALATION_SOLUTION | Freq: Four times a day (QID) | RESPIRATORY_TRACT | 2 refills | Status: DC | PRN

## 2022-02-11 NOTE — Progress Notes (Signed)
New Patient Note  RE: Eduardo Duncan MRN: [id number] DOB: November 25, 1960 Date of Office Visit: 02/11/2022  Primary care provider: Axel Filler, MD  Chief Complaint: asthma and allergies  History of present illness: Eduardo Duncan is a 62 y.o. male presenting today for evaluation of asthma and allergies.   He has history of asthma.  He states in the 90s he was diagnosed with occupational asthma.  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 Later on he states he was just diagnosed with regular type asthma.  He has been having issues recently with his asthma.  He reports symptoms include wheezing, shortness of breath, fatigue, lightheadedness.  He states with albuterol use it makes his heart race and in the past was told he that albuterol was interfering with carvedilol and it should be change.  He has never had xopenex however.   He states he tries not to use albuterol due to the tachycardia but states he has asthma symptoms 4-5 days a week.  He currently does not have any maintenance asthma medications either.  He states that in the past he was on a variety of different maintenance medications but he was also on other medications like Valium and other pain medicines for his chronic spinal pain issues.  Flovent (first inhaler he used in the 90s), advair, singulair, spiriva and another LAMA he states he used but does not recall his last 1.  The medications he was all on around this time in the past he states was causing him to have mental health crises.  He states all of his medications were stopped it completely that he has been not been on any maintenance medications since.  He also reports that prednisone also causes issues with his mental health.  He does use CPAP and states since being on CPAP he has gained a significant amount of weight.  He wakes in the  morning feeling like his is drowning.  He currently is not seeing any one of them sleep medicine however he has seen Dr. Ander Slade in the past.  He does report symptoms of itchy eyes, runny nose, sneezing, congestion.  He takes zyrtec and flonase daily for years.    He states his basement floods when it rains and states it gets in his ductwork.  Thus there is concern that this could be causing some of his asthma and allergy symptoms.  He states as a kid he was told he was lactose intolerant and avoided straight milk for a while but in high school states he was able to drink whole milk.  As he is not sure if he was told the wrong thing when he was a kid or not.  He see saw Dr Luberta Robertson in allergy at [facility] in 2018.  He did have environmental allergy testing by blood work that was negative.  Review of systems: Review of Systems  Constitutional: Negative.   HENT: Negative.         See HPI  Eyes: Negative.   Respiratory:         See HPI  Cardiovascular: Negative.   Musculoskeletal: Negative.   Skin: Negative.   Allergic/Immunologic: Negative.   Neurological: Negative.     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[place])    Spinal stenosis of lumbar region     Past surgical history: Past Surgical History:  Procedure Laterality Date   CHOLECYSTECTOMY  07/2009   Lap   CHOLECYSTECTOMY  07/11/2009   COLONOSCOPY  01/2009    Family history:  Family History  Problem Relation Age of Onset   Diabetes Mother    Hypertension Mother    Alzheimer's disease Mother    Heart disease Mother    Heart disease Father    Heart disease Brother    Colon cancer Brother 48   Stroke Brother    Cancer Maternal Grandmother    Ovarian cancer Paternal Grandmother      Social history: Lives in a home without carpeting with heat pump heating and cooling.  No pets in the home.  There is concern for water damage or mildew in the home.  No concern for roaches in the home.  He denies a smoking history at this time. Occupational History   Occupation: disability     Employer: DISABLED    Comment: 10/2006--has not worked since 2004  Tobacco Use   Smoking status: Former    Packs/day: 1.00    Years: 12.00    Total pack years: 12.00    Types: Cigarettes    Quit date: 03/13/1986    Years since quitting: 35.9    Passive exposure: Past   Smokeless tobacco: Never  Vaping Use   Vaping Use: Never used   Medication List: Current Outpatient Medications  Medication Sig Dispense Refill   acetaminophen (TYLENOL) 500 MG tablet Take 500 mg by mouth every 6 (six) hours as needed.     aspirin EC 81 MG tablet Take 81 mg by mouth daily. Swallow whole.     carvedilol (COREG) 12.5 MG tablet TAKE 1 TABLET BY MOUTH TWICE DAILY WITH MEALS 180 tablet 3   cetirizine (ZYRTEC) 10 MG tablet Take 10 mg by mouth daily.     famotidine (PEPCID) 20 MG tablet Take 1/2 (one-half) tablet by mouth twice daily 90 tablet 0   fluticasone (FLONASE) 50 MCG/ACT nasal spray Place 1 spray into both nostrils daily.     ibuprofen (ADVIL,MOTRIN) 200 MG tablet Take 600 mg by mouth 3 (three) times daily as needed.      levalbuterol (XOPENEX HFA) 45 MCG/ACT inhaler Inhale 2 puffs into the lungs every 4 (four) hours as needed for wheezing. 15 g 1   levalbuterol (XOPENEX) 0.63 MG/3ML nebulizer solution Take 3 mLs (0.63 mg total) by nebulization 4 (four) times daily as needed for wheezing or shortness of breath. 3 mL 2   simvastatin (ZOCOR) 40 MG tablet TAKE 1 TABLET BY MOUTH AT BEDTIME 90 tablet 3   SYMBICORT 160-4.5 MCG/ACT inhaler Inhale 2 puffs into the lungs in the morning and at bedtime. 10.2 g 5   valsartan (DIOVAN) 160 MG tablet Take 1 tablet by mouth once daily 90 tablet 3   No current  facility-administered medications for this visit.    Known medication allergies: Allergies  Allergen Reactions   Ace Inhibitors Cough   Advair Diskus [Fluticasone-Salmeterol] Other (See Comments)    Confusion and paranoid   Codeine Nausea Only    REACTION: nausea   Gabapentin Other (See Comments)    Facial and left shoulder numbness   Montelukast  Sodium Other (See Comments)    Confusion and paranoid   Prednisone Other (See Comments)    Confusion and paranoid   Prednisolone Anxiety     Physical examination: Blood pressure 130/80, pulse 70, temperature 97.8 F (36.6 C), temperature source Temporal, resp. rate 17, height 6' 2.5" (1.892 m), weight (!) 394 lb 3.2 oz (178.8 kg), SpO2 96 %.  General: Alert, interactive, in no acute distress. HEENT: PERRLA, TMs pearly gray, turbinates mildly edematous without discharge, post-pharynx non erythematous. Neck: Supple without lymphadenopathy. Lungs: Mildly decreased breath sounds bilaterally without wheezing, rhonchi or rales. {no increased work of breathing. CV: Normal S1, S2 without murmurs. Abdomen: Nondistended, nontender. Skin: Warm and dry, without lesions or rashes. Extremities:  No clubbing, cyanosis or edema. Neuro:   Grossly intact.  Diagnositics/Labs: Spirometry: FEV1: 2.48L 69%, FVC: 3.43L 61% predicted.  Status post Xopenex he has a 2% increase in FEV1 to 2.53 L or 60% predicted  Assessment and plan: Moderate persistent asthma/RADS   - based on your history appears you have had RADS (reactive airway disease syndrome) events where exposures to large amounts of toxic/noxious chemicals can cause asthma symptoms - Neb and teaching provided.  Spacer and teaching provided.  - Daily controller medication(s): Symbicort 160/4.46mg two puffs twice daily with spacer - Rescue medications and if needed prior to physical activity: Xopenex 2 puffs or Xopenex 1 vial via nebulizer 10-15 minutes before physical activity. - Asthma control  goals:  * Full participation in all desired activities (may need albuterol before activity) * Albuterol use two time or less a week on average (not counting use with activity) * Cough interfering with sleep two time or less a month * Oral steroids no more than once a year * No hospitalizations  Rhinoconjunctivitis - you will return next week for skin testing.  Hold your antihistamine for 3 days prior - continue Zyrtec and Flonase for now.  Management may change based on allergy testing results  - will plan to test common food allergens with skin testing  Follow-up in 3 months for routine visit and next week for skin testing  I appreciate the opportunity to take part in Kazimir's care. Please do not hesitate to contact me with questions.  Sincerely,   SPrudy Feeler MD Allergy/Immun[facility] [place]

## 2022-02-11 NOTE — Patient Instructions (Addendum)
-  based on your history appears you have had RADS (reactive airway disease syndrome) events where exposures to large amounts of toxic/noxious chemicals can cause asthma symptoms - Neb and teaching provided.  Spacer and teaching provided.  - Daily controller medication(s): Symbicort 160/4.71mg two puffs twice daily with spacer - Rescue medications and if needed prior to physical activity: Xopenex 2 puffs or Xopenex 1 vial via nebulizer 10-15 minutes before physical activity. - Asthma control goals:  * Full participation in all desired activities (may need albuterol before activity) * Albuterol use two time or less a week on average (not counting use with activity) * Cough interfering with sleep two time or less a month * Oral steroids no more than once a year * No hospitalizations  - you will return next week for skin testing.  Hold your antihistamine for 3 days prior - continue Zyrtec and Flonase for now.  Management may change based on allergy testing results  - will plan to test common food allergens with skin testing  Follow-up in 3 months for routine visit and next week for skin testing

## 2022-02-18 ENCOUNTER — Ambulatory Visit (INDEPENDENT_AMBULATORY_CARE_PROVIDER_SITE_OTHER): Payer: 59 | Admitting: Allergy

## 2022-02-18 ENCOUNTER — Encounter: Payer: Self-pay | Admitting: Allergy

## 2022-02-18 ENCOUNTER — Other Ambulatory Visit: Payer: Self-pay

## 2022-02-18 VITALS — BP 140/90 | HR 66 | Temp 98.3°F | Resp 16 | Ht 74.5 in | Wt 394.0 lb

## 2022-02-18 DIAGNOSIS — H1013 Acute atopic conjunctivitis, bilateral: Secondary | ICD-10-CM

## 2022-02-18 DIAGNOSIS — J683 Other acute and subacute respiratory conditions due to chemicals, gases, fumes and vapors: Secondary | ICD-10-CM

## 2022-02-18 DIAGNOSIS — J4540 Moderate persistent asthma, uncomplicated: Secondary | ICD-10-CM

## 2022-02-18 DIAGNOSIS — J3089 Other allergic rhinitis: Secondary | ICD-10-CM | POA: Diagnosis not present

## 2022-02-18 MED ORDER — LEVALBUTEROL HCL 0.63 MG/3ML IN NEBU: 0.6300 mg | INHALATION_SOLUTION | Freq: Four times a day (QID) | RESPIRATORY_TRACT | 2 refills | Status: DC | PRN

## 2022-02-18 NOTE — Progress Notes (Signed)
Follow-up Note  RE: [name] MRN: [id number] DOB: 1960-05-08 Date of Office Visit: 02/18/2022   History of present illness: Eduardo Duncan is a 62 y.o. male presenting today for skin testing visit.  He was last seen for initial visit on 02/11/22 by myself for asthma and rhinoconjunctivitis.  He has not taken any antihistamines in past 3 days or more for testing today.   He states he did receive the symbicort and xopenex inhaler from pharmacy.  He states they only had 2 medications they received.  Thus he did not get the xopenex nebs for nebulizer despite an order being placed.  Will resend this today.   He states he just picked up the symbicort yesterday and it is still in the packaging thus has not used yet to see if he will tolerate.  He did read the side effect profile.     Medication List: Current Outpatient Medications  Medication Sig Dispense Refill   acetaminophen (TYLENOL) 500 MG tablet Take 500 mg by mouth every 6 (six) hours as needed.     aspirin EC 81 MG tablet Take 81 mg by mouth daily. Swallow whole.     carvedilol (COREG) 12.5 MG tablet TAKE 1 TABLET BY MOUTH TWICE DAILY WITH MEALS 180 tablet 3   cetirizine (ZYRTEC) 10 MG tablet Take 10 mg by mouth daily.     famotidine (PEPCID) 20 MG tablet Take 1/2 (one-half) tablet by mouth twice daily 90 tablet 0   fluticasone (FLONASE) 50 MCG/ACT nasal spray Place 1 spray into both nostrils daily.     ibuprofen (ADVIL,MOTRIN) 200 MG tablet Take 600 mg by mouth 3 (three) times daily as needed.      levalbuterol (XOPENEX HFA) 45 MCG/ACT inhaler Inhale 2 puffs into the lungs every 4 (four) hours as needed for wheezing. 15 g 1   simvastatin (ZOCOR) 40 MG tablet TAKE 1 TABLET BY MOUTH AT BEDTIME 90 tablet 3   SYMBICORT 160-4.5 MCG/ACT inhaler Inhale 2 puffs into the lungs in the morning and at bedtime. 10.2 g 5   valsartan (DIOVAN) 160 MG tablet Take 1 tablet by mouth once daily 90 tablet 3   levalbuterol (XOPENEX) 0.63 MG/3ML  nebulizer solution Take 3 mLs (0.63 mg total) by nebulization 4 (four) times daily as needed for wheezing or shortness of breath. (Patient not taking: Reported on 02/18/2022) 3 mL 2   No current facility-administered medications for this visit.     Known medication allergies: Allergies  Allergen Reactions   Ace Inhibitors Cough   Advair Diskus [Fluticasone-Salmeterol] Other (See Comments)    Confusion and paranoid   Codeine Nausea Only    REACTION: nausea   Gabapentin Other (See Comments)    Facial and left shoulder numbness   Montelukast Sodium Other (See Comments)    Confusion and paranoid   Prednisone Other (See Comments)    Confusion and paranoid   Prednisolone Anxiety     Physical examination (limited): Blood pressure (!) 140/90, pulse 66, temperature 98.3 F (36.8 C), resp. rate 16, height 6' 2.5" (1.892 m), weight (!) 394 lb (178.7 kg), SpO2 95 %.  General: Alert, interactive, in no acute distress. Lungs: Clear to auscultation without wheezing, rhonchi or rales. {no increased work of breathing. CV: Normal S1, S2 without murmurs. Skin: Warm and dry, without lesions or rashes. Extremities:  No clubbing, cyanosis or edema. Neuro:   Grossly intact.  Diagnositics/Labs:  Allergy testing:   Airborne Adult Perc - 02/18/22 1335  Time Antigen Placed 1335    Allergen Manufacturer Greer    Location Back    Number of Test 59    Panel 1 Select    1. Control-Buffer 50% Glycerol Negative    2. Control-Histamine 1 mg/ml 2+    3. Albumin saline Negative    4. Cherry Negative    5. [place] Negative    7. Spencer Blue Negative    8. Meadow Fescue Negative    9. Perennial Rye Negative    10. Sweet Vernal Negative    11. Timothy Negative    12. Cocklebur Negative    13. Burweed Marshelder Negative    14. Ragweed, short Negative    15. Ragweed, Giant Negative    16. Plantain,  English Negative    17. Lamb's Quarters Negative    18. Sheep Sorrell Negative    19. Rough  Pigweed Negative    20. Marsh Elder, Rough Negative    21. Mugwort, Common Negative    22. Ash mix Negative    23. Birch mix Negative    24. Beech American Negative    25. Box, Elder Negative    26. Cedar, red Negative    27. Cottonwood, [place] Negative    28. Elm mix Negative    29. Hickory Negative    30. Maple mix Negative    31. Oak, [place] mix Negative    32. Pecan Pollen Negative    33. Pine mix Negative    34. Sycamore Eastern Negative    35. Forestdale, Black Pollen Negative    36. Alternaria alternata Negative    37. Cladosporium Herbarum Negative    38. Aspergillus mix Negative    39. Penicillium mix Negative    40. Bipolaris sorokiniana (Helminthosporium) Negative    41. Drechslera spicifera (Curvularia) Negative    42. Mucor plumbeus Negative    43. Fusarium moniliforme Negative    44. Aureobasidium pullulans (pullulara) Negative    45. Rhizopus oryzae Negative    46. Botrytis cinera Negative    47. Epicoccum nigrum Negative    48. Phoma betae Negative    49. Candida Albicans Negative    50. Trichophyton mentagrophytes Negative    51. Mite, D Farinae  5,000 AU/ml Negative    52. Mite, D Pteronyssinus  5,000 AU/ml Negative    53. Cat Hair 10,000 BAU/ml Negative    54.  Dog Epithelia Negative    55. Mixed Feathers Negative    56. Horse Epithelia Negative    57. Cockroach, German Negative    58. Mouse Negative    59. Tobacco Leaf Negative    Comments n             Food Perc - 02/18/22 1335       Test Information   Time Antigen Placed 1335    Allergen Manufacturer Lavella Hammock    Location Back    Number of allergen test 10    Food Select      Food   1. Peanut Negative    2. Soybean food Negative    3. Wheat, whole Negative    4. Sesame Negative    5. Milk, cow Negative    6. Egg White, chicken Negative    7. Casein Negative    8. Shellfish mix Negative    9. Fish mix Negative             Intradermal - 02/18/22 1400     Time Antigen Placed  1427  Allergen Manufacturer Greer    Location Arm    Number of Test 15    Control 2+    Guatemala Negative    Johnson Negative    7 Grass Negative    Ragweed mix Negative    Weed mix Negative    Tree mix Negative    Mold 1 Negative    Mold 2 Negative    Mold 3 Negative    Mold 4 2+    Cat Negative    Dog Negative    Cockroach Negative    Mite mix Negative             Allergy testing results were read and interpreted by provider, documented by clinical staff.   Assessment and plan: Allergic rhinitis with conjunctivitis - Testing today showed: Mold - Copy of test results provided.  - Avoidance measures provided. - Continue Zyrtec and Flonase for now.  You can try Xyzal to replace Zyrtec if effective.  If effective can rotate Xyzal and Zyrtec every 6 months or so  - You can use an extra dose of the antihistamine, if needed, for breakthrough symptoms.  - Recommend nasal saline rinses 1-7 times daily to remove allergens from the nasal cavities as well as help with mucous clearance (this is especially helpful to do before the nasal sprays are given).  This can be done via navage machine.  Use distilled water.  Keep mouth open during rinse.  - Common food allergy testing is negative  Moderate persistent asthma/RADS   - based on your history appears you have had RADS (reactive airway disease syndrome) events where exposures to large amounts of toxic/noxious chemicals can cause asthma symptoms - Daily controller medication(s): Symbicort 160/4.3mg two puffs twice daily with spacer - Rescue medications and if needed prior to physical activity: Xopenex 2 puffs or Xopenex 1 vial via nebulizer 10-15 minutes before physical activity. - Asthma control goals:  * Full participation in all desired activities (may need albuterol before activity) * Albuterol use two time or less a week on average (not counting use with activity) * Cough interfering with sleep two time or less a month * Oral  steroids no more than once a year * No hospitalizations  Follow-up in 3 months for routine visit  I appreciate the opportunity to take part in Jarreau's care. Please do not hesitate to contact me with questions.  Sincerely,   SPrudy Feeler MD Allergy/Immunology [facility]

## 2022-02-18 NOTE — Patient Instructions (Signed)
-   based on your history appears you have had RADS (reactive airway disease syndrome) events where exposures to large amounts of toxic/noxious chemicals can cause asthma symptoms - Daily controller medication(s): Symbicort 160/4.57mg two puffs twice daily with spacer - Rescue medications and if needed prior to physical activity: Xopenex 2 puffs or Xopenex 1 vial via nebulizer 10-15 minutes before physical activity. - Asthma control goals:  * Full participation in all desired activities (may need albuterol before activity) * Albuterol use two time or less a week on average (not counting use with activity) * Cough interfering with sleep two time or less a month * Oral steroids no more than once a year * No hospitalizations  - Testing today showed: Mold - Copy of test results provided.  - Avoidance measures provided. - Continue Zyrtec and Flonase for now.  You can try Xyzal to replace Zyrtec if effective.  If effective can rotate Xyzal and Zyrtec every 6 months or so  - You can use an extra dose of the antihistamine, if needed, for breakthrough symptoms.  - Recommend nasal saline rinses 1-7 times daily to remove allergens from the nasal cavities as well as help with mucous clearance (this is especially helpful to do before the nasal sprays are given).  This can be done via navage machine.  Use distilled water.  Keep mouth open during rinse.   - Common food allergy testing is negative   Follow-up in 3 months for routine visit

## 2022-03-12 DIAGNOSIS — J45998 Other asthma: Secondary | ICD-10-CM | POA: Diagnosis not present

## 2022-03-12 DIAGNOSIS — G4733 Obstructive sleep apnea (adult) (pediatric): Secondary | ICD-10-CM | POA: Diagnosis not present

## 2022-03-12 DIAGNOSIS — G4730 Sleep apnea, unspecified: Secondary | ICD-10-CM | POA: Diagnosis not present

## 2022-04-12 DIAGNOSIS — J45998 Other asthma: Secondary | ICD-10-CM | POA: Diagnosis not present

## 2022-04-12 DIAGNOSIS — G4730 Sleep apnea, unspecified: Secondary | ICD-10-CM | POA: Diagnosis not present

## 2022-04-12 DIAGNOSIS — G4733 Obstructive sleep apnea (adult) (pediatric): Secondary | ICD-10-CM | POA: Diagnosis not present

## 2022-04-20 DIAGNOSIS — Z1211 Encounter for screening for malignant neoplasm of colon: Secondary | ICD-10-CM | POA: Diagnosis not present

## 2022-04-21 ENCOUNTER — Other Ambulatory Visit: Payer: 59

## 2022-04-21 DIAGNOSIS — Z961 Presence of intraocular lens: Secondary | ICD-10-CM | POA: Diagnosis not present

## 2022-04-21 DIAGNOSIS — Z1211 Encounter for screening for malignant neoplasm of colon: Secondary | ICD-10-CM

## 2022-04-21 DIAGNOSIS — H43813 Vitreous degeneration, bilateral: Secondary | ICD-10-CM | POA: Diagnosis not present

## 2022-04-24 LAB — FECAL OCCULT BLOOD, IMMUNOCHEMICAL: Fecal Occult Bld: NEGATIVE

## 2022-04-30 ENCOUNTER — Encounter: Payer: Self-pay | Admitting: Student in an Organized Health Care Education/Training Program

## 2022-05-12 DIAGNOSIS — J45998 Other asthma: Secondary | ICD-10-CM | POA: Diagnosis not present

## 2022-05-19 ENCOUNTER — Other Ambulatory Visit: Payer: Self-pay

## 2022-05-19 ENCOUNTER — Encounter: Payer: Self-pay | Admitting: Allergy

## 2022-05-19 ENCOUNTER — Ambulatory Visit (INDEPENDENT_AMBULATORY_CARE_PROVIDER_SITE_OTHER): Payer: 59 | Admitting: Allergy

## 2022-05-19 VITALS — BP 130/90 | HR 74 | Temp 98.3°F | Resp 20 | Ht 74.5 in | Wt >= 6400 oz

## 2022-05-19 DIAGNOSIS — J3089 Other allergic rhinitis: Secondary | ICD-10-CM

## 2022-05-19 DIAGNOSIS — J683 Other acute and subacute respiratory conditions due to chemicals, gases, fumes and vapors: Secondary | ICD-10-CM

## 2022-05-19 DIAGNOSIS — H1013 Acute atopic conjunctivitis, bilateral: Secondary | ICD-10-CM | POA: Diagnosis not present

## 2022-05-19 DIAGNOSIS — J4540 Moderate persistent asthma, uncomplicated: Secondary | ICD-10-CM

## 2022-05-19 NOTE — Progress Notes (Unsigned)
Follow-up Note  RE: Eduardo Duncan MRN: [id number] DOB: Mar 09, 1960 Date of Office Visit: 05/19/2022   History of present illness: Eduardo Duncan is a 62 y.o. male presenting today for follow-up of allergic rhinitis with conjunctivitis and asthma/RADS.  He was last seen in the office on 02/18/2022 by myself. He had to stop the Symbicort.  He states he was "bouncing off the wall".  Every little noise will be amplified and will cause him to jump.  He also is causing increased anxiety and confusion.  This did resolve with stopping the Symbicort.  He has had similar intolerances to other asthma based therapies in the past.  Because of lack of a controller medication he has been using Xopenex about every other day. Nebulizer use about once a week.  He does ask if he might be a candidate for use of airsupra.  He is still noting shortness of breath and cough sting as to the need for his rescue medication.  He does have mold in the home and this is the allergen he was positive on testing to.  He is taking Zyrtec and Flonase.   Review of systems: Review of Systems  Constitutional: Negative.   HENT: Negative.    Eyes: Negative.   Respiratory:  Positive for cough and shortness of breath.   Cardiovascular: Negative.   Musculoskeletal: Negative.   Skin: Negative.   Allergic/Immunologic: Negative.   Neurological: Negative.      All other systems negative unless noted above in HPI  Past medical/social/surgical/family history have been reviewed and are unchanged unless specifically indicated below.  No changes  Medication List: Current Outpatient Medications  Medication Sig Dispense Refill   acetaminophen (TYLENOL) 500 MG tablet Take 500 mg by mouth every 6 (six) hours as needed.     aspirin EC 81 MG tablet Take 81 mg by mouth daily. Swallow whole.     carvedilol (COREG) 12.5 MG tablet TAKE 1 TABLET BY MOUTH TWICE DAILY WITH MEALS 180 tablet 3   cetirizine (ZYRTEC) 10 MG tablet Take 10 mg by  mouth daily.     famotidine (PEPCID) 20 MG tablet Take 1/2 (one-half) tablet by mouth twice daily 90 tablet 0   fluticasone (FLONASE) 50 MCG/ACT nasal spray Place 1 spray into both nostrils daily.     ibuprofen (ADVIL,MOTRIN) 200 MG tablet Take 600 mg by mouth 3 (three) times daily as needed.      levalbuterol (XOPENEX HFA) 45 MCG/ACT inhaler Inhale 2 puffs into the lungs every 4 (four) hours as needed for wheezing. 15 g 1   levalbuterol (XOPENEX) 0.63 MG/3ML nebulizer solution Take 3 mLs (0.63 mg total) by nebulization 4 (four) times daily as needed for wheezing or shortness of breath. 3 mL 2   simvastatin (ZOCOR) 40 MG tablet TAKE 1 TABLET BY MOUTH AT BEDTIME 90 tablet 3   valsartan (DIOVAN) 160 MG tablet Take 1 tablet by mouth once daily 90 tablet 3   SYMBICORT 160-4.5 MCG/ACT inhaler Inhale 2 puffs into the lungs in the morning and at bedtime. (Patient not taking: Reported on 05/19/2022) 10.2 g 5   No current facility-administered medications for this visit.     Known medication allergies: Allergies  Allergen Reactions   Ace Inhibitors Cough   Advair Diskus [Fluticasone-Salmeterol] Other (See Comments)    Confusion and paranoid   Codeine Nausea Only    REACTION: nausea   Gabapentin Other (See Comments)    Facial and left shoulder numbness   Montelukast  Sodium Other (See Comments)    Confusion and paranoid   Prednisone Other (See Comments)    Confusion and paranoid   Prednisolone Anxiety     Physical examination: Blood pressure (!) 130/90, pulse 74, temperature 98.3 F (36.8 C), resp. rate 20, height 6' 2.5" (1.892 m), weight (!) 413 lb 9.6 oz (187.6 kg), SpO2 94 %.  General: Alert, interactive, in no acute distress. HEENT: PERRLA, TMs pearly gray, turbinates minimally edematous without discharge, post-pharynx non erythematous. Neck: Supple without lymphadenopathy. Lungs: Clear to auscultation without wheezing, rhonchi or rales. {no increased work of breathing. CV: Normal S1,  S2 without murmurs. Abdomen: Nondistended, nontender. Skin: Warm and dry, without lesions or rashes. Extremities:  No clubbing, cyanosis or edema. Neuro:   Grossly intact.  Diagnositics/Labs: None today  Assessment and plan: Moderate persistent asthma/RADS   - based on your history appears you have had RADS (reactive airway disease syndrome) events where exposures to large amounts of toxic/noxious chemicals can cause asthma symptoms - stop Symbicort due to intolerance - Daily controller medication(s): none at this time - Rescue medications and if needed prior to physical activity:  Trial AirSupra 2 puffs as needed every 4 hours.  This can be used instead of Xopenex inhaler.    If he does tolerate use of air supra then I would want him to use budesonide vials via nebulizer for his maintenance. Xopenex 2 puffs or Xopenex 1 vial via nebulizer 10-15 minutes before physical activity. - Asthma control goals:  * Full participation in all desired activities (may need albuterol before activity) * Albuterol use two time or less a week on average (not counting use with activity) * Cough interfering with sleep two time or less a month * Oral steroids no more than once a year * No hospitalizations  Allergic rhinitis with conjunctivitis - Continue avoidance measures for mold - Continue Zyrtec and Flonase for now.  You can try Xyzal to replace Zyrtec if effective.  If effective can rotate Xyzal and Zyrtec every 6 months or so  You can replace Flonase with Nasonex - You can use an extra dose of the antihistamine, if needed, for breakthrough symptoms.  - Recommend nasal saline rinses 1-7 times daily to remove allergens from the nasal cavities as well as help with mucous clearance (this is especially helpful to do before the nasal sprays are given).  This can be done via navage machine.  Use distilled water.  Keep mouth open during rinse.   - Common food allergy testing was negative  Let me know in a  week if you tolerate use of Airsupra Follow-up in 3-4 months for routine visit  I appreciate the opportunity to take part in Lani's care. Please do not hesitate to contact me with questions.  Sincerely,   Margo Aye, MD Allergy/Immunology [facility]

## 2022-05-19 NOTE — Patient Instructions (Addendum)
-   based on your history appears you have had RADS (reactive airway disease syndrome) events where exposures to large amounts of toxic/noxious chemicals can cause asthma symptoms - stop Symbicort due to intolerance - Daily controller medication(s): none at this time - Rescue medications and if needed prior to physical activity:  Trial AirSupra 2 puffs as needed every 4 hours.  This can be used instead of Xopenex inhaler.    Xopenex 2 puffs or Xopenex 1 vial via nebulizer 10-15 minutes before physical activity. - Asthma control goals:  * Full participation in all desired activities (may need albuterol before activity) * Albuterol use two time or less a week on average (not counting use with activity) * Cough interfering with sleep two time or less a month * Oral steroids no more than once a year * No hospitalizations  - Continue avoidance measures for mold - Continue Zyrtec and Flonase for now.  You can try Xyzal to replace Zyrtec if effective.  If effective can rotate Xyzal and Zyrtec every 6 months or so  You can replace Flonase with Nasonex - You can use an extra dose of the antihistamine, if needed, for breakthrough symptoms.  - Recommend nasal saline rinses 1-7 times daily to remove allergens from the nasal cavities as well as help with mucous clearance (this is especially helpful to do before the nasal sprays are given).  This can be done via navage machine.  Use distilled water.  Keep mouth open during rinse.   - Common food allergy testing is negative  Let me know in a week if you tolerate use of Airsupra Follow-up in 3-4 months for routine visit

## 2022-05-28 DIAGNOSIS — G4730 Sleep apnea, unspecified: Secondary | ICD-10-CM | POA: Diagnosis not present

## 2022-05-28 DIAGNOSIS — G4733 Obstructive sleep apnea (adult) (pediatric): Secondary | ICD-10-CM | POA: Diagnosis not present

## 2022-06-08 ENCOUNTER — Telehealth: Payer: Self-pay | Admitting: Allergy

## 2022-06-08 NOTE — Telephone Encounter (Signed)
Patient called to let you know about the Paulene Floor did help but would like to talk with you about it [phone]

## 2022-06-08 NOTE — Telephone Encounter (Signed)
Called patient - DOB/Pharmacy verified - stated AirSupra does work a little better and lasting longer than the regular Albuterol inhaler.  Patient stated he is doing 2 puffs once day as needed as well.  Patient requested prescription be sent in to [facility], [place] - [address].  Patient advised message would be forwarded to provider as update.  Patient verbalized understanding - reset his myChart passsword - no further questions.

## 2022-06-09 MED ORDER — AIRSUPRA 90-80 MCG/ACT IN AERO: 1.0000 | INHALATION_SPRAY | Freq: Every day | RESPIRATORY_TRACT | 2 refills | Status: DC

## 2022-06-09 NOTE — Telephone Encounter (Signed)
Per provider:  Randie Heinz.  Thanks Crissie Figures in [place] prescription to [facility].

## 2022-06-10 ENCOUNTER — Other Ambulatory Visit: Payer: Self-pay | Admitting: *Deleted

## 2022-06-10 MED ORDER — AIRSUPRA 90-80 MCG/ACT IN AERO: 2.0000 | INHALATION_SPRAY | RESPIRATORY_TRACT | 3 refills | Status: DC | PRN

## 2022-06-10 NOTE — Telephone Encounter (Signed)
[facility] pharmacy sent a letter stating Eduardo Duncan is not covered bu ventolin, levalbuterol is please advise to change in therapy

## 2022-06-10 NOTE — Telephone Encounter (Signed)
Per provider:  Please do PA.   He has not tolerates most asthma inhalers as well as singular due to side effects (hallucinations etc).   He has tolerated Airsupra thus far thus need this specific inhaler for him to use.   Forwarding message to PA Team to initate PA.

## 2022-06-11 ENCOUNTER — Other Ambulatory Visit (HOSPITAL_COMMUNITY): Payer: Self-pay

## 2022-06-11 ENCOUNTER — Telehealth: Payer: Self-pay

## 2022-06-11 NOTE — Telephone Encounter (Signed)
PA has been APPROVED through the end of the year!

## 2022-06-11 NOTE — Telephone Encounter (Signed)
PA request received via provider for Airsupra 90-80MCG/ACT aerosol  PA submitted to [employer] Part D via CMM and is APPROVED from 06/11/2022-01/11/2023  *patient failed most inhalers due to side effects  Key: [id number]

## 2022-06-11 NOTE — Telephone Encounter (Signed)
AirSupra prescription sent to [facility] [address]

## 2022-06-12 ENCOUNTER — Other Ambulatory Visit: Payer: Self-pay | Admitting: Allergy

## 2022-06-12 DIAGNOSIS — J45998 Other asthma: Secondary | ICD-10-CM | POA: Diagnosis not present

## 2022-06-21 ENCOUNTER — Encounter: Payer: Self-pay | Admitting: Student in an Organized Health Care Education/Training Program

## 2022-06-21 ENCOUNTER — Ambulatory Visit (INDEPENDENT_AMBULATORY_CARE_PROVIDER_SITE_OTHER): Payer: 59 | Admitting: Student in an Organized Health Care Education/Training Program

## 2022-06-21 VITALS — BP 140/67 | HR 78 | Temp 97.8°F | Ht 75.0 in | Wt >= 6400 oz

## 2022-06-21 DIAGNOSIS — R319 Hematuria, unspecified: Secondary | ICD-10-CM | POA: Insufficient documentation

## 2022-06-21 DIAGNOSIS — J45909 Unspecified asthma, uncomplicated: Secondary | ICD-10-CM

## 2022-06-21 DIAGNOSIS — I10 Essential (primary) hypertension: Secondary | ICD-10-CM | POA: Diagnosis not present

## 2022-06-21 DIAGNOSIS — R310 Gross hematuria: Secondary | ICD-10-CM

## 2022-06-21 DIAGNOSIS — C4491 Basal cell carcinoma of skin, unspecified: Secondary | ICD-10-CM | POA: Insufficient documentation

## 2022-06-21 DIAGNOSIS — L918 Other hypertrophic disorders of the skin: Secondary | ICD-10-CM | POA: Diagnosis not present

## 2022-06-21 DIAGNOSIS — C44319 Basal cell carcinoma of skin of other parts of face: Secondary | ICD-10-CM

## 2022-06-21 NOTE — Progress Notes (Signed)
Established Patient Office Visit  Subjective   Patient ID: Eduardo Duncan, male    DOB: Jul 18, 1960  Age: 62 y.o. MRN: [id number]   HPI  62 year old person here for follow-up of hypertension.  Doing well since I last saw him.  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  Has some negative social determinants of health including issues with transportation that prevented him from accessing local resources in the gym.  No issues with food security.  Able to access most of his medications with the exception of new asthma inhaler.  Has acute concern today of an inflamed skin tag under his left breast which has been bleeding recently.  No recent illness, no fevers or chills, no chest pain or dyspnea with exertion.    Objective:     BP (!) 140/67 (BP Location: Right Arm, Patient Position: Sitting, Cuff Size: Small)   Pulse 78   Temp 97.8 F (36.6 C) (Oral)   Ht 6\' 3"  (1.905 m)   Wt (!) 409 lb 4.8 oz (185.7 kg)   SpO2 96%   BMI 51.16 kg/m    Physical Exam  Gen: Well-appearing man, no distress Skin: There is a 6 cm inflamed skin tag on his left chest within the skin fold beneath the breast.  He has some ulceration and recent bleeding, no pigmentation.  Dresses skin exam shows multiple left seborrheic keratoses, and a possible basal cell carcinoma on the right forehead. Ext: Warm well-perfused no lower extremity edema Abd: Obese, soft, not tender Neuro: Alert, conversational, full strength in the upper and lower extremities, wide-based unstable gait Psych: Not depressed or anxious appearing    Assessment & Plan:   Problem List Items Addressed This Visit       High   Essential hypertension (Chronic)    Blood pressure well-controlled today.  Plan to continue with valsartan 160 mg daily and carvedilol 12.5 mg twice daily.  Will check labs today.      Relevant Orders   BMP8+Anion Gap   Lipid Profile      Medium    Asthma (Chronic)    Recently established care with allergy immunology again.  They are managing the asthma.  Having some trouble finding a inhaled steroid that he can tolerate and have access to.  Symptoms currently doing okay, seems to benefit from Airsupra sample, but only using twice a week right now.  This was much more tolerable for him than Symbicort formulations.  Also using albuterol as needed.  No wheezing on exam today.      Relevant Medications   albuterol (VENTOLIN HFA) 108 (90 Base) MCG/ACT inhaler     Unprioritized   Hematuria - Primary    Patient reports recent history of dark tea colored urine.  May represent hematuria.  Will check urinalysis.      Relevant Orders   Urinalysis, Reflex Microscopic   Skin tag    There was an inflamed 6 mm skin tag under his left breast in the skin fold with some signs of irritation and bleeding.  This was uncomfortable for him especially while he was cleaning himself.  No pigmentation, no signs of skin cancer.  After informed consent was obtained I removed the skin tag with a shave biopsy.  Shave Biopsy Procedure Note  Pre-operative Diagnosis: Inflamed skin tag  Locations: Left chest wall under the breast, in the skin fold  Anesthesia: Lidocaine 1% without epinephrine  Procedure  Details  History of allergy to lidocaine: no Sensitivity to epinephrine: no  Patient informed of the risks (including bleeding and infection) and benefits of the  procedure and verbal informed consent obtained.  The lesion and surrounding area was prepped with an alcohol swab. A scalpel or Dermablade was used to shave an area of skin.  Hemostasis achieved with silver nitrate. Sterile dressing applied.  The specimen was not sent for pathologic examination. The patient tolerated the procedure well.  Condition: Stable  Complications: none.  Plan: 1. Instructed to keep the wound dry and covered for 24-48h and clean thereafter. 2. Patient  instructed to apply Vaseline daily until healed. 3. Warning signs of infection were reviewed.         Basal cell carcinoma (BCC)    Several month history of a lesion on his right forehead that looks consistent with a possible basal cell carcinoma.  Could also be squamous cell carcinoma or possibly a large actinic keratosis.  Will refer to dermatology to consider biopsy or Mohs.      Relevant Orders   Ambulatory referral to Dermatology    Return in about 6 months (around 12/21/2022).    Tyson Alias, MD

## 2022-06-21 NOTE — Assessment & Plan Note (Signed)
There was an inflamed 6 mm skin tag under his left breast in the skin fold with some signs of irritation and bleeding.  This was uncomfortable for him especially while he was cleaning himself.  No pigmentation, no signs of skin cancer.  After informed consent was obtained I removed the skin tag with a shave biopsy.  Shave Biopsy Procedure Note  Pre-operative Diagnosis: Inflamed skin tag  Locations: Left chest wall under the breast, in the skin fold  Anesthesia: Lidocaine 1% without epinephrine  Procedure Details  History of allergy to lidocaine: no Sensitivity to epinephrine: no  Patient informed of the risks (including bleeding and infection) and benefits of the  procedure and verbal informed consent obtained.  The lesion and surrounding area was prepped with an alcohol swab. A scalpel or Dermablade was used to shave an area of skin.  Hemostasis achieved with silver nitrate. Sterile dressing applied.  The specimen was not sent for pathologic examination. The patient tolerated the procedure well.  Condition: Stable  Complications: none.  Plan: 1. Instructed to keep the wound dry and covered for 24-48h and clean thereafter. 2. Patient instructed to apply Vaseline daily until healed. 3. Warning signs of infection were reviewed.

## 2022-06-21 NOTE — Assessment & Plan Note (Signed)
Several month history of a lesion on his right forehead that looks consistent with a possible basal cell carcinoma.  Could also be squamous cell carcinoma or possibly a large actinic keratosis.  Will refer to dermatology to consider biopsy or Mohs.

## 2022-06-21 NOTE — Assessment & Plan Note (Addendum)
Blood pressure well-controlled today.  Plan to continue with valsartan 160 mg daily and carvedilol 12.5 mg twice daily.  Will check labs today.

## 2022-06-21 NOTE — Assessment & Plan Note (Signed)
Recently established care with allergy immunology again.  They are managing the asthma.  Having some trouble finding a inhaled steroid that he can tolerate and have access to.  Symptoms currently doing okay, seems to benefit from Airsupra sample, but only using twice a week right now.  This was much more tolerable for him than Symbicort formulations.  Also using albuterol as needed.  No wheezing on exam today.

## 2022-06-21 NOTE — Assessment & Plan Note (Signed)
Patient reports recent history of dark tea colored urine.  May represent hematuria.  Will check urinalysis.

## 2022-06-22 LAB — BMP8+ANION GAP
Anion Gap: 13 mmol/L (ref 10.0–18.0)
BUN/Creatinine Ratio: 15 (ref 10–24)
BUN: 13 mg/dL (ref 8–27)
CO2: 20 mmol/L (ref 20–29)
Calcium: 9.1 mg/dL (ref 8.6–10.2)
Chloride: 105 mmol/L (ref 96–106)
Creatinine, Ser: 0.89 mg/dL (ref 0.76–1.27)
Glucose: 104 mg/dL — ABNORMAL HIGH (ref 70–99)
Potassium: 4.6 mmol/L (ref 3.5–5.2)
Sodium: 138 mmol/L (ref 134–144)
eGFR: 96 mL/min/{1.73_m2} (ref >59)

## 2022-06-22 LAB — URINALYSIS, ROUTINE W REFLEX MICROSCOPIC
Bilirubin, UA: NEGATIVE
Glucose, UA: NEGATIVE
Ketones, UA: NEGATIVE
Nitrite, UA: POSITIVE — AB
Protein,UA: NEGATIVE
RBC, UA: NEGATIVE
Specific Gravity, UA: 1.018 (ref 1.005–1.030)
Urobilinogen, Ur: 0.2 mg/dL (ref 0.2–1.0)
pH, UA: 5 (ref 5.0–7.5)

## 2022-06-22 LAB — LIPID PANEL
Chol/HDL Ratio: 3.9 ratio (ref 0.0–5.0)
Cholesterol, Total: 153 mg/dL (ref 100–199)
HDL: 39 mg/dL — ABNORMAL LOW (ref >39)
LDL Chol Calc (NIH): 78 mg/dL (ref 0–99)
Triglycerides: 218 mg/dL — ABNORMAL HIGH (ref 0–149)
VLDL Cholesterol Cal: 36 mg/dL (ref 5–40)

## 2022-06-22 LAB — MICROSCOPIC EXAMINATION
Casts: NONE SEEN /lpf
RBC, Urine: NONE SEEN /hpf (ref 0–2)

## 2022-06-23 ENCOUNTER — Ambulatory Visit (INDEPENDENT_AMBULATORY_CARE_PROVIDER_SITE_OTHER): Payer: 59

## 2022-06-23 VITALS — Ht 74.0 in | Wt >= 6400 oz

## 2022-06-23 DIAGNOSIS — Z0000 Encounter for general adult medical examination without abnormal findings: Secondary | ICD-10-CM | POA: Diagnosis not present

## 2022-06-23 NOTE — Progress Notes (Signed)
I connected with  Eduardo Duncan on 06/23/22 by a audio enabled telemedicine application and verified that I am speaking with the correct person using two identifiers.  Patient Location: Home  Provider Location: Office/Clinic  I discussed the limitations of evaluation and management by telemedicine. The patient expressed understanding and agreed to proceed. Subjective:   Eduardo Duncan is a 62 y.o. male who presents for Medicare Annual/Subsequent preventive examination.  Review of Systems     Cardiac Risk Factors include: advanced age (>11men, >63 women);hypertension;obesity (BMI >30kg/m2)     Objective:    Today's Vitals   06/23/22 1607  Weight: (!) 409 lb (185.5 kg)  Height: 6\' 2"  (1.88 m)  PainSc: 7    Body mass index is 52.51 kg/m.     06/23/2022    4:14 PM 06/21/2022   10:37 AM 12/21/2021   10:37 AM 06/15/2021   12:18 PM 10/02/2020    2:59 PM 04/28/2020    9:52 AM 04/30/2019   10:33 AM  Advanced Directives  Does Patient Have a Medical Advance Directive? No No No No No No No  Would patient like information on creating a medical advance directive?  No - Patient declined No - Patient declined No - Patient declined Yes (MAU/Ambulatory/Procedural Areas - Information given) No - Patient declined No - Patient declined    Current Medications (verified) Outpatient Encounter Medications as of 06/23/2022  Medication Sig   acetaminophen (TYLENOL) 500 MG tablet Take 500 mg by mouth every 6 (six) hours as needed.   albuterol (VENTOLIN HFA) 108 (90 Base) MCG/ACT inhaler Inhale 2 puffs into the lungs every 4 (four) hours as needed.   aspirin EC 81 MG tablet Take 81 mg by mouth daily. Swallow whole.   carvedilol (COREG) 12.5 MG tablet TAKE 1 TABLET BY MOUTH TWICE DAILY WITH MEALS   cetirizine (ZYRTEC) 10 MG tablet Take 10 mg by mouth daily.   famotidine (PEPCID) 20 MG tablet Take 1/2 (one-half) tablet by mouth twice daily   fluticasone (FLONASE) 50 MCG/ACT nasal spray Place 1 spray  into both nostrils daily.   ibuprofen (ADVIL,MOTRIN) 200 MG tablet Take 600 mg by mouth 3 (three) times daily as needed.    ipratropium (ATROVENT) 0.03 % nasal spray Place 2 sprays into both nostrils.   levalbuterol (XOPENEX HFA) 45 MCG/ACT inhaler Inhale 2 puffs into the lungs every 4 (four) hours as needed for wheezing.   levalbuterol (XOPENEX) 0.63 MG/3ML nebulizer solution Take 3 mLs (0.63 mg total) by nebulization 4 (four) times daily as needed for wheezing or shortness of breath.   simvastatin (ZOCOR) 40 MG tablet TAKE 1 TABLET BY MOUTH AT BEDTIME   valsartan (DIOVAN) 160 MG tablet Take 1 tablet by mouth once daily   AIRSUPRA 90-80 MCG/ACT AERO Inhale 2 puffs into the lungs every 4 (four) hours as needed. (Patient not taking: Reported on 06/23/2022)   AIRSUPRA 90-80 MCG/ACT AERO INHALE 1 PUFF ONCE DAILY MAY  INCREASE  TO  2  PUFFS  ONCE  A  DAY  AS  NEEDED (Patient not taking: Reported on 06/23/2022)   No facility-administered encounter medications on file as of 06/23/2022.    Allergies (verified) Ace inhibitors, Advair diskus [fluticasone-salmeterol], Codeine, Gabapentin, Montelukast sodium, Prednisone, and Prednisolone   History: Past Medical History:  Diagnosis Date   Asthma    Cervicogenic headache 12/25/2012   Depression    Hyperlipidemia    Hypertension    Obesity    Obstructive sleep apnea  On CPAP   Polycythemia rubra vera (HCC)    Spinal stenosis of lumbar region    Past Surgical History:  Procedure Laterality Date   CHOLECYSTECTOMY  07/2009   Lap   CHOLECYSTECTOMY  07/11/2009   COLONOSCOPY  01/2009   Family History  Problem Relation Age of Onset   Diabetes Mother    Hypertension Mother    Alzheimer's disease Mother    Heart disease Mother    Heart disease Father    Heart disease Brother    Colon cancer Brother 56   Stroke Brother    Cancer Maternal Grandmother    Ovarian cancer Paternal Grandmother    Social History   Socioeconomic History   Marital  status: Single    Spouse name: Not on file   Number of children: 0   Years of education: trade   Highest education level: Not on file  Occupational History   Occupation: disability     Employer: DISABLED    Comment: 10/2006--has not worked since 2004  Tobacco Use   Smoking status: Former    Packs/day: 1.00    Years: 12.00    Additional pack years: 0.00    Total pack years: 12.00    Types: Cigarettes    Quit date: 03/13/1986    Years since quitting: 36.3    Passive exposure: Past   Smokeless tobacco: Never  Vaping Use   Vaping Use: Never used  Substance and Sexual Activity   Alcohol use: No    Alcohol/week: 0.0 standard drinks of alcohol    Comment: No alcohol for 2 years--used to drink heavily in late teens   Drug use: No   Sexual activity: Not Currently  Other Topics Concern   Not on file  Social History Narrative   Current Social History 10/02/2020        Patient lives alone in a/an home / condo / townhome which is 1 story/stories. There are not steps up to the entrance the patient uses.       Patient's method of transportation is via family member, but plans to start driving soon      The highest level of education was high school diploma.      The patient currently disabled.      Identified important Relationships are Family       Pets : No       Interests / Fun: "[facility], I would like to get back there soon"       Current Stressors: None    Social Determinants of Health   Financial Resource Strain: Low Risk  (06/23/2022)   Overall Financial Resource Strain (CARDIA)    Difficulty of Paying Living Expenses: Not hard at all  Food Insecurity: No Food Insecurity (06/23/2022)   Hunger Vital Sign    Worried About Running Out of Food in the Last Year: Never true    Ran Out of Food in the Last Year: Never true  Transportation Needs: No Transportation Needs (06/23/2022)   PRAPARE - Administrator, Civil Service (Medical): No     Lack of Transportation (Non-Medical): No  Physical Activity: Inactive (06/23/2022)   Exercise Vital Sign    Days of Exercise per Week: 0 days    Minutes of Exercise per Session: 0 min  Stress: No Stress Concern Present (06/23/2022)   [employer] of Occupational Health - Occupational Stress Questionnaire    Feeling of Stress : Only a  little  Social Connections: Patient Declined (06/23/2022)   Social Connection and Isolation Panel [NHANES]    Frequency of Communication with Friends and Family: Patient declined    Frequency of Social Gatherings with Friends and Family: Patient declined    Attends Religious Services: Patient declined    Database administrator or Organizations: Patient declined    Attends Engineer, structural: Patient declined    Marital Status: Patient declined    Tobacco Counseling Counseling given: Not Answered   Clinical Intake:  Pre-visit preparation completed: Yes  Pain : 0-10 Pain Score: 7  Pain Type: Chronic pain Pain Location: Back Pain Orientation: Lower, Upper, Mid Pain Descriptors / Indicators: Aching Pain Onset: More than a month ago Pain Frequency: Constant     Nutritional Status: BMI > 30  Obese Nutritional Risks: None Diabetes: No  How often do you need to have someone help you when you read instructions, pamphlets, or other written materials from your doctor or pharmacy?: 1 - Never  Diabetic? no  Interpreter Needed?: No  Information entered by :: NAllen LPN   Activities of Daily Living    06/23/2022    4:15 PM 06/21/2022   10:34 AM  In your present state of health, do you have any difficulty performing the following activities:  Hearing? 0 0  Vision? 0 0  Difficulty concentrating or making decisions? 0 0  Walking or climbing stairs? 0 1  Dressing or bathing? 0 0  Doing errands, shopping? 0 0  Preparing Food and eating ? N   Using the Toilet? N   In the past six months, have you accidently leaked urine? N   Do  you have problems with loss of bowel control? N   Managing your Medications? N   Managing your Finances? N   Housekeeping or managing your Housekeeping? N     Patient Care Team: Tyson Alias, MD as PCP - General Kirsteins, Victorino Sparrow, MD as Consulting Physician (Physical Medicine and Rehabilitation) Bernette Redbird, MD as Consulting Physician (Gastroenterology) Dione Booze, Bertram Millard, MD as Consulting Physician (Ophthalmology) Nyoka Cowden, MD as Consulting Physician (Pulmonary Disease) York Spaniel, MD (Inactive) as Consulting Physician (Neurology)  Indicate any recent Medical Services you may have received from other than [facility] providers in the past year (date may be approximate).     Assessment:   This is a routine wellness examination for Imri.  Hearing/Vision screen Vision Screening - Comments:: Regular eye exams, [facility]  Dietary issues and exercise activities discussed: Current Exercise Habits: The patient does not participate in regular exercise at present   Goals Addressed             This Visit's Progress    Patient Stated       06/23/2022, changing diet       Depression Screen    06/23/2022    4:15 PM 06/21/2022   10:34 AM 12/21/2021   10:44 AM 06/15/2021   12:20 PM 10/02/2020    3:00 PM 04/28/2020   10:40 AM 04/30/2019   11:19 AM  PHQ 2/9 Scores  PHQ - 2 Score 0 0 0 0 3 0 2  PHQ- 9 Score     9  6    Fall Risk    06/23/2022    4:15 PM 06/21/2022   10:33 AM 12/21/2021   10:38 AM 06/15/2021   12:18 PM 10/02/2020    2:55 PM  Fall Risk   Falls in the past year?  0 0 0 0 0  Number falls in past yr: 0 0 0 0   Injury with Fall? 0 0 0 0   Risk for fall due to : Medication side effect      Follow up Falls prevention discussed;Education provided;Falls evaluation completed Falls evaluation completed Falls evaluation completed Falls evaluation completed Falls evaluation completed    FALL RISK PREVENTION PERTAINING TO THE HOME:  Any stairs in  or around the home? Yes  If so, are there any without handrails? No  Home free of loose throw rugs in walkways, pet beds, electrical cords, etc? Yes  Adequate lighting in your home to reduce risk of falls? Yes   ASSISTIVE DEVICES UTILIZED TO PREVENT FALLS:  Life alert? No  Use of a cane, walker or w/c? Yes  Grab bars in the bathroom? No  Shower chair or bench in shower? Yes  Elevated toilet seat or a handicapped toilet? Yes   TIMED UP AND GO:  Was the test performed? No .      Cognitive Function:        06/23/2022    4:17 PM  6CIT Screen  What Year? 0 points  What month? 0 points  What time? 0 points  Count back from 20 0 points  Months in reverse 0 points  Repeat phrase 2 points  Total Score 2 points    Immunizations Immunization History  Administered Date(s) Administered   Moderna Sars-Covid-2 Vaccination 08/08/2019, 09/06/2019, 03/28/2020   Pneumococcal Polysaccharide-23 02/26/2011   Td 12/19/2008   Tdap 04/26/2016    TDAP status: Up to date  Flu Vaccine status: Declined, Education has been provided regarding the importance of this vaccine but patient still declined. Advised may receive this vaccine at local pharmacy or Health Dept. Aware to provide a copy of the vaccination record if obtained from local pharmacy or Health Dept. Verbalized acceptance and understanding.  Pneumococcal vaccine status: Up to date  Covid-19 vaccine status: Completed vaccines  Qualifies for Shingles Vaccine? Yes   Zostavax completed No   Shingrix Completed?: No.    Education has been provided regarding the importance of this vaccine. Patient has been advised to call insurance company to determine out of pocket expense if they have not yet received this vaccine. Advised may also receive vaccine at local pharmacy or Health Dept. Verbalized acceptance and understanding.  Screening Tests Health Maintenance  Topic Date Due   Zoster Vaccines- Shingrix (1 of 2) Never done    Colonoscopy  05/23/2019   COVID-19 Vaccine (4 - 2023-24 season) 09/11/2021   Medicare Annual Wellness (AWV)  10/02/2021   COLON CANCER SCREENING ANNUAL FOBT  04/20/2023   DTaP/Tdap/Td (3 - Td or Tdap) 04/27/2026   Hepatitis C Screening  Completed   HIV Screening  Completed   HPV VACCINES  Aged Out    Health Maintenance  Health Maintenance Due  Topic Date Due   Zoster Vaccines- Shingrix (1 of 2) Never done   Colonoscopy  05/23/2019   COVID-19 Vaccine (4 - 2023-24 season) 09/11/2021   Medicare Annual Wellness (AWV)  10/02/2021    Colorectal cancer screening: Type of screening: FOBT/FIT. Completed 04/20/2022. Repeat every 1 years  Lung Cancer Screening: (Low Dose CT Chest recommended if Age 64-80 years, 30 pack-year currently smoking OR have quit w/in 15years.) does not qualify.   Lung Cancer Screening Referral: no  Additional Screening:  Hepatitis C Screening: does qualify; Completed 04/26/2016  Vision Screening: Recommended annual ophthalmology exams for early detection of glaucoma  and other disorders of the eye. Is the patient up to date with their annual eye exam?  Yes  Who is the provider or what is the name of the office in which the patient attends annual eye exams? [facility] If pt is not established with a provider, would they like to be referred to a provider to establish care? No .   Dental Screening: Recommended annual dental exams for proper oral hygiene  Community Resource Referral / Chronic Care Management: CRR required this visit?  No   CCM required this visit?  No      Plan:     I have personally reviewed and noted the following in the patient's chart:   Medical and social history Use of alcohol, tobacco or illicit drugs  Current medications and supplements including opioid prescriptions. Patient is not currently taking opioid prescriptions. Functional ability and status Nutritional status Physical activity Advanced directives List of other  physicians Hospitalizations, surgeries, and ER visits in previous 12 months Vitals Screenings to include cognitive, depression, and falls Referrals and appointments  In addition, I have reviewed and discussed with patient certain preventive protocols, quality metrics, and best practice recommendations. A written personalized care plan for preventive services as well as general preventive health recommendations were provided to patient.     Barb Merino, LPN   [id number]/[id number]   Nurse Notes: none  Due to this being a virtual visit, the after visit summary with patients personalized plan was offered to patient via mail or my-chart. to pick up at office at next visit

## 2022-06-23 NOTE — Patient Instructions (Signed)
Eduardo Duncan , Thank you for taking time to come for your Medicare Wellness Visit. I appreciate your ongoing commitment to your health goals. Please review the following plan we discussed and let me know if I can assist you in the future.   These are the goals we discussed:  Goals       Blood Pressure < 140/90      Patient Stated (pt-stated)      [facility]"      Patient Stated      06/23/2022, changing diet        This is a list of the screening recommended for you and due dates:  Health Maintenance  Topic Date Due   Zoster (Shingles) Vaccine (1 of 2) Never done   Colon Cancer Screening  05/23/2019   COVID-19 Vaccine (4 - 2023-24 season) 09/11/2021   Stool Blood Test  04/20/2023   Medicare Annual Wellness Visit  06/23/2023   DTaP/Tdap/Td vaccine (3 - Td or Tdap) 04/27/2026   Hepatitis C Screening  Completed   HIV Screening  Completed   HPV Vaccine  Aged Out    Advanced directives: Advance directive discussed with you today. Even though you declined this today please call our office should you change your mind and we can give you the proper paperwork for you to fill out.  Conditions/risks identified: none  Next appointment: Follow up in one year for your annual wellness visit   Preventive Care 40-64 Years, Male Preventive care refers to lifestyle choices and visits with your health care provider that can promote health and wellness. What does preventive care include? A yearly physical exam. This is also called an annual well check. Dental exams once or twice a year. Routine eye exams. Ask your health care provider how often you should have your eyes checked. Personal lifestyle choices, including: Daily care of your teeth and gums. Regular physical activity. Eating a healthy diet. Avoiding tobacco and drug use. Limiting alcohol use. Practicing safe sex. Taking low-dose aspirin every day starting at age 10. What happens during an  annual well check? The services and screenings done by your health care provider during your annual well check will depend on your age, overall health, lifestyle risk factors, and family history of disease. Counseling  Your health care provider may ask you questions about your: Alcohol use. Tobacco use. Drug use. Emotional well-being. Home and relationship well-being. Sexual activity. Eating habits. Work and work Astronomer. Screening  You may have the following tests or measurements: Height, weight, and BMI. Blood pressure. Lipid and cholesterol levels. These may be checked every 5 years, or more frequently if you are over 38 years old. Skin check. Lung cancer screening. You may have this screening every year starting at age 79 if you have a 30-pack-year history of smoking and currently smoke or have quit within the past 15 years. Fecal occult blood test (FOBT) of the stool. You may have this test every year starting at age 48. Flexible sigmoidoscopy or colonoscopy. You may have a sigmoidoscopy every 5 years or a colonoscopy every 10 years starting at age 15. Prostate cancer screening. Recommendations will vary depending on your family history and other risks. Hepatitis C blood test. Hepatitis B blood test. Sexually transmitted disease (STD) testing. Diabetes screening. This is done by checking your blood sugar (glucose) after you have not eaten for a while (fasting). You may have this done every 1-3 years. Discuss your  test results, treatment options, and if necessary, the need for more tests with your health care provider. Vaccines  Your health care provider may recommend certain vaccines, such as: Influenza vaccine. This is recommended every year. Tetanus, diphtheria, and acellular pertussis (Tdap, Td) vaccine. You may need a Td booster every 10 years. Zoster vaccine. You may need this after age 35. Pneumococcal 13-valent conjugate (PCV13) vaccine. You may need this if you have  certain conditions and have not been vaccinated. Pneumococcal polysaccharide (PPSV23) vaccine. You may need one or two doses if you smoke cigarettes or if you have certain conditions. Talk to your health care provider about which screenings and vaccines you need and how often you need them. This information is not intended to replace advice given to you by your health care provider. Make sure you discuss any questions you have with your health care provider. Document Released: 01/24/2015 Document Revised: 09/17/2015 Document Reviewed: 10/29/2014 Elsevier Interactive Patient Education  2017 [employer].  Fall Prevention in the Home Falls can cause injuries. They can happen to people of all ages. There are many things you can do to make your home safe and to help prevent falls. What can I do on the outside of my home? Regularly fix the edges of walkways and driveways and fix any cracks. Remove anything that might make you trip as you walk through a door, such as a raised step or threshold. Trim any bushes or trees on the path to your home. Use bright outdoor lighting. Clear any walking paths of anything that might make someone trip, such as rocks or tools. Regularly check to see if handrails are loose or broken. Make sure that both sides of any steps have handrails. Any raised decks and porches should have guardrails on the edges. Have any leaves, snow, or ice cleared regularly. Use sand or salt on walking paths during winter. Clean up any spills in your garage right away. This includes oil or grease spills. What can I do in the bathroom? Use night lights. Install grab bars by the toilet and in the tub and shower. Do not use towel bars as grab bars. Use non-skid mats or decals in the tub or shower. If you need to sit down in the shower, use a plastic, non-slip stool. Keep the floor dry. Clean up any water that spills on the floor as soon as it happens. Remove soap buildup in the tub or  shower regularly. Attach bath mats securely with double-sided non-slip rug tape. Do not have throw rugs and other things on the floor that can make you trip. What can I do in the bedroom? Use night lights. Make sure that you have a light by your bed that is easy to reach. Do not use any sheets or blankets that are too big for your bed. They should not hang down onto the floor. Have a firm chair that has side arms. You can use this for support while you get dressed. Do not have throw rugs and other things on the floor that can make you trip. What can I do in the kitchen? Clean up any spills right away. Avoid walking on wet floors. Keep items that you use a lot in easy-to-reach places. If you need to reach something above you, use a strong step stool that has a grab bar. Keep electrical cords out of the way. Do not use floor polish or wax that makes floors slippery. If you must use wax, use non-skid floor wax.  Do not have throw rugs and other things on the floor that can make you trip. What can I do with my stairs? Do not leave any items on the stairs. Make sure that there are handrails on both sides of the stairs and use them. Fix handrails that are broken or loose. Make sure that handrails are as long as the stairways. Check any carpeting to make sure that it is firmly attached to the stairs. Fix any carpet that is loose or worn. Avoid having throw rugs at the top or bottom of the stairs. If you do have throw rugs, attach them to the floor with carpet tape. Make sure that you have a light switch at the top of the stairs and the bottom of the stairs. If you do not have them, ask someone to add them for you. What else can I do to help prevent falls? Wear shoes that: Do not have high heels. Have rubber bottoms. Are comfortable and fit you well. Are closed at the toe. Do not wear sandals. If you use a stepladder: Make sure that it is fully opened. Do not climb a closed stepladder. Make  sure that both sides of the stepladder are locked into place. Ask someone to hold it for you, if possible. Clearly mark and make sure that you can see: Any grab bars or handrails. First and last steps. Where the edge of each step is. Use tools that help you move around (mobility aids) if they are needed. These include: Canes. Walkers. Scooters. Crutches. Turn on the lights when you go into a dark area. Replace any light bulbs as soon as they burn out. Set up your furniture so you have a clear path. Avoid moving your furniture around. If any of your floors are uneven, fix them. If there are any pets around you, be aware of where they are. Review your medicines with your doctor. Some medicines can make you feel dizzy. This can increase your chance of falling. Ask your doctor what other things that you can do to help prevent falls. This information is not intended to replace advice given to you by your health care provider. Make sure you discuss any questions you have with your health care provider. Document Released: 10/24/2008 Document Revised: 06/05/2015 Document Reviewed: 02/01/2014 Elsevier Interactive Patient Education  2017 [employer].

## 2022-06-24 ENCOUNTER — Encounter: Payer: Self-pay | Admitting: Student in an Organized Health Care Education/Training Program

## 2022-07-12 DIAGNOSIS — G4733 Obstructive sleep apnea (adult) (pediatric): Secondary | ICD-10-CM | POA: Diagnosis not present

## 2022-07-12 DIAGNOSIS — G4730 Sleep apnea, unspecified: Secondary | ICD-10-CM | POA: Diagnosis not present

## 2022-07-12 DIAGNOSIS — J45998 Other asthma: Secondary | ICD-10-CM | POA: Diagnosis not present

## 2022-08-12 DIAGNOSIS — J45998 Other asthma: Secondary | ICD-10-CM | POA: Diagnosis not present

## 2022-08-28 DIAGNOSIS — G4730 Sleep apnea, unspecified: Secondary | ICD-10-CM | POA: Diagnosis not present

## 2022-08-28 DIAGNOSIS — G4733 Obstructive sleep apnea (adult) (pediatric): Secondary | ICD-10-CM | POA: Diagnosis not present

## 2022-09-06 ENCOUNTER — Ambulatory Visit (HOSPITAL_COMMUNITY)
Admission: RE | Admit: 2022-09-06 | Discharge: 2022-09-06 | Disposition: A | Discharge status: Home / Self Care | Payer: 59 | Source: Ambulatory Visit | Attending: Student in an Organized Health Care Education/Training Program | Admitting: Student in an Organized Health Care Education/Training Program

## 2022-09-06 ENCOUNTER — Encounter: Payer: Self-pay | Admitting: Student in an Organized Health Care Education/Training Program

## 2022-09-06 ENCOUNTER — Ambulatory Visit (INDEPENDENT_AMBULATORY_CARE_PROVIDER_SITE_OTHER): Payer: 59 | Admitting: Student in an Organized Health Care Education/Training Program

## 2022-09-06 VITALS — BP 140/84 | HR 70 | Temp 98.1°F | Ht 75.0 in | Wt >= 6400 oz

## 2022-09-06 DIAGNOSIS — M79671 Pain in right foot: Secondary | ICD-10-CM | POA: Insufficient documentation

## 2022-09-06 DIAGNOSIS — R600 Localized edema: Secondary | ICD-10-CM | POA: Diagnosis not present

## 2022-09-06 DIAGNOSIS — M7989 Other specified soft tissue disorders: Secondary | ICD-10-CM | POA: Diagnosis not present

## 2022-09-06 DIAGNOSIS — M19071 Primary osteoarthritis, right ankle and foot: Secondary | ICD-10-CM | POA: Diagnosis not present

## 2022-09-06 NOTE — Progress Notes (Signed)
   Established Patient Office Visit  Subjective   Patient ID: Eduardo Duncan, male    DOB: April 27, 1960  Age: 62 y.o. MRN: [id number]  Chief Complaint  Patient presents with   Foot Swelling   Ankle Pain   Back Pain    HPI  62 year old person here for an acute visit for pain in his right foot and ankle.  This has been going on since early July.  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  Did not twist his ankle.  No falls.  No other traumas to the foot.     Objective:     BP (!) 174/88 (BP Location: Right Arm, Patient Position: Sitting, Cuff Size: Large)   Pulse 73   Temp 98.1 F (36.7 C) (Oral)   Ht 6\' 3"  (1.905 m)   Wt (!) 413 lb 12.8 oz (187.7 kg)   SpO2 98%   BMI 51.72 kg/m    Physical Exam  Gen: Well-appearing, no distress CV: Distant heart sounds, regular rate and rhythm with no murmurs Lungs: Normal work of breathing, no wheezing Extremities: 2+ pitting edema in both lower extremities, he has some chronic stasis dermatitis, he has discomfort at the base of the fifth metatarsal.  He can bear weight on the foot normally.  Good range of motion of the ankle.  No swelling at the great toe.  Monofilament exam is normal at 10 points on the right foot. Neuro: Alert, conversational, full strength upper and lower extremities.    Assessment & Plan:   Problem List Items Addressed This Visit       Unprioritized   Right foot pain - Primary    Patient with pain in the right foot for about 2 months that has been waxing and waning.  Mostly localized around the base of the fifth metatarsal and the distal fibula.  Has had some imbalance which has exacerbated the discomfort in his foot.  He cannot remember having a specific fall or inversion injury of the ankle.  He reports a history of being told he has gout.  He has no pain at the great toe.  He has had increased swelling in both of his lower extremities, he does have  some redness of the skin.  He has no neuropathy, monofilament exam today was normal so I doubt this is charcot foot.  Plan will be to do x-rays today to rule out a Jones fracture or fibular fracture.  Will refer to podiatry as this might be a tendinitis related to his obesity.  Counseled on careful use of NSAIDs for supportive care.      Relevant Orders   DG Foot Complete Right   DG Ankle Complete Right   Ambulatory referral to Podiatry    Return in about 6 months (around 03/09/2023).    Tyson Alias, MD

## 2022-09-06 NOTE — Assessment & Plan Note (Signed)
Patient with pain in the right foot for about 2 months that has been waxing and waning.  Mostly localized around the base of the fifth metatarsal and the distal fibula.  Has had some imbalance which has exacerbated the discomfort in his foot.  He cannot remember having a specific fall or inversion injury of the ankle.  He reports a history of being told he has gout.  He has no pain at the great toe.  He has had increased swelling in both of his lower extremities, he does have some redness of the skin.  He has no neuropathy, monofilament exam today was normal so I doubt this is charcot foot.  Plan will be to do x-rays today to rule out a Jones fracture or fibular fracture.  Will refer to podiatry as this might be a tendinitis related to his obesity.  Counseled on careful use of NSAIDs for supportive care.

## 2022-09-10 DIAGNOSIS — G4730 Sleep apnea, unspecified: Secondary | ICD-10-CM | POA: Diagnosis not present

## 2022-09-10 DIAGNOSIS — G4733 Obstructive sleep apnea (adult) (pediatric): Secondary | ICD-10-CM | POA: Diagnosis not present

## 2022-09-12 DIAGNOSIS — J45998 Other asthma: Secondary | ICD-10-CM | POA: Diagnosis not present

## 2022-09-16 ENCOUNTER — Telehealth: Payer: Self-pay

## 2022-09-16 NOTE — Telephone Encounter (Signed)
Pt is requesting a call back ... It is about his referral ( Podiatry ) he stated that he has not heard  anything back about it .Marland KitchenMarland Kitchen

## 2022-09-29 ENCOUNTER — Ambulatory Visit (INDEPENDENT_AMBULATORY_CARE_PROVIDER_SITE_OTHER): Payer: 59 | Admitting: Allergy

## 2022-09-29 ENCOUNTER — Encounter: Payer: Self-pay | Admitting: Allergy

## 2022-09-29 VITALS — BP 140/90 | HR 74 | Temp 98.6°F | Resp 16 | Wt >= 6400 oz

## 2022-09-29 DIAGNOSIS — H1013 Acute atopic conjunctivitis, bilateral: Secondary | ICD-10-CM | POA: Diagnosis not present

## 2022-09-29 DIAGNOSIS — J4540 Moderate persistent asthma, uncomplicated: Secondary | ICD-10-CM

## 2022-09-29 DIAGNOSIS — J683 Other acute and subacute respiratory conditions due to chemicals, gases, fumes and vapors: Secondary | ICD-10-CM | POA: Diagnosis not present

## 2022-09-29 DIAGNOSIS — J3089 Other allergic rhinitis: Secondary | ICD-10-CM | POA: Diagnosis not present

## 2022-09-29 MED ORDER — BUDESONIDE 90 MCG/ACT IN AEPB: 1.0000 | INHALATION_SPRAY | Freq: Two times a day (BID) | RESPIRATORY_TRACT | 3 refills | Status: AC

## 2022-09-29 MED ORDER — LEVALBUTEROL TARTRATE 45 MCG/ACT IN AERO: 2.0000 | INHALATION_SPRAY | RESPIRATORY_TRACT | 1 refills | Status: DC | PRN
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
## 2022-09-29 NOTE — Patient Instructions (Addendum)
-   based on your history appears you have had RADS (reactive airway disease syndrome) events where exposures to large amounts of toxic/noxious chemicals can cause asthma symptoms - Asthma action plan medication(s)- if having flare of symptoms or respiratory illness:  Pulmicort (budesonide) 1-2 puffs twice a day for duration of symptoms - Rescue medications and if needed prior to physical activity:  AirSupra (when available) 2 puffs as needed every 4 hours OR Xopenex 2 puffs or Xopenex 1 vial via nebulizer 10-15 minutes before physical activity.  - Asthma control goals:  * Full participation in all desired activities (may need albuterol before activity) * Albuterol use two time or less a week on average (not counting use with activity) * Cough interfering with sleep two time or less a month * Oral steroids no more than once a year * No hospitalizations  - Continue avoidance measures for mold - Continue Zyrtec and Flonase for now.  You can rotate between Zyrtec and Xyzal every 6 months or so  - You can use an extra dose of the antihistamine, if needed, for breakthrough symptoms.  - Recommend nasal saline rinses 1-7 times daily to remove allergens from the nasal cavities as well as help with mucous clearance (this is especially helpful to do before the nasal sprays are given).  This can be done via navage machine.  Use distilled water.  Keep mouth open during rinse.   - Common food allergy testing was negative   Follow-up in 4-6 months for routine visit

## 2022-10-01 ENCOUNTER — Other Ambulatory Visit (HOSPITAL_COMMUNITY): Payer: Self-pay

## 2022-10-09 ENCOUNTER — Other Ambulatory Visit: Payer: Self-pay | Admitting: Student in an Organized Health Care Education/Training Program

## 2022-10-09 DIAGNOSIS — I10 Essential (primary) hypertension: Secondary | ICD-10-CM

## 2022-10-09 DIAGNOSIS — E785 Hyperlipidemia, unspecified: Secondary | ICD-10-CM

## 2022-10-12 DIAGNOSIS — G4730 Sleep apnea, unspecified: Secondary | ICD-10-CM | POA: Diagnosis not present

## 2022-10-12 DIAGNOSIS — G4733 Obstructive sleep apnea (adult) (pediatric): Secondary | ICD-10-CM | POA: Diagnosis not present

## 2022-10-12 DIAGNOSIS — J45998 Other asthma: Secondary | ICD-10-CM | POA: Diagnosis not present

## 2022-10-14 DIAGNOSIS — M79672 Pain in left foot: Secondary | ICD-10-CM | POA: Diagnosis not present

## 2022-10-14 DIAGNOSIS — I890 Lymphedema, not elsewhere classified: Secondary | ICD-10-CM | POA: Diagnosis not present

## 2022-10-14 DIAGNOSIS — M25571 Pain in right ankle and joints of right foot: Secondary | ICD-10-CM | POA: Diagnosis not present

## 2022-10-14 DIAGNOSIS — M79671 Pain in right foot: Secondary | ICD-10-CM | POA: Diagnosis not present

## 2022-11-12 DIAGNOSIS — J45998 Other asthma: Secondary | ICD-10-CM | POA: Diagnosis not present

## 2022-12-11 DIAGNOSIS — G4733 Obstructive sleep apnea (adult) (pediatric): Secondary | ICD-10-CM | POA: Diagnosis not present

## 2022-12-11 DIAGNOSIS — G4730 Sleep apnea, unspecified: Secondary | ICD-10-CM | POA: Diagnosis not present

## 2022-12-12 DIAGNOSIS — J45998 Other asthma: Secondary | ICD-10-CM | POA: Diagnosis not present

## 2022-12-24 DIAGNOSIS — G4730 Sleep apnea, unspecified: Secondary | ICD-10-CM | POA: Diagnosis not present

## 2022-12-24 DIAGNOSIS — G4733 Obstructive sleep apnea (adult) (pediatric): Secondary | ICD-10-CM | POA: Diagnosis not present

## 2022-12-31 DIAGNOSIS — I890 Lymphedema, not elsewhere classified: Secondary | ICD-10-CM | POA: Diagnosis not present

## 2022-12-31 DIAGNOSIS — M722 Plantar fascial fibromatosis: Secondary | ICD-10-CM | POA: Diagnosis not present

## 2023-01-04 ENCOUNTER — Other Ambulatory Visit: Payer: Self-pay | Admitting: Student in an Organized Health Care Education/Training Program

## 2023-01-04 DIAGNOSIS — E785 Hyperlipidemia, unspecified: Secondary | ICD-10-CM

## 2023-01-04 DIAGNOSIS — I10 Essential (primary) hypertension: Secondary | ICD-10-CM

## 2023-01-11 ENCOUNTER — Encounter: Payer: Self-pay | Admitting: *Deleted

## 2023-01-12 DIAGNOSIS — J45998 Other asthma: Secondary | ICD-10-CM | POA: Diagnosis not present

## 2023-02-02 ENCOUNTER — Ambulatory Visit: Payer: 59 | Admitting: Allergy

## 2023-02-12 DIAGNOSIS — G4733 Obstructive sleep apnea (adult) (pediatric): Secondary | ICD-10-CM | POA: Diagnosis not present

## 2023-02-12 DIAGNOSIS — J45998 Other asthma: Secondary | ICD-10-CM | POA: Diagnosis not present

## 2023-02-12 DIAGNOSIS — G4730 Sleep apnea, unspecified: Secondary | ICD-10-CM | POA: Diagnosis not present

## 2023-03-24 DIAGNOSIS — G4733 Obstructive sleep apnea (adult) (pediatric): Secondary | ICD-10-CM | POA: Diagnosis not present

## 2023-03-24 DIAGNOSIS — G4730 Sleep apnea, unspecified: Secondary | ICD-10-CM | POA: Diagnosis not present

## 2023-04-01 DIAGNOSIS — G4733 Obstructive sleep apnea (adult) (pediatric): Secondary | ICD-10-CM | POA: Diagnosis not present

## 2023-04-03 ENCOUNTER — Other Ambulatory Visit: Payer: Self-pay | Admitting: Student in an Organized Health Care Education/Training Program

## 2023-04-03 DIAGNOSIS — I10 Essential (primary) hypertension: Secondary | ICD-10-CM

## 2023-04-03 DIAGNOSIS — E785 Hyperlipidemia, unspecified: Secondary | ICD-10-CM

## 2023-04-04 NOTE — Telephone Encounter (Signed)
 NO LONGER [facility] PATIENT

## 2023-04-05 ENCOUNTER — Other Ambulatory Visit: Payer: Self-pay | Admitting: Student in an Organized Health Care Education/Training Program

## 2023-04-05 DIAGNOSIS — E785 Hyperlipidemia, unspecified: Secondary | ICD-10-CM

## 2023-04-06 ENCOUNTER — Telehealth: Payer: Self-pay

## 2023-04-06 DIAGNOSIS — I10 Essential (primary) hypertension: Secondary | ICD-10-CM

## 2023-04-06 DIAGNOSIS — E785 Hyperlipidemia, unspecified: Secondary | ICD-10-CM

## 2023-04-06 MED ORDER — CARVEDILOL 12.5 MG PO TABS
12.5000 mg | ORAL_TABLET | Freq: Two times a day (BID) | ORAL | 0 refills | Status: DC
Start: 2023-04-06 — End: 2023-07-04

## 2023-04-06 MED ORDER — VALSARTAN 160 MG PO TABS: 160.0000 mg | ORAL_TABLET | Freq: Every day | ORAL | 0 refills | Status: DC

## 2023-04-06 MED ORDER — SIMVASTATIN 40 MG PO TABS
40.0000 mg | ORAL_TABLET | Freq: Every day | ORAL | 0 refills | Status: DC
Start: 2023-04-06 — End: 2023-07-04

## 2023-04-06 NOTE — Telephone Encounter (Signed)
 NOT [facility] PATIENT

## 2023-04-06 NOTE — Telephone Encounter (Signed)
 Copied from CRM [id number]. Topic: Clinical - Prescription Issue >> Apr 06, 2023  9:27 AM Eduardo Duncan wrote: Reason for CRM: carvedilol (COREG) 12.5 MG tablet  valsartan (DIOVAN) 160 MG tablet  simvastatin (ZOCOR) 40 MG tablet   Patient stated that the pharmacy informed him that his refill request was denied. He is a patient of Dr. Darel Hong, who recently transferred from [facility] to [facility]. Uncertain if the provider change is causing the issue.

## 2023-04-06 NOTE — Telephone Encounter (Signed)
Yes, refilled.  Thanks. 

## 2023-04-06 NOTE — Telephone Encounter (Signed)
 Simvastatin refilled earlier today.

## 2023-04-06 NOTE — Telephone Encounter (Signed)
 Called patient to let him know medication has been sent in.

## 2023-04-14 ENCOUNTER — Encounter: Payer: Self-pay | Admitting: Allergy

## 2023-04-14 ENCOUNTER — Other Ambulatory Visit: Payer: Self-pay

## 2023-04-14 ENCOUNTER — Ambulatory Visit (INDEPENDENT_AMBULATORY_CARE_PROVIDER_SITE_OTHER): Payer: 59 | Admitting: Allergy

## 2023-04-14 VITALS — BP 124/86 | HR 76 | Temp 98.6°F | Ht 75.0 in | Wt >= 6400 oz

## 2023-04-14 DIAGNOSIS — J4540 Moderate persistent asthma, uncomplicated: Secondary | ICD-10-CM

## 2023-04-14 DIAGNOSIS — J3089 Other allergic rhinitis: Secondary | ICD-10-CM | POA: Diagnosis not present

## 2023-04-14 DIAGNOSIS — H1013 Acute atopic conjunctivitis, bilateral: Secondary | ICD-10-CM

## 2023-04-14 DIAGNOSIS — J683 Other acute and subacute respiratory conditions due to chemicals, gases, fumes and vapors: Secondary | ICD-10-CM | POA: Diagnosis not present

## 2023-04-14 MED ORDER — AIRSUPRA 90-80 MCG/ACT IN AERO: 2.0000 | INHALATION_SPRAY | RESPIRATORY_TRACT | 1 refills | Status: AC | PRN

## 2023-04-14 MED ORDER — LEVALBUTEROL TARTRATE 45 MCG/ACT IN AERO: 2.0000 | INHALATION_SPRAY | RESPIRATORY_TRACT | 1 refills | Status: AC | PRN

## 2023-04-14 MED ORDER — LEVALBUTEROL HCL 0.63 MG/3ML IN NEBU: 0.6300 mg | INHALATION_SOLUTION | Freq: Four times a day (QID) | RESPIRATORY_TRACT | 1 refills | Status: DC | PRN

## 2023-04-14 NOTE — Progress Notes (Signed)
 Follow-up Note  RE: Eduardo Duncan MRN: [id number] DOB: 09-03-1960 Date of Office Visit: 04/14/2023   History of present illness: Eduardo Duncan is a 63 y.o. male presenting today for follow-up of asthma/RADS, allergic rhinitis with conjunctivitis.  He was last seen in the office on 09/29/2022 by myself. Discussed the use of AI scribe software for clinical note transcription with the patient, who gave verbal consent to proceed.  He experiences intermittent shortness of breath and uses Airsupra and Xopenex for management. Paulene Floor provides longer relief but causes anxiety, while Xopenex is used more frequently and increases his heart rate by ten to twelve beats per minute. He has been on CPAP for sixteen years and reports chronic sinus and lung issues, with coughing up mucus. He maintains his CPAP equipment regularly, replacing it every ninety days and using a disinfecting machine.  He uses Zyrtec and Flonase daily, which effectively relieve nasal symptoms, with Zyrtec opening his nose within an hour. He has considered switching to Nasonex and Xyzal if the current medications become less effective. He has a history of exposure to mold in his home, which he believes contributes to his respiratory issues. He cleans with bleach and ventilates the area but acknowledges the mold is pervasive.   He reports significant pain in his low back, left hip, and the back of his thigh, describing it as 'in the bones'. He denies any specific injury or unusual activity causing the pain. He has a history of over sixty fractures and broken bones, attributed to extensive use of his body over the years.  He is going to be seeing his PCP regarding this new pain.  Review of systems: 10pt ROS negative unless noted above in HPI   Past medical/social/surgical/family history have been reviewed and are unchanged unless specifically indicated below.  No changes  Medication List: Current Outpatient Medications   Medication Sig Dispense Refill   acetaminophen (TYLENOL) 500 MG tablet Take 500 mg by mouth every 6 (six) hours as needed.     AIRSUPRA 90-80 MCG/ACT AERO Inhale 2 puffs into the lungs every 4 (four) hours as needed. 10.7 g 3   albuterol (VENTOLIN HFA) 108 (90 Base) MCG/ACT inhaler Inhale 2 puffs into the lungs every 4 (four) hours as needed.     aspirin EC 81 MG tablet Take 81 mg by mouth daily. Swallow whole.     Budesonide 90 MCG/ACT inhaler Inhale 1-2 puffs into the lungs 2 (two) times daily. 1 each 3   carvedilol (COREG) 12.5 MG tablet Take 1 tablet (12.5 mg total) by mouth 2 (two) times daily with a meal. 180 tablet 0   cetirizine (ZYRTEC) 10 MG tablet Take 10 mg by mouth daily.     famotidine (PEPCID) 20 MG tablet Take 1/2 (one-half) tablet by mouth twice daily 90 tablet 0   fluticasone (FLONASE) 50 MCG/ACT nasal spray Place 1 spray into both nostrils daily.     ibuprofen (ADVIL,MOTRIN) 200 MG tablet Take 600 mg by mouth 3 (three) times daily as needed.      levalbuterol (XOPENEX HFA) 45 MCG/ACT inhaler Inhale 2 puffs into the lungs every 4 (four) hours as needed for wheezing. 15 g 1   levalbuterol (XOPENEX) 0.63 MG/3ML nebulizer solution Take 3 mLs (0.63 mg total) by nebulization 4 (four) times daily as needed for wheezing or shortness of breath. 3 mL 2   simvastatin (ZOCOR) 40 MG tablet Take 1 tablet (40 mg total) by mouth at bedtime. 90 tablet 0  valsartan (DIOVAN) 160 MG tablet Take 1 tablet (160 mg total) by mouth daily. 90 tablet 0   AIRSUPRA 90-80 MCG/ACT AERO INHALE 1 PUFF ONCE DAILY MAY  INCREASE  TO  2  PUFFS  ONCE  A  DAY  AS  NEEDED (Patient not taking: Reported on 04/14/2023) 11 g 2   ipratropium (ATROVENT) 0.03 % nasal spray Place 2 sprays into both nostrils.     No current facility-administered medications for this visit.     Known medication allergies: Allergies  Allergen Reactions   Ace Inhibitors Cough   Advair Diskus [Fluticasone-Salmeterol] Other (See Comments)     Confusion and paranoid   Codeine Nausea Only    REACTION: nausea   Gabapentin Other (See Comments)    Facial and left shoulder numbness   Montelukast Sodium Other (See Comments)    Confusion and paranoid   Prednisone Other (See Comments)    Confusion and paranoid   Prednisolone Anxiety     Physical examination: Blood pressure 124/86, pulse 76, temperature 98.6 F (37 C), temperature source Temporal, height 6\' 3"  (1.905 m), weight (!) 415 lb 6.4 oz (188.4 kg), SpO2 95%.  General: Alert, interactive, in no acute distress. HEENT: PERRLA, TMs pearly gray, turbinates minimally edematous without discharge, post-pharynx non erythematous. Neck: Supple without lymphadenopathy. Lungs: Clear to auscultation without wheezing, rhonchi or rales. {no increased work of breathing. CV: Normal S1, S2 without murmurs. Abdomen: Nondistended, nontender. Skin: Warm and dry, without lesions or rashes. Extremities:  No clubbing, cyanosis or edema. Neuro:   Grossly intact.  Diagnositics/Labs:  Spirometry: FEV1: 2.0L 50%, FVC: 3.23L 61%, ratio consistent with obstructive pattern.  This is quite stable for him  Assessment and plan: Moderate persistent asthma/RADS   - based on your history appears you have had RADS (reactive airway disease syndrome) events where exposures to large amounts of toxic/noxious chemicals can cause asthma symptoms - Rescue medications and if needed prior to physical activity:  AirSupra (if having respiratory illness or flare-up) 2 puffs as needed every 4 hours otherwise use Xopenex 2 puffs or Xopenex 1 vial via nebulizer 10-15 minutes before physical activity.  - Asthma control goals:  * Full participation in all desired activities (may need albuterol before activity) * Albuterol use two time or less a week on average (not counting use with activity) * Cough interfering with sleep two time or less a month * Oral steroids no more than once a year * No  hospitalizations  Allergic rhinitis with conjunctivitis - Continue avoidance measures for mold - Continue Zyrtec and Flonase for now.  If you are noting less benefit after use then would change to Xyzal and Nasonex. - You can use an extra dose of the antihistamine, if needed, for breakthrough symptoms.  - Recommend nasal saline rinses with NAVAGE machine 1-7 times a week to remove allergens from the nasal cavities as well as help with mucous clearance (this is especially helpful to do before the nasal sprays are given).    - Common food allergy testing was negative   Follow-up in 6 months for routine visit  I appreciate the opportunity to take part in Seiji's care. Please do not hesitate to contact me with questions.  Sincerely,   Margo Aye, MD Allergy/Immunology [facility]

## 2023-04-14 NOTE — Patient Instructions (Addendum)
-   based on your history appears you have had RADS (reactive airway disease syndrome) events where exposures to large amounts of toxic/noxious chemicals can cause asthma symptoms - Rescue medications and if needed prior to physical activity:  AirSupra (if having respiratory illness or flare-up) 2 puffs as needed every 4 hours otherwise use Xopenex 2 puffs or Xopenex 1 vial via nebulizer 10-15 minutes before physical activity.  - Asthma control goals:  * Full participation in all desired activities (may need albuterol before activity) * Albuterol use two time or less a week on average (not counting use with activity) * Cough interfering with sleep two time or less a month * Oral steroids no more than once a year * No hospitalizations  - Continue avoidance measures for mold - Continue Zyrtec and Flonase for now.  If you are noting less benefit after use then would change to Xyzal and Nasonex. - You can use an extra dose of the antihistamine, if needed, for breakthrough symptoms.  - Recommend nasal saline rinses with NAVAGE machine 1-7 times a week to remove allergens from the nasal cavities as well as help with mucous clearance (this is especially helpful to do before the nasal sprays are given).    - Common food allergy testing was negative   Follow-up in 6 months for routine visit

## 2023-04-15 NOTE — Addendum Note (Signed)
 Addended by: Glena Norfolk on: 04/15/2023 03:46 PM   Modules accepted: Orders

## 2023-04-22 ENCOUNTER — Ambulatory Visit (INDEPENDENT_AMBULATORY_CARE_PROVIDER_SITE_OTHER): Admitting: Student in an Organized Health Care Education/Training Program

## 2023-04-22 DIAGNOSIS — I10 Essential (primary) hypertension: Secondary | ICD-10-CM | POA: Diagnosis not present

## 2023-04-22 DIAGNOSIS — Z1211 Encounter for screening for malignant neoplasm of colon: Secondary | ICD-10-CM | POA: Diagnosis not present

## 2023-04-22 DIAGNOSIS — J45909 Unspecified asthma, uncomplicated: Secondary | ICD-10-CM

## 2023-04-22 DIAGNOSIS — Z131 Encounter for screening for diabetes mellitus: Secondary | ICD-10-CM | POA: Diagnosis not present

## 2023-04-22 NOTE — Assessment & Plan Note (Signed)
 Blood pressure under okay control today.  Will plan to continue valsartan and carvedilol.  Check BMP today.

## 2023-04-22 NOTE — Assessment & Plan Note (Signed)
 Comanaged with allergy and immunology.  Doing well through this pollen season.  We talked about the use of Airsupra for as needed anti-inflammatory reliever therapy.

## 2023-04-22 NOTE — Addendum Note (Signed)
 Addended by: Erlinda Hong T on: 04/22/2023 02:37 PM   Modules accepted: Orders

## 2023-04-22 NOTE — Progress Notes (Addendum)
   Established Patient Office Visit  Subjective   Patient ID: Eduardo Duncan, male    DOB: 12-10-1960  Age: 63 y.o. MRN: [id number]  Chief Complaint  Patient presents with   Transitions Of Care    Patient states he has been having some hip pain and would like to know if this can be a physical     HPI  63 year old person here for management of hypertension, obesity, and asthma.  Doing pretty well since I last saw him.  Still pretty isolated at home, not able to drive because lacking transportation and also because of neuropathy in his feet.  Has help from his sister and getting groceries.  Denies any chest pain or shortness of breath.  Tolerating his medications well, no side effects.  Denies depressed mood.  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    Objective:     BP (!) 140/80   Pulse 72   Wt (!) 412 lb (186.9 kg)   SpO2 95%   BMI 51.50 kg/m    Physical Exam  General: Severe obesity Neck: No adenopathy Heart: Regular Lungs: Unlabored, clear but distant sounds Abdomen: Soft, nontender Extremities: Chronic lower extremity edema is nonpitting    Assessment & Plan:   Problem List Items Addressed This Visit       High   Morbid obesity (HCC) - Primary (Chronic)   Chronic severe problem.  Weight today is 412 pounds with a BMI of 51.5.  He has numerous complications of obesity including sleep apnea, diffuse osteoarthritis, hypertension, and dyslipidemia.  He has gained 40 pounds over the last 2 years despite lifestyle modifications.  I have recommended medication assisted treatment, I think Zepbound would be a good choice given his comorbid sleep apnea.  He is going to consider this.      Relevant Orders   Basic Metabolic Panel with GFR   Hemoglobin A1c   Lipid panel   Essential hypertension (Chronic)   Blood pressure under okay control today.  Will plan to continue valsartan and carvedilol.  Check BMP today.        Medium    Asthma  (Chronic)   Comanaged with allergy and immunology.  Doing well through this pollen season.  We talked about the use of Airsupra for as needed anti-inflammatory reliever therapy.      Other Visit Diagnoses       Screening for colon cancer       Relevant Orders   Cologuard       Return in 3 months (on 07/22/2023).    Tyson Alias, MD

## 2023-04-22 NOTE — Assessment & Plan Note (Signed)
 Chronic severe problem.  Weight today is 412 pounds with a BMI of 51.5.  He has numerous complications of obesity including sleep apnea, diffuse osteoarthritis, hypertension, and dyslipidemia.  He has gained 40 pounds over the last 2 years despite lifestyle modifications.  I have recommended medication assisted treatment, I think Zepbound would be a good choice given his comorbid sleep apnea.  He is going to consider this.

## 2023-04-22 NOTE — Addendum Note (Signed)
 Addended by: Erlinda Hong T on: 04/22/2023 02:46 PM   Modules accepted: Level of Service

## 2023-04-22 NOTE — Patient Instructions (Signed)
 Fall Prevention in the Home, Adult Falls can cause injuries and can happen to people of all ages. There are many things you can do to make your home safer and to help prevent falls. What actions can I take to prevent falls? General information Use good lighting in all rooms. Make sure to: Replace any light bulbs that burn out. Turn on the lights in dark areas and use night-lights. Keep items that you use often in easy-to-reach places. Lower the shelves around your home if needed. Move furniture so that there are clear paths around it. Do not use throw rugs or other things on the floor that can make you trip. If any of your floors are uneven, fix them. Add color or contrast paint or tape to clearly mark and help you see: Grab bars or handrails. First and last steps of staircases. Where the edge of each step is. If you use a ladder or stepladder: Make sure that it is fully opened. Do not climb a closed ladder. Make sure the sides of the ladder are locked in place. Have someone hold the ladder while you use it. Know where your pets are as you move through your home. What can I do in the bathroom?     Keep the floor dry. Clean up any water on the floor right away. Remove soap buildup in the bathtub or shower. Buildup makes bathtubs and showers slippery. Use non-skid mats or decals on the floor of the bathtub or shower. Attach bath mats securely with double-sided, non-slip rug tape. If you need to sit down in the shower, use a non-slip stool. Install grab bars by the toilet and in the bathtub and shower. Do not use towel bars as grab bars. What can I do in the bedroom? Make sure that you have a light by your bed that is easy to reach. Do not use any sheets or blankets on your bed that hang to the floor. Have a firm chair or bench with side arms that you can use for support when you get dressed. What can I do in the kitchen? Clean up any spills right away. If you need to reach something  above you, use a step stool with a grab bar. Keep electrical cords out of the way. Do not use floor polish or wax that makes floors slippery. What can I do with my stairs? Do not leave anything on the stairs. Make sure that you have a light switch at the top and the bottom of the stairs. Make sure that there are handrails on both sides of the stairs. Fix handrails that are broken or loose. Install non-slip stair treads on all your stairs if they do not have carpet. Avoid having throw rugs at the top or bottom of the stairs. Choose a carpet that does not hide the edge of the steps on the stairs. Make sure that the carpet is firmly attached to the stairs. Fix carpet that is loose or worn. What can I do on the outside of my home? Use bright outdoor lighting. Fix the edges of walkways and driveways and fix any cracks. Clear paths of anything that can make you trip, such as tools or rocks. Add color or contrast paint or tape to clearly mark and help you see anything that might make you trip as you walk through a door, such as a raised step or threshold. Trim any bushes or trees on paths to your home. Check to see if handrails are loose  or broken and that both sides of all steps have handrails. Install guardrails along the edges of any raised decks and porches. Have leaves, snow, or ice cleared regularly. Use sand, salt, or ice melter on paths if you live where there is ice and snow during the winter. Clean up any spills in your garage right away. This includes grease or oil spills. What other actions can I take? Review your medicines with your doctor. Some medicines can cause dizziness or changes in blood pressure, which increase your risk of falling. Wear shoes that: Have a low heel. Do not wear high heels. Have rubber bottoms and are closed at the toe. Feel good on your feet and fit well. Use tools that help you move around if needed. These include: Canes. Walkers. Scooters. Crutches. Ask  your doctor what else you can do to help prevent falls. This may include seeing a physical therapist to learn to do exercises to move better and get stronger. Where to find more information Centers for Disease Control and Prevention, STEADI: TonerPromos.no [employer] on Aging: BaseRingTones.pl National Institute on Aging: BaseRingTones.pl Contact a doctor if: You are afraid of falling at home. You feel weak, drowsy, or dizzy at home. You fall at home. Get help right away if you: Lose consciousness or have trouble moving after a fall. Have a fall that causes a head injury. These symptoms may be an emergency. Get help right away. Call 911. Do not wait to see if the symptoms will go away. Do not drive yourself to the hospital. This information is not intended to replace advice given to you by your health care provider. Make sure you discuss any questions you have with your health care provider. Document Revised: 08/31/2021 Document Reviewed: 08/31/2021 Elsevier Patient Education  2024 [employer].

## 2023-04-23 LAB — HEMOGLOBIN A1C
Hgb A1c MFr Bld: 6.2 %{Hb} — ABNORMAL HIGH (ref <5.7)
Mean Plasma Glucose: 131 mg/dL
eAG (mmol/L): 7.3 mmol/L

## 2023-04-23 LAB — LIPID PANEL
Cholesterol: 154 mg/dL (ref <200)
HDL: 41 mg/dL (ref >=40)
LDL Cholesterol (Calc): 86 mg/dL
Non-HDL Cholesterol (Calc): 113 mg/dL (ref <130)
Total CHOL/HDL Ratio: 3.8 (calc) (ref <5.0)
Triglycerides: 168 mg/dL — ABNORMAL HIGH (ref <150)

## 2023-04-23 LAB — BASIC METABOLIC PANEL WITHOUT GFR
BUN: 14 mg/dL (ref 7–25)
CO2: 25 mmol/L (ref 20–32)
Calcium: 9.1 mg/dL (ref 8.6–10.3)
Chloride: 104 mmol/L (ref 98–110)
Creat: 0.82 mg/dL (ref 0.70–1.35)
Glucose, Bld: 106 mg/dL — ABNORMAL HIGH (ref 65–99)
Potassium: 3.9 mmol/L (ref 3.5–5.3)
Sodium: 138 mmol/L (ref 135–146)

## 2023-04-25 ENCOUNTER — Encounter: Payer: Self-pay | Admitting: Student in an Organized Health Care Education/Training Program

## 2023-05-12 DIAGNOSIS — G4733 Obstructive sleep apnea (adult) (pediatric): Secondary | ICD-10-CM | POA: Diagnosis not present

## 2023-07-01 ENCOUNTER — Other Ambulatory Visit: Payer: Self-pay | Admitting: Student in an Organized Health Care Education/Training Program

## 2023-07-01 DIAGNOSIS — I10 Essential (primary) hypertension: Secondary | ICD-10-CM

## 2023-07-01 DIAGNOSIS — E785 Hyperlipidemia, unspecified: Secondary | ICD-10-CM

## 2023-07-02 DIAGNOSIS — G4733 Obstructive sleep apnea (adult) (pediatric): Secondary | ICD-10-CM | POA: Diagnosis not present

## 2023-07-04 MED ORDER — SIMVASTATIN 40 MG PO TABS
40.0000 mg | ORAL_TABLET | Freq: Every day | ORAL | 3 refills | Status: AC
Start: 2023-07-04 — End: ?

## 2023-07-04 MED ORDER — CARVEDILOL 12.5 MG PO TABS
12.5000 mg | ORAL_TABLET | Freq: Two times a day (BID) | ORAL | 3 refills | Status: AC
Start: 2023-07-04 — End: ?

## 2023-07-04 MED ORDER — VALSARTAN 160 MG PO TABS: 160.0000 mg | ORAL_TABLET | Freq: Every day | ORAL | 3 refills | Status: AC

## 2023-07-04 NOTE — Telephone Encounter (Signed)
 Carvedilol , simvastatin , and valsartan  all refilled with 90-day supplies.

## 2023-07-04 NOTE — Telephone Encounter (Signed)
 NO LONGER [facility] PATIENT OR PROVIDER

## 2023-07-04 NOTE — Telephone Encounter (Signed)
 Patient called to follow up on this because he was confused why it was denied but it was sent to Dr Vincent's previous office. Patient needs refill of meds below   Carvedilol  12.5 mg Oral 2 times daily with meals Valsartan  160 mg Oral Daily Simvastatin  40 mg Oral Daily at bedtime

## 2023-07-04 NOTE — Telephone Encounter (Signed)
 Refills needed.

## 2023-07-05 DIAGNOSIS — Z1211 Encounter for screening for malignant neoplasm of colon: Secondary | ICD-10-CM | POA: Diagnosis not present

## 2023-07-09 LAB — COLOGUARD: COLOGUARD: NEGATIVE

## 2023-07-11 ENCOUNTER — Ambulatory Visit: Payer: Self-pay | Admitting: Student in an Organized Health Care Education/Training Program

## 2023-07-12 ENCOUNTER — Telehealth: Payer: Self-pay | Admitting: Student in an Organized Health Care Education/Training Program

## 2023-07-12 NOTE — Telephone Encounter (Signed)
 Type of form received: Orders  Additional comments:   Received by: Fax  Form should be Faxed/mailed to: (address/ fax #) [phone]  Is patient requesting call for pickup: N/A  Form placed:  Labeled & placed in provider bin  Attach charge sheet.  Provider will determine charge.  Individual made aware of 3-5 business day turn around? N/A

## 2023-07-12 NOTE — Telephone Encounter (Signed)
 Forms have been faxed

## 2023-07-12 NOTE — Telephone Encounter (Signed)
 Forms have been placed with provider.

## 2023-07-22 ENCOUNTER — Encounter: Payer: Self-pay | Admitting: Student in an Organized Health Care Education/Training Program

## 2023-07-22 ENCOUNTER — Ambulatory Visit (INDEPENDENT_AMBULATORY_CARE_PROVIDER_SITE_OTHER): Admitting: Student in an Organized Health Care Education/Training Program

## 2023-07-22 DIAGNOSIS — Z1283 Encounter for screening for malignant neoplasm of skin: Secondary | ICD-10-CM | POA: Insufficient documentation

## 2023-07-22 DIAGNOSIS — I10 Essential (primary) hypertension: Secondary | ICD-10-CM

## 2023-07-22 DIAGNOSIS — Z6841 Body Mass Index (BMI) 40.0 and over, adult: Secondary | ICD-10-CM

## 2023-07-22 DIAGNOSIS — Z0000 Encounter for general adult medical examination without abnormal findings: Secondary | ICD-10-CM

## 2023-07-22 MED ORDER — IBUPROFEN 200 MG PO TABS: 400.0000 mg | ORAL_TABLET | Freq: Two times a day (BID) | ORAL | 1 refills | Status: AC | PRN

## 2023-07-22 NOTE — Assessment & Plan Note (Signed)
 Normal colon cancer screening, A1c indicates prediabetes, family history of diabetes. - Recheck A1c every 6 to 12 months.

## 2023-07-22 NOTE — Assessment & Plan Note (Signed)
 Multiple benign lesions, there is a spots that could be basal cell carcinoma causing recurrent bleeding on his scalp. - Refer to dermatology for evaluation and potential removal of lesions.

## 2023-07-22 NOTE — Patient Instructions (Signed)
  VISIT SUMMARY: Today, you came in for a follow-up visit to discuss your skin lesions and weight management. We also reviewed your hypertension, asthma, and chronic pain issues.  YOUR PLAN: -RIGHT FOOT PAIN: You have chronic pain in your right foot, which is made worse by activities that put weight on it. This is likely due to your history of a stress fracture and sports injuries. We recommend wearing supportive footwear like tactical boots to help manage the pain.  -CHRONIC PAIN MANAGEMENT: You have chronic pain in your upper back and have been using a lot of over-the-counter pain medications. We discussed the benefits of prescription ibuprofen , and I have prescribed it for you to help manage your pain more effectively.  -SEBORRHEIC KERATOSIS: You have multiple benign skin lesions, but there is a concern that one on your scalp might be a type of skin cancer called basal cell carcinoma. I am referring you to a dermatologist for further evaluation and possible removal of these lesions.  -OBESITY: Your BMI is 51, and you have gained weight recently. Obesity can lead to various health issues. We discussed the importance of physical activity and dietary changes. If you are interested, we can also talk about weight loss medications.  -HYPERTENSION: Your high blood pressure is well-controlled with valsartan . Although you prefer lisinopril, valsartan  is recommended due to fewer side effects. Continue taking valsartan  as prescribed.  -CHRONIC STASIS DERMATITIS: You experience intermittent leg swelling, which is worse after sitting for long periods. This condition is known as chronic stasis dermatitis. Consider using compression stockings to help manage the swelling.  -ASTHMA: Your asthma symptoms are well-controlled, but you have occasional flare-ups when using central air conditioning. We recommend continuing to use the window air conditioning unit. Follow up with your allergist in October.  -GENERAL  HEALTH MAINTENANCE: Your routine colon cancer screening was normal. Your A1c test indicates prediabetes, and you have a family history of diabetes. We will recheck your A1c every 6 to 12 months.  INSTRUCTIONS: Please schedule a follow-up appointment in 6 months. Additionally, follow up with your allergist in October.

## 2023-07-22 NOTE — Progress Notes (Signed)
 Established Patient Office Visit  Subjective   Patient ID: Eduardo Duncan, male    DOB: [birth year]  Age: 63 y.o. MRN: [id number]  Chief Complaint  Patient presents with   Follow-up    48mo; have some spots on back and on top of head that would like to get looked at; no itching or pain just noticed; believe they've come up in the last 56mo-48mo    HPI  Discussed the use of AI scribe software for clinical note transcription with the patient, who gave verbal consent to proceed.  History of Present Illness Eduardo Duncan is a 63 year old male with hypertension and asthma who presents for follow-up regarding skin lesions and weight management.  He has a hard spot on the top of his head, which he thinks is like a wart because it is pretty hard, and several spots on his back that have appeared over the last year or two. He has a history of significant sun exposure from working as a Public relations account executive and in [employer]. His sister and nieces have had similar skin issues.  He has gained ten pounds over the last ninety days despite efforts to manage his diet and increase physical activity by walking laps around his yard. He experiences ankle instability and has stumbled several times due to a soft yard with mole activity. His weight is currently 415 pounds with a BMI of 51.  He is currently using valsartan  for blood pressure management but prefers lisinopril, which he used successfully for several years.  Lisinopril was discontinued many years ago because of chronic ACE inhibitor induced coughing.  His blood pressure readings at home are typically below 135/85 mmHg. He experiences intermittent leg swelling, particularly after prolonged sitting.  He has asthma and allergies, with recent improvement in symptoms when using a window air conditioning unit instead of central air, which he suspects may have mold issues. He has not needed albuterol  for two weeks when using the window unit. He reports a persistent  wet cough, especially when using a CPAP machine, and occasional blood in mucus.  He has a history of a stress fracture in his foot and reports significant pain when pivoting on the foot. He has a history of multiple foot and ankle injuries from sports and activities such as rodeo and motocross. He has been taking high doses of over-the-counter ibuprofen  and Tylenol for pain management, particularly for severe upper back pain that lasted ten days and limited his arm movement. He reports taking up to twelve ibuprofen  and six Tylenol daily to manage pain.      Objective:     BP (!) 152/77   Pulse 66   Temp 98 F (36.7 C)   Ht 6' 3 (1.905 m)   Wt (!) 415 lb (188.2 kg)   SpO2 96%   BMI 51.87 kg/m    Physical Exam  Gen: Well-appearing Skin: On the upper left scalp there is a 5 mm raised nodule with a rough surface, no pigment, looks like either a seborrheic keratosis or perhaps a basal cell carcinoma.  She had several other SKs on his face and back.  On his shoulders bilaterally he has diffuse actinic keratoses. Heart: Distant heart sounds, no murmur Lungs: Unlabored, clear, distant sounds, no wheezing Ext; warm, 1+ pitting edema bilaterally, right leg worse than left, chronic stasis changes,    Assessment & Plan:   Problem List Items Addressed This Visit       High   Morbid  obesity (HCC) - Primary (Chronic)   BMI of 51 with recent weight gain. Concerns about medication side effects. - Encourage physical activity and dietary modifications. - Discussed weight loss medications.  Patient not interested at this time.      Essential hypertension (Chronic)   Managed with valsartan , well-controlled. Preference for lisinopril but ARBs preferred due to side effects.  Also intolerant to amlodipine and thiazides in the past.  Blood pressures at home seem to be much better controlled than they are here in the clinic. - Continue valsartan .        Low   Healthcare maintenance (Chronic)    Normal colon cancer screening, A1c indicates prediabetes, family history of diabetes. - Recheck A1c every 6 to 12 months.      Skin cancer screening (Chronic)   Multiple benign lesions, there is a spots that could be basal cell carcinoma causing recurrent bleeding on his scalp. - Refer to dermatology for evaluation and potential removal of lesions.      Relevant Orders   Ambulatory referral to Dermatology    Return in about 6 months (around 01/22/2024).    Cleatus Debby Specking, MD

## 2023-07-22 NOTE — Assessment & Plan Note (Signed)
 BMI of 51 with recent weight gain. Concerns about medication side effects. - Encourage physical activity and dietary modifications. - Discussed weight loss medications.  Patient not interested at this time.

## 2023-07-22 NOTE — Assessment & Plan Note (Addendum)
 Managed with valsartan , well-controlled. Preference for lisinopril but ARBs preferred due to side effects.  Also intolerant to amlodipine and thiazides in the past.  Blood pressures at home seem to be much better controlled than they are here in the clinic. - Continue valsartan .

## 2023-07-29 ENCOUNTER — Encounter: Payer: Self-pay | Admitting: Advanced Practice Midwife

## 2023-10-14 ENCOUNTER — Encounter: Payer: Self-pay | Admitting: Allergy

## 2023-10-14 ENCOUNTER — Other Ambulatory Visit: Payer: Self-pay

## 2023-10-14 ENCOUNTER — Ambulatory Visit (INDEPENDENT_AMBULATORY_CARE_PROVIDER_SITE_OTHER): Admitting: Allergy

## 2023-10-14 VITALS — HR 73 | Temp 98.1°F

## 2023-10-14 DIAGNOSIS — J3089 Other allergic rhinitis: Secondary | ICD-10-CM

## 2023-10-14 DIAGNOSIS — J683 Other acute and subacute respiratory conditions due to chemicals, gases, fumes and vapors: Secondary | ICD-10-CM | POA: Diagnosis not present

## 2023-10-14 DIAGNOSIS — H1013 Acute atopic conjunctivitis, bilateral: Secondary | ICD-10-CM | POA: Diagnosis not present

## 2023-10-14 DIAGNOSIS — J4540 Moderate persistent asthma, uncomplicated: Secondary | ICD-10-CM

## 2023-10-14 MED ORDER — LEVALBUTEROL HCL 0.63 MG/3ML IN NEBU: 0.6300 mg | INHALATION_SOLUTION | Freq: Four times a day (QID) | RESPIRATORY_TRACT | 1 refills | Status: AC | PRN

## 2023-10-14 NOTE — Progress Notes (Signed)
 Follow-up Note  RE: Eduardo Duncan MRN: [id number] DOB: Sep 11, 1960 Date of Office Visit: 10/14/2023   History of present illness: Eduardo Duncan is a 63 y.o. male presenting today for follow-up of RADS and allergic rhinoconjunctivitis.  He was last seen in the office on 04/14/2023 by myself. Discussed the use of AI scribe software for clinical note transcription with the patient, who gave verbal consent to proceed.  He has been experiencing swelling and fluid retention since going back to use of his CPAP. He sleeps in a recliner due to discomfort in bed, attributed to spinal problems and states when he sleeps upright he does not need to use the CPAP. Recently, he got a memory foam topper in bed and decided to try this out, which provided some comfort, but he used the CPAP since he was laying flat he experienced congestion, a gravelly voice, and coughing up green mucus, which he suspects may be related to sinus issues or dental problems. Using CPAP without water results in a dry nose, while using it with water makes him feel like he is 'drowning'.  He has a history of sinus issues and uses AYR nasal spray with aloe, Flonase, and recently purchased Nasonex, though he has not started using it yet as he wants to finish out his Flonase supply.  He uses air purifiers in his home, which he believes have reduced his need for a rescue inhaler. He reports mold exposure in his home and notes that allergy  testing showed mold as his only allergen. He takes Zyrtec , which he feels dries him out.  He has not been performing nasal saline rinses.  He mentions a significant weight gain of 15-20 pounds since the onset of COVID, which he attributes to fluid retention. Avoiding CPAP use for several days reduces swelling in his legs and feet..  Review of systems: 10pt ROS negative unless noted above in HPI   Past medical/social/surgical/family history have been reviewed and are unchanged unless specifically  indicated below.  No changes  Medication List: Current Outpatient Medications  Medication Sig Dispense Refill   acetaminophen (TYLENOL) 500 MG tablet Take 500 mg by mouth every 6 (six) hours as needed.     AIRSUPRA  90-80 MCG/ACT AERO INHALE 1 PUFF ONCE DAILY MAY  INCREASE  TO  2  PUFFS  ONCE  A  DAY  AS  NEEDED (Patient taking differently: PRN) 11 g 2   AIRSUPRA  90-80 MCG/ACT AERO Inhale 2 puffs into the lungs every 4 (four) hours as needed. 10.7 g 1   albuterol  (VENTOLIN  HFA) 108 (90 Base) MCG/ACT inhaler Inhale 2 puffs into the lungs every 4 (four) hours as needed.     aspirin EC 81 MG tablet Take 81 mg by mouth daily. Swallow whole.     Budesonide  90 MCG/ACT inhaler Inhale 1-2 puffs into the lungs 2 (two) times daily. 1 each 3   carvedilol  (COREG ) 12.5 MG tablet Take 1 tablet (12.5 mg total) by mouth 2 (two) times daily with a meal. 180 tablet 3   cetirizine  (ZYRTEC ) 10 MG tablet Take 10 mg by mouth daily.     famotidine  (PEPCID ) 20 MG tablet Take 1/2 (one-half) tablet by mouth twice daily 90 tablet 0   fluticasone (FLONASE) 50 MCG/ACT nasal spray Place 1 spray into both nostrils daily.     ibuprofen  (ADVIL ) 200 MG tablet Take 2 tablets (400 mg total) by mouth 2 (two) times daily as needed. 60 tablet 1   ipratropium (ATROVENT)  0.03 % nasal spray Place 2 sprays into both nostrils.     levalbuterol  (XOPENEX  HFA) 45 MCG/ACT inhaler Inhale 2 puffs into the lungs every 4 (four) hours as needed for wheezing. 15 g 1   levalbuterol  (XOPENEX ) 0.63 MG/3ML nebulizer solution Take 3 mLs (0.63 mg total) by nebulization 4 (four) times daily as needed for wheezing or shortness of breath. 75 mL 1   simvastatin  (ZOCOR ) 40 MG tablet Take 1 tablet (40 mg total) by mouth at bedtime. 90 tablet 3   valsartan  (DIOVAN ) 160 MG tablet Take 1 tablet (160 mg total) by mouth daily. 90 tablet 3   No current facility-administered medications for this visit.     Known medication allergies: Allergies  Allergen  Reactions   Ace Inhibitors Cough   Advair Diskus [Fluticasone-Salmeterol] Other (See Comments)    Confusion and paranoid   Codeine Nausea Only    REACTION: nausea   Gabapentin  Other (See Comments)    Facial and left shoulder numbness   Montelukast Sodium Other (See Comments)    Confusion and paranoid   Prednisone Other (See Comments)    Confusion and paranoid   Prednisolone Anxiety     Physical examination: Pulse 73, temperature 98.1 F (36.7 C), SpO2 97%.  General: Alert, interactive, in no acute distress. HEENT: PERRLA, TMs pearly gray, turbinates minimally edematous without discharge, post-pharynx non erythematous. Neck: Supple without lymphadenopathy. Lungs: Clear to auscultation without wheezing, rhonchi or rales. {no increased work of breathing. CV: Normal S1, S2 without murmurs. Abdomen: Nondistended, nontender. Skin: Warm and dry, without lesions or rashes. Extremities:  No clubbing, cyanosis or edema. Neuro:   Grossly intact.  Diagnostics/Labs:  Spirometry: FEV1: 2.07 L or 52%, FVC: 2.79 L or 53%, ratio consistent with restrictive pattern however this is an of a slightly improved study from previous  Assessment and plan: Moderate persistent asthma/RADS - based on your history appears you have had RADS (reactive airway disease syndrome) events where exposures to large amounts of toxic/noxious chemicals can cause asthma symptoms - Rescue medications and if needed prior to physical activity:  AirSupra  (if having respiratory illness or flare-up) 2 puffs as needed every 4 hours otherwise use Xopenex  2 puffs or Xopenex  1 vial via nebulizer 10-15 minutes before physical activity.  - Asthma control goals:  * Full participation in all desired activities (may need albuterol  before activity) * Albuterol  use two time or less a week on average (not counting use with activity) * Cough interfering with sleep two time or less a month * Oral steroids no more than once a year * No  hospitalizations  Allergic rhinitis with conjunctivitis - Continue avoidance measures for mold - Continue Zyrtec  and Flonase for now.  If you are noting less benefit after use then would change to Xyzal and Nasonex. - You can use an extra dose of the antihistamine, if needed, for breakthrough symptoms.  - Recommend nasal saline rinses with NAVAGE machine 1-7 times a week to remove allergens from the nasal cavities as well as help with mucous clearance (this is especially helpful to do before the nasal sprays are given).   - Continue use of air purifier in the home  - Common food allergy  testing was negative   Follow-up in 6 months for routine visit   I appreciate the opportunity to take part in Bryndan's care. Please do not hesitate to contact me with questions.  Sincerely,   Danita Brain, MD Allergy /Immunology [facility]

## 2023-10-14 NOTE — Patient Instructions (Addendum)
-   based on your history appears you have had RADS (reactive airway disease syndrome) events where exposures to large amounts of toxic/noxious chemicals can cause asthma symptoms - Rescue medications and if needed prior to physical activity:  AirSupra  (if having respiratory illness or flare-up) 2 puffs as needed every 4 hours otherwise use Xopenex  2 puffs or Xopenex  1 vial via nebulizer 10-15 minutes before physical activity.  - Asthma control goals:  * Full participation in all desired activities (may need albuterol  before activity) * Albuterol  use two time or less a week on average (not counting use with activity) * Cough interfering with sleep two time or less a month * Oral steroids no more than once a year * No hospitalizations  - Continue avoidance measures for mold - Continue Zyrtec  and Flonase for now.  If you are noting less benefit after use then would change to Xyzal and Nasonex. - You can use an extra dose of the antihistamine, if needed, for breakthrough symptoms.  - Recommend nasal saline rinses with NAVAGE machine 1-7 times a week to remove allergens from the nasal cavities as well as help with mucous clearance (this is especially helpful to do before the nasal sprays are given).   - Continue use of air purifier in the home  - Common food allergy  testing was negative   Follow-up in 6 months for routine visit

## 2024-01-20 ENCOUNTER — Encounter: Payer: Self-pay | Admitting: Student in an Organized Health Care Education/Training Program

## 2024-01-20 ENCOUNTER — Ambulatory Visit: Admitting: Student in an Organized Health Care Education/Training Program

## 2024-01-20 DIAGNOSIS — I10 Essential (primary) hypertension: Secondary | ICD-10-CM | POA: Diagnosis not present

## 2024-01-20 DIAGNOSIS — Z6841 Body Mass Index (BMI) 40.0 and over, adult: Secondary | ICD-10-CM | POA: Diagnosis not present

## 2024-01-20 DIAGNOSIS — R7303 Prediabetes: Secondary | ICD-10-CM | POA: Diagnosis not present

## 2024-01-20 DIAGNOSIS — E7800 Pure hypercholesterolemia, unspecified: Secondary | ICD-10-CM

## 2024-01-20 DIAGNOSIS — R600 Localized edema: Secondary | ICD-10-CM | POA: Diagnosis not present

## 2024-01-20 DIAGNOSIS — Z23 Encounter for immunization: Secondary | ICD-10-CM | POA: Diagnosis not present

## 2024-01-20 LAB — LIPID PANEL
Cholesterol: 137 mg/dL (ref 28–200)
HDL: 36.9 mg/dL — ABNORMAL LOW
LDL Cholesterol: 62 mg/dL (ref 10–99)
NonHDL: 100.35
Total CHOL/HDL Ratio: 4
Triglycerides: 191 mg/dL — ABNORMAL HIGH (ref 10.0–149.0)
VLDL: 38.2 mg/dL (ref 0.0–40.0)

## 2024-01-20 LAB — HEMOGLOBIN A1C: Hgb A1c MFr Bld: 6.7 % — ABNORMAL HIGH (ref 4.6–6.5)

## 2024-01-20 LAB — COMPREHENSIVE METABOLIC PANEL WITH GFR
ALT: 43 U/L (ref 3–53)
AST: 48 U/L — ABNORMAL HIGH (ref 5–37)
Albumin: 3.8 g/dL (ref 3.5–5.2)
Alkaline Phosphatase: 85 U/L (ref 39–117)
BUN: 14 mg/dL (ref 6–23)
CO2: 26 meq/L (ref 19–32)
Calcium: 9.1 mg/dL (ref 8.4–10.5)
Chloride: 105 meq/L (ref 96–112)
Creatinine, Ser: 0.9 mg/dL (ref 0.40–1.50)
GFR: 89.92 mL/min
Glucose, Bld: 123 mg/dL — ABNORMAL HIGH (ref 70–99)
Potassium: 3.7 meq/L (ref 3.5–5.1)
Sodium: 139 meq/L (ref 135–145)
Total Bilirubin: 0.6 mg/dL (ref 0.2–1.2)
Total Protein: 7.2 g/dL (ref 6.0–8.3)

## 2024-01-20 NOTE — Progress Notes (Signed)
 "  Established Patient Office Visit  Patient ID: Eduardo Duncan, male    DOB: 18-Oct-1960  Age: 64 y.o. MRN: [id number] PCP: Jerrell Eduardo Ned, MD  Chief Complaint  Patient presents with   Medical Management of Chronic Issues    6 month follow up     Subjective:     HPI  Discussed the use of AI scribe software for clinical note transcription with the patient, who gave verbal consent to proceed.  History of Present Illness Eduardo Duncan is a 65 year old male who presents for routine follow-up.  He has been experiencing a low mood for the last couple of months, which he attributes to personal and financial stressors, including issues with his truck and changes in his Medicaid status. He has a history of depression and previously attended therapy while living in a camper. He has gained 10 pounds since July, now weighing 425 pounds, and attributes this to a lack of physical activity.  He reports occasional forgetfulness in taking his medications, missing doses three times in the last six to eight weeks despite using a double seven-day AM/PM pill organizer.  He experiences intermittent breathing difficulties and has used his inhaler recently. He also reports having severe earaches and sinus issues. No chest pain. He has a history of asthma and uses an inhaler as needed.  He describes two recent falls where he caught himself, attributing one incident to tripping over a rug. He reports that his muscle memory and physical prowess helped him avoid injury. He has had neck and upper back pain with numbness in his hands, which improved after one of the falls when his back 'popped real loud'.  He has a history of lymphedema, which he associates with skin changes described as 'alligator skin'. He notes these changes began after starting medications in 2010, including simvastatin .  He mentions a family history of early deaths, with his father dying at 90 and his brother at 24. He expresses  concern about his own longevity, noting he will be 66 in three years.     Objective:     BP 119/66   Pulse 78   Wt (!) 425 lb (192.8 kg)   SpO2 96%   BMI 53.12 kg/m   Physical Exam  Gen: Well-appearing man Neck: Increased circumference, normal thyroid , no nodules or adenopathy Heart: Regular, no murmur Lungs: Unlabored, clear throughout Ext: 2+ chronic lymphedema in both lower extremities, pitting edema, chronic stasis changes in both legs, no skin breakdown or ulcerations.    Assessment & Plan:   Problem List Items Addressed This Visit       High   Morbid obesity (HCC) - Primary (Chronic)   Weight currently is 425 pounds with a BMI 53.  His weight has increased by 10 pounds since July. Physical activity is limited due to transportation issues and personal circumstances.  We talked about nutrition changes that he could make in the future.  At risk for metabolic syndrome and other complications including lymphedema in both of his legs.  Depressed mood also contributing.  Very difficult situation to manage.  Would be a good candidate for GLP-1 agonist therapy, but difficult to access this right now.      Relevant Orders   Comprehensive metabolic panel with GFR   Essential hypertension (Chronic)   Blood pressure is well-controlled at 119/66 mmHg using a wrist cuff. No recent chest pain or significant issues. Continue current antihypertensive regimen.  Continue valsartan  and carvedilol .  Relevant Orders   Comprehensive metabolic panel with GFR     Medium    Bilateral lower extremity edema (Chronic)   Chronic bilateral lower extremity edema, looks like lymphedema due to obesity, with chronic stasis changes.  Thankfully no skin breakdown at this time but he is at risk for it.  He is unable to place compression stockings.  Very limited on his mobility.  Unable to elevate his legs because of obesity.  Very difficult situation.  Legs are at high risk for skin breakdown in the  future.       Prediabetes (Chronic)   Relevant Orders   Hemoglobin A1c   Hyperlipidemia (Chronic)   Relevant Orders   Lipid panel     Return in about 6 months (around 07/19/2024).    Eduardo Debby Specking, MD [facility]   "

## 2024-01-20 NOTE — Assessment & Plan Note (Signed)
 Blood pressure is well-controlled at 119/66 mmHg using a wrist cuff. No recent chest pain or significant issues. Continue current antihypertensive regimen.  Continue valsartan  and carvedilol .

## 2024-01-20 NOTE — Assessment & Plan Note (Signed)
 Chronic bilateral lower extremity edema, looks like lymphedema due to obesity, with chronic stasis changes.  Thankfully no skin breakdown at this time but he is at risk for it.  He is unable to place compression stockings.  Very limited on his mobility.  Unable to elevate his legs because of obesity.  Very difficult situation.  Legs are at high risk for skin breakdown in the future.

## 2024-01-20 NOTE — Patient Instructions (Signed)
" °  VISIT SUMMARY: During your visit, we discussed several health concerns including your weight, blood pressure, lymphedema, and general health maintenance. We also addressed your recent low mood, breathing difficulties, and neck and back pain.  YOUR PLAN: -MORBID OBESITY: Morbid obesity means having an excessively high body weight. Your weight has increased by 10 pounds since July, now at 425 pounds. We discussed the importance of increasing physical activity despite your transportation issues and personal circumstances.  -ESSENTIAL HYPERTENSION: Essential hypertension is high blood pressure with no identifiable cause. Your blood pressure is well-controlled at 119/66 mmHg using a wrist cuff. Continue your current antihypertensive regimen.  -BILATERAL LOWER EXTREMITY LYMPHEDEMA: Lymphedema is swelling in the legs due to fluid buildup. You have chronic lymphedema with skin changes resembling alligator skin. We will continue to monitor this condition.  -GENERAL HEALTH MAINTENANCE: We administered an updated pneumococcal vaccine. Your last Cologuard test was normal, and the next one is due in three years. You have not received a flu shot yet. Blood work was ordered to check your blood sugars, A1c, kidney function, electrolytes, and cholesterol.  INSTRUCTIONS: Please continue taking your medications as prescribed and try to use your pill organizer to avoid missing doses. Increase your physical activity as much as possible. Follow up with the blood work as ordered and get your flu shot soon. If you experience any new or worsening symptoms, please contact our office.   "

## 2024-01-20 NOTE — Addendum Note (Signed)
 Addended by: RANDINE HOVE R on: 01/20/2024 02:08 PM   Modules accepted: Orders

## 2024-01-20 NOTE — Assessment & Plan Note (Signed)
 Weight currently is 425 pounds with a BMI 53.  His weight has increased by 10 pounds since July. Physical activity is limited due to transportation issues and personal circumstances.  We talked about nutrition changes that he could make in the future.  At risk for metabolic syndrome and other complications including lymphedema in both of his legs.  Depressed mood also contributing.  Very difficult situation to manage.  Would be a good candidate for GLP-1 agonist therapy, but difficult to access this right now.

## 2024-01-23 ENCOUNTER — Ambulatory Visit: Payer: Self-pay | Admitting: Student in an Organized Health Care Education/Training Program

## 2024-04-19 ENCOUNTER — Ambulatory Visit: Admitting: Allergy

## 2024-07-20 ENCOUNTER — Ambulatory Visit: Admitting: Student in an Organized Health Care Education/Training Program
